# Patient Record
Sex: Female | Born: 1985 | Race: Black or African American | Hispanic: No | Marital: Married | Smoking: Never smoker
Health system: Southern US, Community
[De-identification: ages and names within clinical notes are randomized; demographics above are authoritative.]

## PROBLEM LIST (undated history)

## (undated) ENCOUNTER — Inpatient Hospital Stay (HOSPITAL_COMMUNITY): Payer: Self-pay

## (undated) DIAGNOSIS — F32A Depression, unspecified: Secondary | ICD-10-CM

## (undated) DIAGNOSIS — F209 Schizophrenia, unspecified: Secondary | ICD-10-CM

## (undated) DIAGNOSIS — D649 Anemia, unspecified: Secondary | ICD-10-CM

## (undated) DIAGNOSIS — F329 Major depressive disorder, single episode, unspecified: Secondary | ICD-10-CM

## (undated) HISTORY — PX: NO PAST SURGERIES: SHX2092

---

## 2015-06-01 NOTE — L&D Delivery Note (Signed)
Delivery Note At 8:07 AM a viable female was delivered via Vaginal, Spontaneous Delivery (Presentation: vertex; LOA).  APGAR: 9, 9; weight pending.   Placenta status: spontaneous, intact.  Cord: 3VC with the following complications: none.  Anesthesia:   Episiotomy: None Lacerations: 1st degree Suture Repair: 3.0 vicryl Est. Blood Loss (mL): 150  Mom to postpartum.  Baby to Couplet care / Skin to Skin.  STINSON, JACOB JEHIEL 04/20/2016, 8:41 AM

## 2015-08-15 ENCOUNTER — Inpatient Hospital Stay (HOSPITAL_COMMUNITY)
Admission: AD | Admit: 2015-08-15 | Discharge: 2015-08-16 | Disposition: A | Discharge status: Home / Self Care | Payer: Medicaid Other | Source: Ambulatory Visit | Attending: Obstetrics & Gynecology | Admitting: Obstetrics & Gynecology

## 2015-08-15 ENCOUNTER — Ambulatory Visit (INDEPENDENT_AMBULATORY_CARE_PROVIDER_SITE_OTHER): Payer: Self-pay | Admitting: Physician Assistant

## 2015-08-15 ENCOUNTER — Inpatient Hospital Stay (HOSPITAL_COMMUNITY): Payer: Medicaid Other

## 2015-08-15 ENCOUNTER — Encounter (HOSPITAL_COMMUNITY): Payer: Self-pay | Admitting: *Deleted

## 2015-08-15 VITALS — BP 120/80 | HR 105 | Temp 99.0°F | Resp 16 | Ht 62.0 in | Wt 122.0 lb

## 2015-08-15 DIAGNOSIS — O99341 Other mental disorders complicating pregnancy, first trimester: Secondary | ICD-10-CM | POA: Diagnosis not present

## 2015-08-15 DIAGNOSIS — R109 Unspecified abdominal pain: Secondary | ICD-10-CM | POA: Insufficient documentation

## 2015-08-15 DIAGNOSIS — F209 Schizophrenia, unspecified: Secondary | ICD-10-CM | POA: Diagnosis not present

## 2015-08-15 DIAGNOSIS — O0000 Abdominal pregnancy without intrauterine pregnancy: Secondary | ICD-10-CM

## 2015-08-15 DIAGNOSIS — O26891 Other specified pregnancy related conditions, first trimester: Secondary | ICD-10-CM | POA: Insufficient documentation

## 2015-08-15 DIAGNOSIS — F458 Other somatoform disorders: Secondary | ICD-10-CM

## 2015-08-15 DIAGNOSIS — Z3A01 Less than 8 weeks gestation of pregnancy: Secondary | ICD-10-CM | POA: Diagnosis not present

## 2015-08-15 DIAGNOSIS — O3680X0 Pregnancy with inconclusive fetal viability, not applicable or unspecified: Secondary | ICD-10-CM

## 2015-08-15 DIAGNOSIS — R319 Hematuria, unspecified: Secondary | ICD-10-CM

## 2015-08-15 DIAGNOSIS — R1084 Generalized abdominal pain: Secondary | ICD-10-CM

## 2015-08-15 DIAGNOSIS — N912 Amenorrhea, unspecified: Secondary | ICD-10-CM

## 2015-08-15 DIAGNOSIS — O26899 Other specified pregnancy related conditions, unspecified trimester: Secondary | ICD-10-CM

## 2015-08-15 HISTORY — DX: Anemia, unspecified: D64.9

## 2015-08-15 HISTORY — DX: Major depressive disorder, single episode, unspecified: F32.9

## 2015-08-15 HISTORY — DX: Depression, unspecified: F32.A

## 2015-08-15 HISTORY — DX: Schizophrenia, unspecified: F20.9

## 2015-08-15 LAB — CBC
HEMATOCRIT: 36.9 % (ref 36.0–46.0)
HEMOGLOBIN: 12.7 g/dL (ref 12.0–15.0)
MCH: 31.6 pg (ref 26.0–34.0)
MCHC: 34.4 g/dL (ref 30.0–36.0)
MCV: 91.8 fL (ref 78.0–100.0)
Platelets: 299 10*3/uL (ref 150–400)
RBC: 4.02 MIL/uL (ref 3.87–5.11)
RDW: 12.1 % (ref 11.5–15.5)
WBC: 16.2 10*3/uL — ABNORMAL HIGH (ref 4.0–10.5)

## 2015-08-15 LAB — POCT URINALYSIS DIP (MANUAL ENTRY)
BILIRUBIN UA: NEGATIVE
Blood, UA: NEGATIVE
GLUCOSE UA: NEGATIVE
Ketones, POC UA: NEGATIVE
LEUKOCYTES UA: NEGATIVE
NITRITE UA: NEGATIVE
Protein Ur, POC: NEGATIVE
Spec Grav, UA: 1.015
UROBILINOGEN UA: 0.2
pH, UA: 7.5

## 2015-08-15 LAB — POC MICROSCOPIC URINALYSIS (UMFC): MUCUS RE: ABSENT

## 2015-08-15 LAB — WET PREP, GENITAL
Clue Cells Wet Prep HPF POC: NONE SEEN
SPERM: NONE SEEN
Trich, Wet Prep: NONE SEEN
Yeast Wet Prep HPF POC: NONE SEEN

## 2015-08-15 LAB — POCT URINE PREGNANCY: PREG TEST UR: POSITIVE — AB

## 2015-08-15 LAB — HCG, QUANTITATIVE, PREGNANCY: hCG, Beta Chain, Quant, S: 1331 m[IU]/mL — ABNORMAL HIGH (ref <5)

## 2015-08-15 NOTE — Progress Notes (Signed)
Amy Mcclain  MRN: [id number] DOB: 1985-10-24  Subjective:  Pt presents to clinic for a pregnancy test - she is with her husband and friend to interpret. She has a pregnancy test that showed a line at home.  She thinks she is pregnant and wants proof.  Remaining history was obtained through use of an interpretor service provided by our office.  States 18 months of irregular menstrual periods. Diarrhea started 10 days ago with self reported fever and suprapubic abdominal pain started 5 days ago with some nausea. Denies bright red blood per rectum. Temperature not taken at home. Denies dysuria, endorses polyuria. Denies sick contacts at home. Endorses blood and pus in urine at the start of the stream.  No one is sick at home with similar symptoms. Uses no pregnancy protection. Has chronic back pain that has not changed in the last days 10 days.  Pt believes she has been pregnant for 18 months and she has been trying to deliver for the last 5 days - she feels like the baby is in water.  She only wants to go to a public hospital because they do not know what they are doing at a private hospital and if I send her there she will leave.  LMP - 2/16  [id number] - 2010 C-section (healthyl 30 year old at home), 2013 spontaneous abortion at 6 months along  Used interpretor service which had a hard time with the patient's dialect.  Husband produces papers to show she was at [facility] on 3/14 and had a diagnosis of mental illness and schizophrenia.  She is on no medications at this time.  There are no active problems to display for this patient.   No current outpatient prescriptions on file prior to visit.   No current facility-administered medications on file prior to visit.    No Known Allergies  Review of Systems  Constitutional: Positive for fever (subjective). Negative for chills.  Gastrointestinal: Positive for abdominal pain.  Genitourinary: Positive for pelvic pain. Negative for dysuria and  vaginal discharge.  Musculoskeletal: Positive for back pain (chronic).   Objective:  BP 120/80 mmHg  Pulse 105  Temp(Src) 99 F (37.2 C) (Oral)  Resp 16  Ht  (1.575 m)  Wt 122 lb (55.339 kg)  BMI 22.31 kg/m2  SpO2 98%  Physical Exam  Constitutional: She is well-developed, well-nourished, and in no distress.  HENT:  Head: Normocephalic and atraumatic.  Right Ear: External ear normal.  Eyes: Conjunctivae are normal.  Cardiovascular: Normal rate, regular rhythm and normal heart sounds.   No murmur heard. Pulmonary/Chest: Effort normal and breath sounds normal. She has no wheezes.  Abdominal: Soft. Bowel sounds are normal. There is tenderness in the right lower quadrant, suprapubic area, left upper quadrant and left lower quadrant. There is no rigidity, no rebound, no guarding, no CVA tenderness and negative Murphy's sign.  Genitourinary: Vagina normal, uterus normal, cervix normal, right adnexa normal, left adnexa normal and vulva normal. Vagina exhibits normal mucosa. No vaginal discharge found.  Closed long cervix - when pt was distracted she had no CMT - no TTP during bimanual exam -     Results for orders placed or performed in visit on 08/15/15  POCT urine pregnancy  Result Value Ref Range   Preg Test, Ur Positive (A) Negative  POCT Microscopic Urinalysis ([facility])  Result Value Ref Range   WBC,UR,HPF,POC None None WBC/hpf   RBC,UR,HPF,POC None None RBC/hpf   Bacteria None None, Too numerous to count  Mucus Absent Absent   Epithelial Cells, UR Per Microscopy None None, Too numerous to count cells/hpf  POCT urinalysis dipstick  Result Value Ref Range   Color, UA yellow yellow   Clarity, UA clear clear   Glucose, UA negative negative   Bilirubin, UA negative negative   Ketones, POC UA negative negative   Spec Grav, UA 1.015    Blood, UA negative negative   pH, UA 7.5    Protein Ur, POC negative negative   Urobilinogen, UA 0.2    Nitrite, UA Negative Negative    Leukocytes, UA Negative Negative     Assessment and Plan :  Amenorrhea - Plan: POCT urinalysis dipstick  Blood in urine - Plan: POCT urine pregnancy, POCT Microscopic Urinalysis ([facility])  Generalized abdominal pain  Abdominal pregnancy, unspecified whether intrauterine pregnancy present  Delusion of pregnancy   [facility] - I question whether 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  She likely needs a dating US and lab work to determine her current situation further.  [facility]  Benny LennertSarah Weber PA-C  [facility] 08/15/2015 1:35 PM

## 2015-08-15 NOTE — MAU Note (Signed)
I have cramping in abdomen and pain in back for 5 days. Brownish vag discharge.

## 2015-08-15 NOTE — Patient Instructions (Addendum)
Please drive to the hospital now:  [facility] [address], [place], [place] [address] [phone]       [phone] [id number]  [address] Man[address] mus[address], [place], [place] [address]      IF you received an x-ray today, you will receive an invoice from [facility]. Please contact [facility] at [phone] with questions or concerns regarding your invoice.   IF you received labwork today, you will receive an invoice from [facility]. Please contact [facility] at [phone] with questions or concerns regarding your invoice.   Our billing staff will not be able to assist you with questions regarding bills from these companies.  You will be contacted with the lab results as soon as they are available. The fastest way to get your results is to activate your My Chart account. Instructions are located on the last page of this paperwork. If you have not heard from us regarding the results in 2 weeks, please contact this office.

## 2015-08-16 ENCOUNTER — Encounter (HOSPITAL_COMMUNITY): Payer: Self-pay | Admitting: Advanced Practice Midwife

## 2015-08-16 DIAGNOSIS — O26891 Other specified pregnancy related conditions, first trimester: Secondary | ICD-10-CM | POA: Diagnosis not present

## 2015-08-16 DIAGNOSIS — F209 Schizophrenia, unspecified: Secondary | ICD-10-CM | POA: Diagnosis present

## 2015-08-16 DIAGNOSIS — O9989 Other specified diseases and conditions complicating pregnancy, childbirth and the puerperium: Secondary | ICD-10-CM

## 2015-08-16 DIAGNOSIS — R109 Unspecified abdominal pain: Secondary | ICD-10-CM

## 2015-08-16 LAB — HIV ANTIBODY (ROUTINE TESTING W REFLEX): HIV SCREEN 4TH GENERATION: NONREACTIVE

## 2015-08-16 LAB — ABO/RH: ABO/RH(D): A POS

## 2015-08-16 MED ORDER — OXYCODONE-ACETAMINOPHEN 5-325 MG PO TABS
1.0000 | ORAL_TABLET | Freq: Once | ORAL | Status: AC
  Administered 2015-08-16: 1 via ORAL
  Filled 2015-08-16: qty 1

## 2015-08-16 NOTE — MAU Provider Note (Signed)
Chief Complaint: No chief complaint on file.   First Provider Initiated Contact with Patient 08/16/15 0011     SUBJECTIVE HPI: Amy Mcclain is a 30 y.o. [id number] at 10112w2d by LMP who presents to Maternity Admissions reporting low abd pain. Had positive pregnancy test and at Urgent Care tonight. See note. Pt states she is 18 months pregnancy, is in labor and was told that her cervix was open. States that she feels fetal movement. Her husband is at the bedside and states she has psychological problems and if off of her medicines.   Location: Low abd and right kidney (points to left low abd) Quality: "labor pains" Severity: Moderate Duration: 5 days Timing: intermittent Associated signs and symptoms: Gives changing reports of no bleeding since LMP, then reports that she did have some bleeding since then. None now. States she had blood in her urine severel days ago.    Pt gives very inconsistent Hx.   [place] Arabic interpreter used.   Past Medical History  Diagnosis Date  . Anemia   . Depression    OB History  Gravida Para Term Preterm AB SAB TAB Ectopic Multiple Living  4 3 2 1  0 0  0 0 2    # Outcome Date GA Lbr Len/2nd Weight Sex Delivery Anes PTL Lv  4 Current           3 Preterm         FD  2 Term           1 Term              History reviewed. No pertinent past surgical history. Social History   Social History  . Marital Status: Married    Spouse Name: N/A  . Number of Children: N/A  . Years of Education: N/A   Occupational History  . Not on file.   Social History Main Topics  . Smoking status: Never Smoker   . Smokeless tobacco: Never Used  . Alcohol Use: No  . Drug Use: No  . Sexual Activity: Yes   Other Topics Concern  . Not on file   Social History Narrative   No current facility-administered medications on file prior to encounter.   No current outpatient prescriptions on file prior to encounter.   Not on File  I have reviewed the past Medical Hx,  Surgical Hx, Social Hx, Allergies and Medications.   Review of Systems  Unable to perform ROS: Psychiatric disorder (Limited by )  Gastrointestinal: Positive for abdominal pain.  Genitourinary: Positive for hematuria and pelvic pain. Negative for vaginal bleeding.  Musculoskeletal: Positive for back pain.    OBJECTIVE Patient Vitals for the past 24 hrs:  BP Temp Pulse Resp Height Weight  08/15/15 2009 122/74 mmHg - 119 - - -  08/15/15 2001 - 98.3 F (36.8 C) - 18 5' 1.5" (1.562 m) 122 lb 3.2 oz (55.43 kg)   Constitutional: Well-developed, well-nourished female in no acute distress.  Cardiovascular: normal rate Respiratory: normal rate and effort.  GI: Abd soft, non-tender. Pos BS x 4 MS: Extremities nontender, no edema, normal ROM Neurologic: Alert and oriented x 4.  GU: Neg CVAT.  BIMANUAL EXAM: NEFG, physiologic discharge, no blood noted, cervix closed. Uterus normal size. No adnexal tenderness or masses. No CMT.  LAB RESULTS Results for orders placed or performed during the hospital encounter of 08/15/15 (from the past 24 hour(s))  hCG, quantitative, pregnancy     Status: Abnormal   Collection Time:  08/15/15  8:57 PM  Result Value Ref Range   hCG, Beta Chain, Quant, S 1331 (H) <5 mIU/mL  CBC     Status: Abnormal   Collection Time: 08/15/15  8:57 PM  Result Value Ref Range   WBC 16.2 (H) 4.0 - 10.5 K/uL   RBC 4.02 3.87 - 5.11 MIL/uL   Hemoglobin 12.7 12.0 - 15.0 g/dL   HCT 16.1 09.6 - 04.5 %   MCV 91.8 78.0 - 100.0 fL   MCH 31.6 26.0 - 34.0 pg   MCHC 34.4 30.0 - 36.0 g/dL   RDW 40.9 81.1 - 91.4 %   Platelets 299 150 - 400 K/uL  ABO/Rh     Status: None   Collection Time: 08/15/15  8:57 PM  Result Value Ref Range   ABO/RH(D) A POS   Wet prep, genital     Status: Abnormal   Collection Time: 08/15/15 11:41 PM  Result Value Ref Range   Yeast Wet Prep HPF POC NONE SEEN NONE SEEN   Trich, Wet Prep NONE SEEN NONE SEEN   Clue Cells Wet Prep HPF POC NONE SEEN NONE SEEN    WBC, Wet Prep HPF POC MODERATE (A) NONE SEEN   Sperm NONE SEEN     IMAGING US Ob Comp Less 14 Wks  08/15/2015  CLINICAL DATA:  Abdomen pain and back pain.  Vaginal discharge. EXAM: OBSTETRIC <14 WK [place] AND TRANSVAGINAL OB US TECHNIQUE: Both transabdominal and transvaginal ultrasound examinations were performed for complete evaluation of the gestation as well as the maternal uterus, adnexal regions, and pelvic cul-de-sac. Transvaginal technique was performed to assess early pregnancy. COMPARISON:  None. FINDINGS: Intrauterine gestational sac: Tiny intrauterine sac, too small to characterize. Yolk sac:  No Embryo:  No Cardiac Activity: No Heart Rate:   bpm MSD: 2.1  mm   4 w   6  d CRL:    mm    w    d                  [place] EDC: Subchorionic hemorrhage:  None visualized. Maternal uterus/adnexae: Normal. Probable corpus luteal cyst on the right. IMPRESSION: 2 mm intrauterine sac, too small to characterize. No yolk sac, fetal pole, or cardiac activity yet visualized. Recommend follow-up quantitative B-HCG levels and follow-up US in 14 days to confirm and assess viability. This recommendation follows SRU consensus guidelines: Diagnostic Criteria for Nonviable Pregnancy Early in the First Trimester. Malva Limes Med 2013; 782:9562-13. Electronically Signed   By: Ellery Plunk M.D.   On: 08/15/2015 23:12   US Ob Transvaginal  08/15/2015  CLINICAL DATA:  Abdomen pain and back pain.  Vaginal discharge. EXAM: OBSTETRIC <14 WK [place] AND TRANSVAGINAL OB US TECHNIQUE: Both transabdominal and transvaginal ultrasound examinations were performed for complete evaluation of the gestation as well as the maternal uterus, adnexal regions, and pelvic cul-de-sac. Transvaginal technique was performed to assess early pregnancy. COMPARISON:  None. FINDINGS: Intrauterine gestational sac: Tiny intrauterine sac, too small to characterize. Yolk sac:  No Embryo:  No Cardiac Activity: No Heart Rate:   bpm MSD: 2.1  mm   4 w   6  d CRL:    mm     w    d                  [place] EDC: Subchorionic hemorrhage:  None visualized. Maternal uterus/adnexae: Normal. Probable corpus luteal cyst on the right. IMPRESSION: 2 mm intrauterine sac, too small to  characterize. No yolk sac, fetal pole, or cardiac activity yet visualized. Recommend follow-up quantitative B-HCG levels and follow-up US in 14 days to confirm and assess viability. This recommendation follows SRU consensus guidelines: Diagnostic Criteria for Nonviable Pregnancy Early in the First Trimester. Malva Limes Med 2013; 782:9562-13. Electronically Signed   By: Ellery Plunk M.D.   On: 08/15/2015 23:12    MAU COURSE CBC, Quant, ABO/Rh, ultrasound, wet prep and GC/chlamydia culture, UA  MDM - Pain and bleeding in early pregnancy with pregnancy of unknown anatomic location, but hemodynamically stable.  - UnTx'd schizophrenia. No danger to self or others.   ASSESSMENT 1. Pregnancy of unknown anatomic location   2. Abdominal pain affecting pregnancy, antepartum     PLAN Discharge home in stable condition w/ husband who is deemed reliable to bring pt back for F/U labs or emergencies. . Ectopic and SAB precautions. Has appt scheduled for [facility] on 08/18/15 for psychiatric care.  Follow-up Information    Follow up with [facility] On 08/17/2015.   Why:  For repeat blood work or sooner as if symptoms worsen   Contact information:   [address] [id number] mc [place] [address] [phone]       Medication List    TAKE these medications        folic acid 400 MCG tablet  Commonly known as:  FOLVITE  Take 400 mcg by mouth daily.       [place], CNM 08/16/2015  12:51 AM  4

## 2015-08-16 NOTE — Discharge Instructions (Signed)

## 2015-08-16 NOTE — MAU Note (Signed)
Pt feeling better up walking in room. Agreeable to get dressed and go home. Instructed Husband to bring her back on Sunday for blood work or sooner for increased pain or if she starts to have vag bleeding.

## 2015-08-16 NOTE — MAU Note (Signed)
Pt still not wanting to go home. Had phone interpurter explain again why she needed to go home and come back in 2 days for blood work to check hormone level and pregnancy. Pt still insisting that she is 18 month pregnant and does not want to go home because of the pains she is having may come back.  Wants to know why we cannot tell her why she is having pain. Tried to explain that what we see so far is ok but we need further test and studies to make sure. Pt still not wanting to go home and then started to make her self vomit.  Notified provider of pts statements.

## 2015-08-17 ENCOUNTER — Inpatient Hospital Stay (HOSPITAL_COMMUNITY)
Admission: AD | Admit: 2015-08-17 | Discharge: 2015-08-17 | Disposition: A | Discharge status: Home / Self Care | Payer: Medicaid Other | Source: Ambulatory Visit | Attending: Obstetrics and Gynecology | Admitting: Obstetrics and Gynecology

## 2015-08-17 DIAGNOSIS — O3680X0 Pregnancy with inconclusive fetal viability, not applicable or unspecified: Secondary | ICD-10-CM

## 2015-08-17 DIAGNOSIS — O99341 Other mental disorders complicating pregnancy, first trimester: Secondary | ICD-10-CM | POA: Insufficient documentation

## 2015-08-17 DIAGNOSIS — O26891 Other specified pregnancy related conditions, first trimester: Secondary | ICD-10-CM | POA: Insufficient documentation

## 2015-08-17 DIAGNOSIS — R109 Unspecified abdominal pain: Secondary | ICD-10-CM

## 2015-08-17 DIAGNOSIS — Z3A01 Less than 8 weeks gestation of pregnancy: Secondary | ICD-10-CM | POA: Diagnosis not present

## 2015-08-17 DIAGNOSIS — F329 Major depressive disorder, single episode, unspecified: Secondary | ICD-10-CM | POA: Insufficient documentation

## 2015-08-17 DIAGNOSIS — O9989 Other specified diseases and conditions complicating pregnancy, childbirth and the puerperium: Secondary | ICD-10-CM

## 2015-08-17 LAB — HCG, QUANTITATIVE, PREGNANCY: HCG, BETA CHAIN, QUANT, S: 3031 m[IU]/mL — AB (ref <5)

## 2015-08-17 NOTE — MAU Note (Signed)
Pt presents for follow up HCG

## 2015-08-17 NOTE — MAU Provider Note (Signed)
History   [id number]   No chief complaint on file.   HPI Amy [id number] Amy Mcclain is a 30 y.o. female [phone] here for follow-up BHCG.  Upon review of the records patient was first seen on 08/15/15 for lower abdominal pain.   BHCG on that day was 1331.  Ultrasound showed IUGS, no yolk sac or fetal pole.  Wet prep collected.  Results were negative.  Pt here today with no report of abdominal pain or vaginal bleeding.   All other systems negative.   Patient's last menstrual period was 07/17/2015.  OB History  Gravida Para Term Preterm AB SAB TAB Ectopic Multiple Living  4 3 2 1  0 0  0 0 2    # Outcome Date GA Lbr Len/2nd Weight Sex Delivery Anes PTL Lv  4 Current           3 Preterm         FD  2 Term           1 Term               Past Medical History  Diagnosis Date  . Anemia   . Depression   . Schizophrenia (HCC)     No family history on file.  Social History   Social History  . Marital Status: Married    Spouse Name: N/A  . Number of Children: N/A  . Years of Education: N/A   Social History Main Topics  . Smoking status: Never Smoker   . Smokeless tobacco: Never Used  . Alcohol Use: No  . Drug Use: No  . Sexual Activity: Yes   Other Topics Concern  . Not on file   Social History Narrative    Not on File  No current facility-administered medications on file prior to encounter.   Current Outpatient Prescriptions on File Prior to Encounter  Medication Sig Dispense Refill  . folic acid (FOLVITE) 400 MCG tablet Take 400 mcg by mouth daily.       Physical Exam   There were no vitals filed for this visit.  Physical Exam  Constitutional: She is oriented to person, place, and time. She appears well-developed and well-nourished. No distress.  HENT:  Head: Normocephalic.  Neck: Neck supple.  Respiratory: Effort normal and breath sounds normal.  Neurological: She is alert and oriented to person, place, and time. She has normal reflexes.  Skin: Skin is warm and dry.   Psychiatric: She has a normal mood and affect.    MAU Course  Procedures  MDM Results for orders placed or performed during the hospital encounter of 08/17/15 (from the past 24 hour(s))  hCG, quantitative, pregnancy     Status: Abnormal   Collection Time: 08/17/15  8:06 PM  Result Value Ref Range   hCG, Beta Chain, Quant, S 3031 (H) <5 mIU/mL     Assessment and Plan  29 y.o. [name] at 6056w3d wks Pregnancy Follow-up BHCG Pregnancy of Unknown Location  Plan: Discharge to home Follow-up ultrasound in 10 days Ectopic precautions given All information conveyed via pacific interpreter line  Marlis EdelsonWalidah N Karim, CNM 08/17/2015 10:00 PM

## 2015-08-18 ENCOUNTER — Encounter (HOSPITAL_COMMUNITY): Payer: Self-pay | Admitting: *Deleted

## 2015-08-18 ENCOUNTER — Emergency Department (HOSPITAL_COMMUNITY)
Admission: EM | Admit: 2015-08-18 | Discharge: 2015-08-20 | Payer: Medicaid Other | Attending: Emergency Medicine | Admitting: Emergency Medicine

## 2015-08-18 DIAGNOSIS — Z3201 Encounter for pregnancy test, result positive: Secondary | ICD-10-CM

## 2015-08-18 DIAGNOSIS — O99341 Other mental disorders complicating pregnancy, first trimester: Secondary | ICD-10-CM | POA: Insufficient documentation

## 2015-08-18 DIAGNOSIS — O9989 Other specified diseases and conditions complicating pregnancy, childbirth and the puerperium: Secondary | ICD-10-CM | POA: Diagnosis present

## 2015-08-18 DIAGNOSIS — O99011 Anemia complicating pregnancy, first trimester: Secondary | ICD-10-CM | POA: Insufficient documentation

## 2015-08-18 DIAGNOSIS — R1032 Left lower quadrant pain: Secondary | ICD-10-CM | POA: Diagnosis not present

## 2015-08-18 DIAGNOSIS — Z0289 Encounter for other administrative examinations: Secondary | ICD-10-CM | POA: Diagnosis not present

## 2015-08-18 DIAGNOSIS — Z79899 Other long term (current) drug therapy: Secondary | ICD-10-CM | POA: Insufficient documentation

## 2015-08-18 DIAGNOSIS — Z046 Encounter for general psychiatric examination, requested by authority: Secondary | ICD-10-CM

## 2015-08-18 DIAGNOSIS — F22 Delusional disorders: Secondary | ICD-10-CM | POA: Insufficient documentation

## 2015-08-18 DIAGNOSIS — R1013 Epigastric pain: Secondary | ICD-10-CM | POA: Insufficient documentation

## 2015-08-18 DIAGNOSIS — R451 Restlessness and agitation: Secondary | ICD-10-CM | POA: Diagnosis not present

## 2015-08-18 DIAGNOSIS — F333 Major depressive disorder, recurrent, severe with psychotic symptoms: Secondary | ICD-10-CM | POA: Diagnosis present

## 2015-08-18 DIAGNOSIS — Z3A01 Less than 8 weeks gestation of pregnancy: Secondary | ICD-10-CM | POA: Diagnosis not present

## 2015-08-18 LAB — CBC WITH DIFFERENTIAL/PLATELET
BASOS ABS: 0.1 10*3/uL (ref 0.0–0.1)
BASOS PCT: 0 %
EOS PCT: 1 %
Eosinophils Absolute: 0.1 10*3/uL (ref 0.0–0.7)
HCT: 36 % (ref 36.0–46.0)
Hemoglobin: 12.4 g/dL (ref 12.0–15.0)
LYMPHS PCT: 16 %
Lymphs Abs: 2.3 10*3/uL (ref 0.7–4.0)
MCH: 31.1 pg (ref 26.0–34.0)
MCHC: 34.4 g/dL (ref 30.0–36.0)
MCV: 90.2 fL (ref 78.0–100.0)
Monocytes Absolute: 0.8 10*3/uL (ref 0.1–1.0)
Monocytes Relative: 5 %
Neutro Abs: 11.6 10*3/uL — ABNORMAL HIGH (ref 1.7–7.7)
Neutrophils Relative %: 78 %
PLATELETS: 268 10*3/uL (ref 150–400)
RBC: 3.99 MIL/uL (ref 3.87–5.11)
RDW: 11.7 % (ref 11.5–15.5)
WBC: 14.9 10*3/uL — AB (ref 4.0–10.5)

## 2015-08-18 LAB — SALICYLATE LEVEL

## 2015-08-18 LAB — COMPREHENSIVE METABOLIC PANEL
ALBUMIN: 4.2 g/dL (ref 3.5–5.0)
ALT: 18 U/L (ref 14–54)
AST: 19 U/L (ref 15–41)
Alkaline Phosphatase: 48 U/L (ref 38–126)
Anion gap: 9 (ref 5–15)
BUN: 12 mg/dL (ref 6–20)
CHLORIDE: 107 mmol/L (ref 101–111)
CO2: 22 mmol/L (ref 22–32)
CREATININE: 0.54 mg/dL (ref 0.44–1.00)
Calcium: 9.1 mg/dL (ref 8.9–10.3)
GFR calc Af Amer: >60 mL/min (ref >60)
GLUCOSE: 111 mg/dL — AB (ref 65–99)
Potassium: 4.3 mmol/L (ref 3.5–5.1)
SODIUM: 138 mmol/L (ref 135–145)
Total Bilirubin: 0.4 mg/dL (ref 0.3–1.2)
Total Protein: 7.3 g/dL (ref 6.5–8.1)

## 2015-08-18 LAB — HCG, QUANTITATIVE, PREGNANCY: hCG, Beta Chain, Quant, S: 3621 m[IU]/mL — ABNORMAL HIGH (ref <5)

## 2015-08-18 LAB — GC/CHLAMYDIA PROBE AMP (~~LOC~~) NOT AT ARMC
CHLAMYDIA, DNA PROBE: NEGATIVE
NEISSERIA GONORRHEA: NEGATIVE

## 2015-08-18 LAB — ACETAMINOPHEN LEVEL: Acetaminophen (Tylenol), Serum: <10 ug/mL — ABNORMAL LOW (ref 10–30)

## 2015-08-18 LAB — ETHANOL: Alcohol, Ethyl (B): <5 mg/dL (ref <5)

## 2015-08-18 MED ORDER — HALOPERIDOL 1 MG PO TABS: 1.0000 mg | ORAL_TABLET | Freq: Every day | ORAL | Status: DC

## 2015-08-18 MED ORDER — METOCLOPRAMIDE HCL 10 MG PO TABS: 10.0000 mg | ORAL_TABLET | Freq: Three times a day (TID) | ORAL | Status: DC | PRN

## 2015-08-18 MED ORDER — ACETAMINOPHEN 325 MG PO TABS: 650.0000 mg | ORAL_TABLET | ORAL | Status: DC | PRN

## 2015-08-18 MED ORDER — TRAZODONE HCL 50 MG PO TABS: 50.0000 mg | ORAL_TABLET | Freq: Every day | ORAL | Status: DC

## 2015-08-18 MED ORDER — FLUOXETINE HCL 10 MG PO CAPS
10.0000 mg | ORAL_CAPSULE | Freq: Every day | ORAL | Status: DC
  Filled 2015-08-18: qty 1

## 2015-08-18 NOTE — ED Notes (Signed)
Ford in with pt & linked with [employer] for interpretation purposes.
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
## 2015-08-18 NOTE — ED Notes (Signed)
Per lab patient urine spilled, requires re collect.

## 2015-08-18 NOTE — ED Notes (Signed)
Pt was placed in paper scrubs, provided OR bonnet for head covering, possessions bagged & labeled by Kendal HymenBonnie.  Security called to confirm contents.

## 2015-08-18 NOTE — ED Notes (Signed)
Patient has refused medications today.  Husband visited for a short time and states she was on fluoxetine at one time and she did very well when she took it all the time, but she has not been on it for a long time.  She has stayed in her room most of the day coming out only to use the bathroom.

## 2015-08-18 NOTE — ED Notes (Signed)
Patient alert. Patient denies pain. She speaks no [place] and reads very little. Unable to communicate with staff. She is able to communicate simple things to staff. Patient made aware of my name and that I was her nurse. Q 15 minute checks in progress and maintained. Monitoring continues.
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
## 2015-08-18 NOTE — ED Notes (Signed)
Bed: [id number] Expected date:  Expected time:  Means of arrival:  Comments: Tr3 
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
## 2015-08-18 NOTE — ED Notes (Signed)
TTS completed. 

## 2015-08-18 NOTE — ED Notes (Signed)
Pt has 2 living children, ages 810 & 8.

## 2015-08-18 NOTE — BH Assessment (Signed)
[facility] Assessment Progress Note  The following facilities have been contacted to seek placement for this pt, with results as noted:  Beds available, information sent, decision pending:   [facility]   At capacity:  [facility]   Kaiser Belluomini, MA Triage Specialist [phone]     

## 2015-08-19 DIAGNOSIS — F333 Major depressive disorder, recurrent, severe with psychotic symptoms: Secondary | ICD-10-CM | POA: Diagnosis not present

## 2015-08-19 DIAGNOSIS — Z046 Encounter for general psychiatric examination, requested by authority: Secondary | ICD-10-CM | POA: Insufficient documentation

## 2015-08-19 MED ORDER — FLUOXETINE HCL 10 MG PO CAPS
10.0000 mg | ORAL_CAPSULE | Freq: Every day | ORAL | Status: DC
  Administered 2015-08-19 – 2015-08-20 (×2): 10 mg via ORAL
  Filled 2015-08-19 (×2): qty 1

## 2015-08-19 MED ORDER — HYDROXYZINE HCL 25 MG PO TABS: 25.0000 mg | ORAL_TABLET | Freq: Three times a day (TID) | ORAL | Status: DC | PRN
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
## 2015-08-19 NOTE — ED Notes (Addendum)
Pt. Alert and oriented, warm and dry, in no distress. Pt. Denies SI, HI, and AVH. Pt. Encouraged to let nursing staff know of any concerns or needs. Assessment preformed from phone interpreter.

## 2015-08-19 NOTE — ED Notes (Signed)
Pt on phone 

## 2015-08-19 NOTE — BH Assessment (Signed)
Writer [facility].  Writer spoke to the [facility] Nurse Jennifer.

## 2015-08-19 NOTE — BH Assessment (Signed)
[facility] Assessment Progress Note  The following facilities have been contacted to seek placement for this pt, with results as noted:  Beds available, information sent, decision pending:   [facility]   Declined:  [facility] (medical acuity)   At capacity:  [facility]   [place], [place] Triage Specialist [phone]

## 2015-08-19 NOTE — Progress Notes (Addendum)
CSW [facility]ed [facility].  1:12pm- CSW left message for Amy Mcclain, [facility] [phone] to see if referral for patient was received. Name and contact left for return call.  2:59pm- CSW received telephone call from Amy Mcclain, [facility] stating she received the referral faxed today on patient.   Amy Mcclain, LCSWA [phone] ED CSW 08/19/2015 11:51 AM

## 2015-08-19 NOTE — ED Notes (Signed)
Pt stays in her room and is cooperative, but has difficulty understanding English. With a translator and a doctor present, she said that she thinks that she was in labor and has been pregnant for 18 months. She denies SI/HI.

## 2015-08-20 ENCOUNTER — Inpatient Hospital Stay
Admission: RE | Admit: 2015-08-20 | Payer: No Typology Code available for payment source | Source: Intra-hospital | Admitting: Psychiatry

## 2015-08-20 LAB — RAPID URINE DRUG SCREEN, HOSP PERFORMED
Amphetamines: NOT DETECTED
Barbiturates: NOT DETECTED
Benzodiazepines: NOT DETECTED
COCAINE: NOT DETECTED
OPIATES: NOT DETECTED
TETRAHYDROCANNABINOL: NOT DETECTED

## 2015-08-20 LAB — URINALYSIS, ROUTINE W REFLEX MICROSCOPIC
GLUCOSE, UA: NEGATIVE mg/dL
Hgb urine dipstick: NEGATIVE
KETONES UR: NEGATIVE mg/dL
LEUKOCYTES UA: NEGATIVE
Nitrite: NEGATIVE
PH: 5.5 (ref 5.0–8.0)
Protein, ur: NEGATIVE mg/dL
Specific Gravity, Urine: 1.03 (ref 1.005–1.030)

## 2015-08-20 NOTE — Progress Notes (Signed)
8:7206am-  CSW spoke with Kerry DoryLaurie Gardner, [facility] [phone] regarding patient's referral status. She stated they were not expecting discharges today, however, CSW could call back at noon for an update.   Elenore PaddyLaVonia Demari Kropp, LCSWA [phone] ED CSW 08/20/2015 8:18 AM

## 2015-08-20 NOTE — Progress Notes (Signed)
12:08pm- CSW spoke with Kerry DoryLaurie Gardner, [facility] [phone] regarding patient's status. She stated patient would not be appropriate for the facility due to her psychosis.  Elenore PaddyLaVonia Vidya Bamford, LCSWA [phone] ED CSW 08/20/2015 2:33 PM

## 2015-08-20 NOTE — BH Assessment (Signed)
[facility] Assessment Progress Note  Per Thedore MinsMojeed Akintayo, MD, this pt continues to require psychiatric hospitalization.  At 12:23 Dorina HoyerJoanie from [facility] calls to report that pt has been accepted to their facility by Dr Earlene Plateravis to Rm 719.  Law enforcement is to take pt to Registration outside of the Emergency Department.  Please call report to [phone].  Dahlia ByesJosephine Onuoha, NP, concurs with this decision.  Pt's nurse, Dawnaly, has been notified.  Pt is under IVC and [facility]  Doylene Canninghomas Kdyn Vonbehren, MA Triage Specialist [phone]

## 2015-08-20 NOTE — ED Notes (Signed)
[facility].  [facility].  All belongings given to the transport team.  The interpreter machine was used to communicate with the patient and answer all questions for her.  She was also encouraged to call and notify her husband of where she was going.  She did get up and use the phone.

## 2015-08-20 NOTE — BH Assessment (Signed)
[facility].  Bed isn't available at this time. Therefore, unable to transfer at this time. [facility] TTS will call [facility] when bed is available and patient can transfer. Writer informed [facility] Staff (Toyka P.)

## 2015-08-20 NOTE — BH Assessment (Signed)
Reassessment:   Writer met with patient to complete a face to face assessment. Interpreter services utilized as this patient speaks Arabic. Patient denies SI, HI, and AVH's. Patient asking, "Why am I here and when can I go home". Patient sts, "I don't agree with having to stay here, please let me go be with my husband, and children". Writer explained to patient that the provider has continued to recommend inpatient treatment. She does not appear to be displaying any psychotic behaviors. She is not responding to internal stimuli. She is calm and cooperative.   Dr. Darleene Cleaver and Reginold Agent, NP recommend inpatient treatment. TTS to seek placement.  Patient referred to several facilities including [facility].

## 2015-09-03 ENCOUNTER — Inpatient Hospital Stay (HOSPITAL_COMMUNITY)
Admission: AD | Admit: 2015-09-03 | Discharge: 2015-09-03 | Disposition: A | Discharge status: Home / Self Care | Payer: Medicaid Other | Source: Ambulatory Visit | Attending: Obstetrics and Gynecology | Admitting: Obstetrics and Gynecology

## 2015-09-03 ENCOUNTER — Inpatient Hospital Stay (HOSPITAL_COMMUNITY): Payer: Medicaid Other

## 2015-09-03 ENCOUNTER — Encounter (HOSPITAL_COMMUNITY): Payer: Self-pay | Admitting: Advanced Practice Midwife

## 2015-09-03 DIAGNOSIS — Z3490 Encounter for supervision of normal pregnancy, unspecified, unspecified trimester: Secondary | ICD-10-CM

## 2015-09-03 DIAGNOSIS — Z3A01 Less than 8 weeks gestation of pregnancy: Secondary | ICD-10-CM | POA: Diagnosis not present

## 2015-09-03 DIAGNOSIS — R109 Unspecified abdominal pain: Secondary | ICD-10-CM | POA: Diagnosis not present

## 2015-09-03 DIAGNOSIS — R1030 Lower abdominal pain, unspecified: Secondary | ICD-10-CM

## 2015-09-03 DIAGNOSIS — O26891 Other specified pregnancy related conditions, first trimester: Secondary | ICD-10-CM | POA: Insufficient documentation

## 2015-09-03 DIAGNOSIS — O26899 Other specified pregnancy related conditions, unspecified trimester: Secondary | ICD-10-CM

## 2015-09-03 DIAGNOSIS — O9989 Other specified diseases and conditions complicating pregnancy, childbirth and the puerperium: Secondary | ICD-10-CM

## 2015-09-03 LAB — HCG, QUANTITATIVE, PREGNANCY: hCG, Beta Chain, Quant, S: 96193 m[IU]/mL — ABNORMAL HIGH (ref <5)

## 2015-09-03 NOTE — Discharge Instructions (Signed)
First Trimester of Pregnancy The first trimester of pregnancy is from week 1 until the end of week 12 (months 1 through 3). A week after a sperm fertilizes an egg, the egg will implant on the wall of the uterus. This embryo will begin to develop into a baby. Genes from you and your partner are forming the baby. The female genes determine whether the baby is a boy or a girl. At 6-8 weeks, the eyes and face are formed, and the heartbeat can be seen on ultrasound. At the end of 12 weeks, all the baby's organs are formed.  Now that you are pregnant, you will want to do everything you can to have a healthy baby. Two of the most important things are to get good prenatal care and to follow your health care provider's instructions. Prenatal care is all the medical care you receive before the baby's birth. This care will help prevent, find, and treat any problems during the pregnancy and childbirth. BODY CHANGES Your body goes through many changes during pregnancy. The changes vary from woman to woman.   You may gain or lose a couple of pounds at first.  You may feel sick to your stomach (nauseous) and throw up (vomit). If the vomiting is uncontrollable, call your health care provider.  You may tire easily.  You may develop headaches that can be relieved by medicines approved by your health care provider.  You may urinate more often. Painful urination may mean you have a bladder infection.  You may develop heartburn as a result of your pregnancy.  You may develop constipation because certain hormones are causing the muscles that push waste through your intestines to slow down.  You may develop hemorrhoids or swollen, bulging veins (varicose veins).  Your breasts may begin to grow larger and become tender. Your nipples may stick out more, and the tissue that surrounds them (areola) may become darker.  Your gums may bleed and may be sensitive to brushing and flossing.  Dark spots or blotches (chloasma,  mask of pregnancy) may develop on your face. This will likely fade after the baby is born.  Your menstrual periods will stop.  You may have a loss of appetite.  You may develop cravings for certain kinds of food.  You may have changes in your emotions from day to day, such as being excited to be pregnant or being concerned that something may go wrong with the pregnancy and baby.  You may have more vivid and strange dreams.  You may have changes in your hair. These can include thickening of your hair, rapid growth, and changes in texture. Some women also have hair loss during or after pregnancy, or hair that feels dry or thin. Your hair will most likely return to normal after your baby is born. WHAT TO EXPECT AT YOUR PRENATAL VISITS During a routine prenatal visit:  You will be weighed to make sure you and the baby are growing normally.  Your blood pressure will be taken.  Your abdomen will be measured to track your baby's growth.  The fetal heartbeat will be listened to starting around week 10 or 12 of your pregnancy.  Test results from any previous visits will be discussed. Your health care provider may ask you:  How you are feeling.  If you are feeling the baby move.  If you have had any abnormal symptoms, such as leaking fluid, bleeding, severe headaches, or abdominal cramping.  If you are using any tobacco products,   including cigarettes, chewing tobacco, and electronic cigarettes.  If you have any questions. Other tests that may be performed during your first trimester include:  Blood tests to find your blood type and to check for the presence of any previous infections. They will also be used to check for low iron levels (anemia) and Rh antibodies. Later in the pregnancy, blood tests for diabetes will be done along with other tests if problems develop.  Urine tests to check for infections, diabetes, or protein in the urine.  An ultrasound to confirm the proper growth  and development of the baby.  An amniocentesis to check for possible genetic problems.  Fetal screens for spina bifida and Down syndrome.  You may need other tests to make sure you and the baby are doing well.  HIV (human immunodeficiency virus) testing. Routine prenatal testing includes screening for HIV, unless you choose not to have this test. HOME CARE INSTRUCTIONS  Medicines  Follow your health care provider's instructions regarding medicine use. Specific medicines may be either safe or unsafe to take during pregnancy.  Take your prenatal vitamins as directed.  If you develop constipation, try taking a stool softener if your health care provider approves. Diet  Eat regular, well-balanced meals. Choose a variety of foods, such as meat or vegetable-based protein, fish, milk and low-fat dairy products, vegetables, fruits, and whole grain breads and cereals. Your health care provider will help you determine the amount of weight gain that is right for you.  Avoid raw meat and uncooked cheese. These carry germs that can cause birth defects in the baby.  Eating four or five small meals rather than three large meals a day may help relieve nausea and vomiting. If you start to feel nauseous, eating a few soda crackers can be helpful. Drinking liquids between meals instead of during meals also seems to help nausea and vomiting.  If you develop constipation, eat more high-fiber foods, such as fresh vegetables or fruit and whole grains. Drink enough fluids to keep your urine clear or pale yellow. Activity and Exercise  Exercise only as directed by your health care provider. Exercising will help you:  Control your weight.  Stay in shape.  Be prepared for labor and delivery.  Experiencing pain or cramping in the lower abdomen or low back is a good sign that you should stop exercising. Check with your health care provider before continuing normal exercises.  Try to avoid standing for long  periods of time. Move your legs often if you must stand in one place for a long time.  Avoid heavy lifting.  Wear low-heeled shoes, and practice good posture.  You may continue to have sex unless your health care provider directs you otherwise. Relief of Pain or Discomfort  Wear a good support bra for breast tenderness.   Take warm sitz baths to soothe any pain or discomfort caused by hemorrhoids. Use hemorrhoid cream if your health care provider approves.   Rest with your legs elevated if you have leg cramps or low back pain.  If you develop varicose veins in your legs, wear support hose. Elevate your feet for 15 minutes, 3-4 times a day. Limit salt in your diet. Prenatal Care  Schedule your prenatal visits by the twelfth week of pregnancy. They are usually scheduled monthly at first, then more often in the last 2 months before delivery.  Write down your questions. Take them to your prenatal visits.  Keep all your prenatal visits as directed by your   health care provider. Safety  Wear your seat belt at all times when driving.  Make a list of emergency phone numbers, including numbers for family, friends, the hospital, and police and fire departments. General Tips  Ask your health care provider for a referral to a local prenatal education class. Begin classes no later than at the beginning of month 6 of your pregnancy.  Ask for help if you have counseling or nutritional needs during pregnancy. Your health care provider can offer advice or refer you to specialists for help with various needs.  Do not use hot tubs, steam rooms, or saunas.  Do not douche or use tampons or scented sanitary pads.  Do not cross your legs for long periods of time.  Avoid cat litter boxes and soil used by cats. These carry germs that can cause birth defects in the baby and possibly loss of the fetus by miscarriage or stillbirth.  Avoid all smoking, herbs, alcohol, and medicines not prescribed by  your health care provider. Chemicals in these affect the formation and growth of the baby.  Do not use any tobacco products, including cigarettes, chewing tobacco, and electronic cigarettes. If you need help quitting, ask your health care provider. You may receive counseling support and other resources to help you quit.  Schedule a dentist appointment. At home, brush your teeth with a soft toothbrush and be gentle when you floss. SEEK MEDICAL CARE IF:   You have dizziness.  You have mild pelvic cramps, pelvic pressure, or nagging pain in the abdominal area.  You have persistent nausea, vomiting, or diarrhea.  You have a bad smelling vaginal discharge.  You have pain with urination.  You notice increased swelling in your face, hands, legs, or ankles. SEEK IMMEDIATE MEDICAL CARE IF:   You have a fever.  You are leaking fluid from your vagina.  You have spotting or bleeding from your vagina.  You have severe abdominal cramping or pain.  You have rapid weight gain or loss.  You vomit blood or material that looks like coffee grounds.  You are exposed to German measles and have never had them.  You are exposed to fifth disease or chickenpox.  You develop a severe headache.  You have shortness of breath.  You have any kind of trauma, such as from a fall or a car accident.   This information is not intended to replace advice given to you by your health care provider. Make sure you discuss any questions you have with your health care provider.   Document Released: 05/11/2001 Document Revised: 06/07/2014 Document Reviewed: 03/27/2013 Elsevier Interactive Patient Education 2016 [employer]  

## 2015-09-03 NOTE — MAU Provider Note (Signed)
History     CSN: [id number]  Arrival date and time: 09/03/15 1950   First Provider Initiated Contact with Patient 09/03/15 2014      No chief complaint on file.  HPI Comments: Amy Mcclain is a 30 y.o. [phone] at [redacted]w[redacted]d who present today for follow up lab work. She was here on 08/17/15 and was to have follow up HCG. However, she was admitted for inpatient psychiatric treatment, and did not follow up. She is here today for follow up. She denies any vaginal bleeding, but does have intermittent periumbilical pain.   Abdominal Pain This is a new problem. The current episode started 1 to 4 weeks ago. The problem occurs intermittently. The pain is located in the periumbilical region. The pain is at a severity of 5/10. The quality of the pain is cramping. The abdominal pain does not radiate. Pertinent negatives include no constipation, diarrhea, dysuria, fever, frequency, nausea or vomiting. Nothing aggravates the pain. The pain is relieved by nothing. She has tried nothing for the symptoms.    Past Medical History  Diagnosis Date  . Anemia   . Depression   . Schizophrenia (HCC)     No past surgical history on file.  No family history on file.  Social History  Substance Use Topics  . Smoking status: Never Smoker   . Smokeless tobacco: Never Used  . Alcohol Use: No    Allergies:  Allergies  Allergen Reactions  . Ceftin [Cefuroxime Axetil] Rash    bruising    Prescriptions prior to admission  Medication Sig Dispense Refill Last Dose  . folic acid (FOLVITE) 400 MCG tablet Take 400 mcg by mouth daily.   2 days ago    Review of Systems  Constitutional: Negative for fever and chills.  Gastrointestinal: Positive for abdominal pain. Negative for nausea, vomiting, diarrhea and constipation.  Genitourinary: Negative for dysuria, urgency and frequency.   Physical Exam   Blood pressure 110/57, pulse 75, temperature 98.2 F (36.8 C), temperature source Oral, resp. rate 16, height 5'  1.5" (1.562 m), weight 55.566 kg (122 lb 8 oz), last menstrual period 07/17/2015, SpO2 100 %.  Physical Exam  Nursing note and vitals reviewed. Constitutional: She is oriented to person, place, and time. She appears well-developed and well-nourished. No distress.  Cardiovascular: Normal rate.   Respiratory: Effort normal.  GI: Soft. There is no tenderness. There is no rebound.  Neurological: She is alert and oriented to person, place, and time.  Skin: Skin is warm and dry.  Psychiatric: She has a normal mood and affect.   US Ob Transvaginal  09/03/2015  CLINICAL DATA:  Confirm viability.  Follow-up from 08/15/2015. EXAM: TRANSVAGINAL OB ULTRASOUND TECHNIQUE: Transvaginal ultrasound was performed for complete evaluation of the gestation as well as the maternal uterus, adnexal regions, and pelvic cul-de-sac. COMPARISON:  08/15/2015 FINDINGS: Intrauterine gestational sac: Single Yolk sac:  Yes Embryo:  Yes Cardiac Activity: Yes Heart Rate: 138 bpm MSD:   mm    w     d CRL:   12.4  mm   7 w 3 d                  [place] EDC: 04/18/2016 Subchorionic hemorrhage:  Small Maternal uterus/adnexae: Normal.  No free pelvic fluid. IMPRESSION: Single living intrauterine gestation measuring 7 weeks 3 days by crown-rump length. Electronically Signed   By: Ellery Plunk M.D.   On: 09/03/2015 20:52   Results for orders placed or performed during the hospital  encounter of 09/03/15 (from the past 24 hour(s))  hCG, quantitative, pregnancy     Status: Abnormal   Collection Time: 09/03/15  8:19 PM  Result Value Ref Range   hCG, Beta Chain, Quant, S [id number] (H) <5 mIU/mL    MAU Course  Procedures  MDM Reviewed US with the patient, and she continues to insist that she is 18 months pregnant. Discussed US results at length with the patient, and the interpreter on the phone. Showed the patient the pictures.   Assessment and Plan   1. Pregnancy related abdominal pain of lower quadrant, antepartum   2. Intrauterine  pregnancy   3. [redacted] weeks gestation of pregnancy    DC home Comfort measures reviewed  1st Trimester precautions  RX: none  Return to MAU as needed FU with OB as planned  Follow-up Information    [facility]   Specialty:  Obstetrics and Gynecology   Contact information:   [address] [place] [id number] [phone]      Tawnya CrookHogan, Heather Donovan 09/03/2015, 8:15 PM

## 2015-09-03 NOTE — MAU Note (Signed)
Pt and her husband report that she was suppose to return 1-2 weeks ago to do more testing about the pregnancy but were unable to make that appointment. Denies pain or bleeding , states they are just here to have the testing done.

## 2015-10-28 ENCOUNTER — Ambulatory Visit (INDEPENDENT_AMBULATORY_CARE_PROVIDER_SITE_OTHER): Payer: Self-pay | Admitting: Family Medicine

## 2015-10-28 ENCOUNTER — Encounter: Payer: Self-pay | Admitting: Family Medicine

## 2015-10-28 VITALS — BP 110/67 | HR 105 | Wt 119.6 lb

## 2015-10-28 DIAGNOSIS — Z8759 Personal history of other complications of pregnancy, childbirth and the puerperium: Secondary | ICD-10-CM

## 2015-10-28 DIAGNOSIS — O09892 Supervision of other high risk pregnancies, second trimester: Secondary | ICD-10-CM

## 2015-10-28 DIAGNOSIS — O09899 Supervision of other high risk pregnancies, unspecified trimester: Secondary | ICD-10-CM | POA: Insufficient documentation

## 2015-10-28 DIAGNOSIS — O0990 Supervision of high risk pregnancy, unspecified, unspecified trimester: Secondary | ICD-10-CM | POA: Insufficient documentation

## 2015-10-28 DIAGNOSIS — O0992 Supervision of high risk pregnancy, unspecified, second trimester: Secondary | ICD-10-CM

## 2015-10-28 DIAGNOSIS — F333 Major depressive disorder, recurrent, severe with psychotic symptoms: Secondary | ICD-10-CM

## 2015-10-28 DIAGNOSIS — Z23 Encounter for immunization: Secondary | ICD-10-CM

## 2015-10-28 DIAGNOSIS — O09219 Supervision of pregnancy with history of pre-term labor, unspecified trimester: Secondary | ICD-10-CM

## 2015-10-28 DIAGNOSIS — O09212 Supervision of pregnancy with history of pre-term labor, second trimester: Secondary | ICD-10-CM

## 2015-10-28 DIAGNOSIS — O99342 Other mental disorders complicating pregnancy, second trimester: Secondary | ICD-10-CM

## 2015-10-28 LAB — POCT URINALYSIS DIP (DEVICE)
Glucose, UA: NEGATIVE mg/dL
HGB URINE DIPSTICK: NEGATIVE
NITRITE: NEGATIVE
PH: 5.5 (ref 5.0–8.0)
Protein, ur: NEGATIVE mg/dL
SPECIFIC GRAVITY, URINE: 1.02 (ref 1.005–1.030)
UROBILINOGEN UA: 1 mg/dL (ref 0.0–1.0)

## 2015-10-28 MED ORDER — PRENATAL VITAMINS 0.8 MG PO TABS: 1.0000 | ORAL_TABLET | Freq: Every day | ORAL | Status: DC

## 2015-10-28 NOTE — Progress Notes (Signed)
Subjective:  Amy Mcclain is a 30 y.o. [id number] at 256w5d being seen today for ongoing prenatal care.  She is currently monitored for the following issues for this high-risk pregnancy and has Schizophrenia (HCC); Severe recurrent major depression with psychotic features (HCC); Involuntary commitment; Supervision of high risk pregnancy, antepartum; History of preterm delivery, currently pregnant; and Prior fetal demise, currently not pregnant on her problem list.  Patient reports no complaints.  Contractions: Not present. Vag. Bleeding: None.  Movement: Present. Denies leaking of fluid.   The following portions of the patient's history were reviewed and updated as appropriate: allergies, current medications, past family history, past medical history, past social history, past surgical history and problem list. Problem list updated.  Objective:   Filed Vitals:   10/28/15 0906  BP: 110/67  Pulse: 105  Weight: 119 lb 9.6 oz (54.25 kg)    Fetal Status: Fetal Heart Rate (bpm): 157   Movement: Present     General:  Alert, oriented and cooperative. Patient is in no acute distress.  Skin: Skin is warm and dry. No rash noted.   Cardiovascular: Normal heart rate noted  Respiratory: Normal respiratory effort, no problems with respiration noted  Abdomen: Soft, gravid, appropriate for gestational age. Pain/Pressure: Present     Pelvic: Vag. Bleeding: None     Cervical exam deferred        Extremities: Normal range of motion.  Edema: None  Mental Status: Normal mood and affect. Normal behavior. Normal judgment and thought content.   Urinalysis: Urine Protein: Negative Urine Glucose: Negative  Assessment and Plan:  Pregnancy: [id number] at 416w5d  1. Supervision of high risk pregnancy, antepartum, second trimester - initial prenatal visit, reviewed history in detail.  - Added CWH box - Cytology - PAP - GC[facility]Chlamydia probe amp ()[facility] - Hemoglobinopathy evaluation - Cystic fibrosis  diagnostic study - Culture, OB Urine - [id number] PDM PROFILE - US MFM OB COMP + 14 WK; Future - Flu Vaccine QUAD 36+ mos IM; Standing - Flu Vaccine QUAD 36+ mos IM  2. Severe recurrent major depression with psychotic features (HCC) Previously on a medication for "depression" for 15 days. It might Prozac and haldol which are on medication list but patient and partner do not remember the medications. She is [facility] for depression with delusions/schizophrenia.  - q trimester EDPS and follow mood sx closely - discussed with patient that if psych MD would like to start medications there are medications that are safe in pregnancy.  - next appt 6/14 with psych MD  3. History of preterm birth, likely Cervical Insufficiency- patient and husband describe assymptomatic dilation of cervix at ~6 months with neonatal demise  - Meets criteria for 5817- P, application started today - US ordered today and will have CL  Preterm labor symptoms and general obstetric precautions including but not limited to vaginal bleeding, contractions, leaking of fluid and fetal movement were reviewed in detail with the patient. Please refer to After Visit Summary for other counseling recommendations.  Return in about 4 weeks (around 11/25/2015) for Routine prenatal care.  Future Appointments Date Time Provider Department Center  11/18/2015 10:45 AM [facility] US 2 [facility]US M[facility]-US  11/27/2015 8:05 AM Tereso NewcomerUgonna A Anyanwu, MD [facility]    Federico FlakeKimberly Niles Naida Escalante, MD

## 2015-10-28 NOTE — Patient Instructions (Addendum)
You will need to start 17-P injections   Safe Medications in Pregnancy   Acne: Benzoyl Peroxide Salicylic Acid  Backache/Headache: Tylenol: 2 regular strength every 4 hours OR              2 Extra strength every 6 hours  Colds/Coughs/Allergies: Benadryl (alcohol free) 25 mg every 6 hours as needed Breath right strips Claritin Cepacol throat lozenges Chloraseptic throat spray Cold-Eeze- up to three times per day Cough drops, alcohol free Flonase (by prescription only) Guaifenesin Mucinex Robitussin DM (plain only, alcohol free) Saline nasal spray/drops Sudafed (pseudoephedrine) & Actifed ** use only after [redacted] weeks gestation and if you do not have high blood pressure Tylenol Vicks Vaporub Zinc lozenges Zyrtec   Constipation: Colace Ducolax suppositories Fleet enema Glycerin suppositories Metamucil Milk of magnesia Miralax Senokot Smooth move tea  Diarrhea: Kaopectate Imodium A-D  *NO pepto Bismol  Hemorrhoids: Anusol Anusol HC Preparation H Tucks  Indigestion: Tums Maalox Mylanta Zantac  Pepcid  Insomnia: Benadryl (alcohol free)  every 6 hours as needed Tylenol PM Unisom, no Gelcaps  Leg Cramps: Tums MagGel  Nausea/Vomiting:  Bonine Dramamine Emetrol Ginger extract Sea bands Meclizine   Nausea medication to take during pregnancy:  Unisom (doxylamine succinate 25 mg tablets) Take one tablet daily at bedtime. If symptoms are not adequately controlled, the dose can be increased to a maximum recommended dose of two tablets daily (1/2 tablet in the morning, 1/2 tablet mid-afternoon and one at bedtime). Vitamin B6  tablets. Take one tablet twice a day (up to 200 mg per day).  Skin Rashes: Aveeno products Benadryl cream or  every 6 hours as needed Calamine Lotion 1% cortisone cream  Yeast infection: Gyne-lotrimin 7 Monistat 7   **If taking multiple medications, please check labels to avoid duplicating the same active  ingredients **take medication as directed on the label ** Do not exceed 4000 mg of tylenol in 24 hours **Do not take medications that contain aspirin or ibuprofen     First Trimester of Pregnancy The first trimester of pregnancy is from week 1 until the end of week 12 (months 1 through 3). A week after a sperm fertilizes an egg, the egg will implant on the wall of the uterus. This embryo will begin to develop into a baby. Genes from you and your partner are forming the baby. The female genes determine whether the baby is a boy or a girl. At 6-8 weeks, the eyes and face are formed, and the heartbeat can be seen on ultrasound. At the end of 12 weeks, all the baby's organs are formed.  Now that you are pregnant, you will want to do everything you can to have a healthy baby. Two of the most important things are to get good prenatal care and to follow your health care provider's instructions. Prenatal care is all the medical care you receive before the baby's birth. This care will help prevent, find, and treat any problems during the pregnancy and childbirth. BODY CHANGES Your body goes through many changes during pregnancy. The changes vary from woman to woman.   You may gain or lose a couple of pounds at first.  You may feel sick to your stomach (nauseous) and throw up (vomit). If the vomiting is uncontrollable, call your health care provider.  You may tire easily.  You may develop headaches that can be relieved by medicines approved by your health care provider.  You may urinate more often. Painful urination may mean you have a bladder  infection.  You may develop heartburn as a result of your pregnancy.  You may develop constipation because certain hormones are causing the muscles that push waste through your intestines to slow down.  You may develop hemorrhoids or swollen, bulging veins (varicose veins).  Your breasts may begin to grow larger and become tender. Your nipples may stick out  more, and the tissue that surrounds them (areola) may become darker.  Your gums may bleed and may be sensitive to brushing and flossing.  Dark spots or blotches (chloasma, mask of pregnancy) may develop on your face. This will likely fade after the baby is born.  Your menstrual periods will stop.  You may have a loss of appetite.  You may develop cravings for certain kinds of food.  You may have changes in your emotions from day to day, such as being excited to be pregnant or being concerned that something may go wrong with the pregnancy and baby.  You may have more vivid and strange dreams.  You may have changes in your hair. These can include thickening of your hair, rapid growth, and changes in texture. Some women also have hair loss during or after pregnancy, or hair that feels dry or thin. Your hair will most likely return to normal after your baby is born. WHAT TO EXPECT AT YOUR PRENATAL VISITS During a routine prenatal visit:  You will be weighed to make sure you and the baby are growing normally.  Your blood pressure will be taken.  Your abdomen will be measured to track your baby's growth.  The fetal heartbeat will be listened to starting around week 10 or 12 of your pregnancy.  Test results from any previous visits will be discussed. Your health care provider may ask you:  How you are feeling.  If you are feeling the baby move.  If you have had any abnormal symptoms, such as leaking fluid, bleeding, severe headaches, or abdominal cramping.  If you are using any tobacco products, including cigarettes, chewing tobacco, and electronic cigarettes.  If you have any questions. Other tests that may be performed during your first trimester include:  Blood tests to find your blood type and to check for the presence of any previous infections. They will also be used to check for low iron levels (anemia) and Rh antibodies. Later in the pregnancy, blood tests for diabetes will  be done along with other tests if problems develop.  Urine tests to check for infections, diabetes, or protein in the urine.  An ultrasound to confirm the proper growth and development of the baby.  An amniocentesis to check for possible genetic problems.  Fetal screens for spina bifida and Down syndrome.  You may need other tests to make sure you and the baby are doing well.  HIV (human immunodeficiency virus) testing. Routine prenatal testing includes screening for HIV, unless you choose not to have this test. HOME CARE INSTRUCTIONS  Medicines  Follow your health care provider's instructions regarding medicine use. Specific medicines may be either safe or unsafe to take during pregnancy.  Take your prenatal vitamins as directed.  If you develop constipation, try taking a stool softener if your health care provider approves. Diet  Eat regular, well-balanced meals. Choose a variety of foods, such as meat or vegetable-based protein, fish, milk and low-fat dairy products, vegetables, fruits, and whole grain breads and cereals. Your health care provider will help you determine the amount of weight gain that is right for you.  Avoid  raw meat and uncooked cheese. These carry germs that can cause birth defects in the baby.  Eating four or five small meals rather than three large meals a day may help relieve nausea and vomiting. If you start to feel nauseous, eating a few soda crackers can be helpful. Drinking liquids between meals instead of during meals also seems to help nausea and vomiting.  If you develop constipation, eat more high-fiber foods, such as fresh vegetables or fruit and whole grains. Drink enough fluids to keep your urine clear or pale yellow. Activity and Exercise  Exercise only as directed by your health care provider. Exercising will help you:  Control your weight.  Stay in shape.  Be prepared for labor and delivery.  Experiencing pain or cramping in the lower  abdomen or low back is a good sign that you should stop exercising. Check with your health care provider before continuing normal exercises.  Try to avoid standing for long periods of time. Move your legs often if you must stand in one place for a long time.  Avoid heavy lifting.  Wear low-heeled shoes, and practice good posture.  You may continue to have sex unless your health care provider directs you otherwise. Relief of Pain or Discomfort  Wear a good support bra for breast tenderness.   Take warm sitz baths to soothe any pain or discomfort caused by hemorrhoids. Use hemorrhoid cream if your health care provider approves.   Rest with your legs elevated if you have leg cramps or low back pain.  If you develop varicose veins in your legs, wear support hose. Elevate your feet for 15 minutes, 3-4 times a day. Limit salt in your diet. Prenatal Care  Schedule your prenatal visits by the twelfth week of pregnancy. They are usually scheduled monthly at first, then more often in the last 2 months before delivery.  Write down your questions. Take them to your prenatal visits.  Keep all your prenatal visits as directed by your health care provider. Safety  Wear your seat belt at all times when driving.  Make a list of emergency phone numbers, including numbers for family, friends, the hospital, and police and fire departments. General Tips  Ask your health care provider for a referral to a local prenatal education class. Begin classes no later than at the beginning of month 6 of your pregnancy.  Ask for help if you have counseling or nutritional needs during pregnancy. Your health care provider can offer advice or refer you to specialists for help with various needs.  Do not use hot tubs, steam rooms, or saunas.  Do not douche or use tampons or scented sanitary pads.  Do not cross your legs for long periods of time.  Avoid cat litter boxes and soil used by cats. These carry germs  that can cause birth defects in the baby and possibly loss of the fetus by miscarriage or stillbirth.  Avoid all smoking, herbs, alcohol, and medicines not prescribed by your health care provider. Chemicals in these affect the formation and growth of the baby.  Do not use any tobacco products, including cigarettes, chewing tobacco, and electronic cigarettes. If you need help quitting, ask your health care provider. You may receive counseling support and other resources to help you quit.  Schedule a dentist appointment. At home, brush your teeth with a soft toothbrush and be gentle when you floss. SEEK MEDICAL CARE IF:   You have dizziness.  You have mild pelvic cramps, pelvic pressure,  or nagging pain in the abdominal area.  You have persistent nausea, vomiting, or diarrhea.  You have a bad smelling vaginal discharge.  You have pain with urination.  You notice increased swelling in your face, hands, legs, or ankles. SEEK IMMEDIATE MEDICAL CARE IF:   You have a fever.  You are leaking fluid from your vagina.  You have spotting or bleeding from your vagina.  You have severe abdominal cramping or pain.  You have rapid weight gain or loss.  You vomit blood or material that looks like coffee grounds.  You are exposed to [place] measles and have never had them.  You are exposed to fifth disease or chickenpox.  You develop a severe headache.  You have shortness of breath.  You have any kind of trauma, such as from a fall or a car accident.   This information is not intended to replace advice given to you by your health care provider. Make sure you discuss any questions you have with your health care provider.   Document Released: 05/11/2001 Document Revised: 06/07/2014 Document Reviewed: 03/27/2013 Elsevier Interactive Patient Education [employer]

## 2015-10-28 NOTE — Progress Notes (Signed)
Pt has had diarrhea/ nausea and vomiting 4 days ago Initial prenatal labs  Needs Pap Flu vaccine today "Saif" [id number] video interpreter used

## 2015-10-29 LAB — PRENATAL PROFILE (SOLSTAS)
Antibody Screen: NEGATIVE
BASOS ABS: 0 {cells}/uL (ref 0–200)
Basophils Relative: 0 %
EOS ABS: 637 {cells}/uL — AB (ref 15–500)
Eosinophils Relative: 7 %
HCT: 35.3 % (ref 35.0–45.0)
HEP B S AG: NEGATIVE
HIV 1&2 Ab, 4th Generation: NONREACTIVE
Hemoglobin: 11.5 g/dL — ABNORMAL LOW (ref 11.7–15.5)
LYMPHS ABS: 2275 {cells}/uL (ref 850–3900)
Lymphocytes Relative: 25 %
MCH: 30.4 pg (ref 27.0–33.0)
MCHC: 32.6 g/dL (ref 32.0–36.0)
MCV: 93.4 fL (ref 80.0–100.0)
MPV: 10.7 fL (ref 7.5–12.5)
Monocytes Absolute: 637 cells/uL (ref 200–950)
Monocytes Relative: 7 %
NEUTROS ABS: 5551 {cells}/uL (ref 1500–7800)
NEUTROS PCT: 61 %
PLATELETS: 257 10*3/uL (ref 140–400)
RBC: 3.78 MIL/uL — ABNORMAL LOW (ref 3.80–5.10)
RDW: 12.6 % (ref 11.0–15.0)
RPR: REACTIVE — AB
RUBELLA: 9.51 {index} — AB (ref <0.90)
Rh Type: POSITIVE
WBC: 9.1 10*3/uL (ref 3.8–10.8)

## 2015-10-29 LAB — CULTURE, OB URINE

## 2015-10-29 LAB — RPR TITER: RPR Titer: 1:2 {titer}

## 2015-10-29 LAB — GC/CHLAMYDIA PROBE AMP (~~LOC~~) NOT AT ARMC
CHLAMYDIA, DNA PROBE: NEGATIVE
NEISSERIA GONORRHEA: NEGATIVE

## 2015-10-30 LAB — HEMOGLOBINOPATHY EVALUATION
HEMOGLOBIN OTHER: 0 %
HGB A2 QUANT: 2.9 % (ref 1.8–3.5)
HGB A: 96.4 % (ref >96.0)
HGB F QUANT: 0.7 % (ref <2.0)
Hgb S Quant: 0 %

## 2015-10-30 LAB — CYTOLOGY - PAP

## 2015-10-30 LAB — FLUORESCENT TREPONEMAL AB(FTA)-IGG-BLD: Fluorescent Treponemal ABS: NONREACTIVE

## 2015-11-02 LAB — CP5000051 PDM PROFILE
Amphetamines: NEGATIVE ng/mL (ref <500)
BENZODIAZEPINES: NEGATIVE ng/mL (ref <100)
BUPRENORPHINE: NEGATIVE ng/mL (ref <5)
Barbiturates: NEGATIVE ng/mL (ref <300)
Cocaine Metabolite: NEGATIVE ng/mL (ref <150)
DESMETHYLTRAMADOL: NEGATIVE ng/mL (ref <100)
Fentanyl: NEGATIVE ng/mL (ref <0.5)
MARIJUANA METABOLITE: NEGATIVE ng/mL (ref <20)
MDMA: NEGATIVE ng/mL (ref <500)
MEPERIDINE: NEGATIVE ng/mL (ref <100)
MEPROBAMATE: NEGATIVE ng/mL (ref <1000)
Methadone Metabolite: NEGATIVE ng/mL (ref <100)
NORMEPERIDINE: NEGATIVE ng/mL (ref <100)
Norfentanyl: NEGATIVE ng/mL (ref <0.5)
Nortapentadol: NEGATIVE ng/mL (ref <50)
OPIATES: NEGATIVE ng/mL (ref <100)
OXYCODONE: NEGATIVE ng/mL (ref <100)
PHENCYCLIDINE: NEGATIVE ng/mL (ref <25)
Propoxyphene: NEGATIVE ng/mL (ref <300)
TAPENTADOL: NEGATIVE ng/mL (ref <50)
TRAMADOL: NEGATIVE ng/mL (ref <100)
ZOLPIDEM METABOLITE: NEGATIVE ng/mL (ref <5)
ZOLPIDEM: NEGATIVE ng/mL (ref <5)

## 2015-11-04 LAB — CYSTIC FIBROSIS DIAGNOSTIC STUDY

## 2015-11-07 ENCOUNTER — Encounter (HOSPITAL_COMMUNITY): Payer: Self-pay | Admitting: Family Medicine

## 2015-11-10 ENCOUNTER — Encounter (HOSPITAL_COMMUNITY): Payer: Self-pay | Admitting: Family Medicine

## 2015-11-18 ENCOUNTER — Other Ambulatory Visit: Payer: Self-pay | Admitting: Family Medicine

## 2015-11-18 ENCOUNTER — Ambulatory Visit (HOSPITAL_COMMUNITY)
Admission: RE | Admit: 2015-11-18 | Discharge: 2015-11-18 | Disposition: A | Discharge status: Home / Self Care | Payer: Medicaid Other | Source: Ambulatory Visit | Attending: Family Medicine | Admitting: Family Medicine

## 2015-11-18 DIAGNOSIS — O99342 Other mental disorders complicating pregnancy, second trimester: Secondary | ICD-10-CM

## 2015-11-18 DIAGNOSIS — Z36 Encounter for antenatal screening of mother: Secondary | ICD-10-CM | POA: Insufficient documentation

## 2015-11-18 DIAGNOSIS — Z3689 Encounter for other specified antenatal screening: Secondary | ICD-10-CM

## 2015-11-18 DIAGNOSIS — O09892 Supervision of other high risk pregnancies, second trimester: Secondary | ICD-10-CM

## 2015-11-18 DIAGNOSIS — Z3A17 17 weeks gestation of pregnancy: Secondary | ICD-10-CM | POA: Diagnosis not present

## 2015-11-18 DIAGNOSIS — O09212 Supervision of pregnancy with history of pre-term labor, second trimester: Secondary | ICD-10-CM | POA: Insufficient documentation

## 2015-11-18 DIAGNOSIS — O09219 Supervision of pregnancy with history of pre-term labor, unspecified trimester: Secondary | ICD-10-CM

## 2015-11-18 DIAGNOSIS — O09899 Supervision of other high risk pregnancies, unspecified trimester: Secondary | ICD-10-CM

## 2015-11-18 DIAGNOSIS — O0992 Supervision of high risk pregnancy, unspecified, second trimester: Secondary | ICD-10-CM

## 2015-11-26 ENCOUNTER — Encounter: Payer: Self-pay | Admitting: Student

## 2015-11-27 ENCOUNTER — Ambulatory Visit (HOSPITAL_COMMUNITY): Admission: RE | Admit: 2015-11-27 | Payer: Medicaid Other | Source: Ambulatory Visit

## 2015-11-27 ENCOUNTER — Ambulatory Visit (INDEPENDENT_AMBULATORY_CARE_PROVIDER_SITE_OTHER): Payer: Medicaid Other | Admitting: Obstetrics & Gynecology

## 2015-11-27 VITALS — BP 118/64 | HR 106 | Wt 121.2 lb

## 2015-11-27 DIAGNOSIS — O09892 Supervision of other high risk pregnancies, second trimester: Secondary | ICD-10-CM

## 2015-11-27 DIAGNOSIS — F209 Schizophrenia, unspecified: Secondary | ICD-10-CM

## 2015-11-27 DIAGNOSIS — O99340 Other mental disorders complicating pregnancy, unspecified trimester: Secondary | ICD-10-CM | POA: Insufficient documentation

## 2015-11-27 DIAGNOSIS — F333 Major depressive disorder, recurrent, severe with psychotic symptoms: Secondary | ICD-10-CM

## 2015-11-27 DIAGNOSIS — O0992 Supervision of high risk pregnancy, unspecified, second trimester: Secondary | ICD-10-CM

## 2015-11-27 DIAGNOSIS — O99342 Other mental disorders complicating pregnancy, second trimester: Secondary | ICD-10-CM

## 2015-11-27 DIAGNOSIS — O09212 Supervision of pregnancy with history of pre-term labor, second trimester: Secondary | ICD-10-CM

## 2015-11-27 LAB — POCT URINALYSIS DIP (DEVICE)
GLUCOSE, UA: NEGATIVE mg/dL
Ketones, ur: NEGATIVE mg/dL
Nitrite: NEGATIVE
PROTEIN: NEGATIVE mg/dL
Specific Gravity, Urine: 1.025 (ref 1.005–1.030)
Urobilinogen, UA: 2 mg/dL — ABNORMAL HIGH (ref 0.0–1.0)
pH: 5.5 (ref 5.0–8.0)

## 2015-11-27 NOTE — Patient Instructions (Signed)
Return to clinic for any scheduled appointments or for any gynecologic concerns as needed.   

## 2015-11-27 NOTE — Progress Notes (Signed)
Subjective:  Amy Mcclain is a 30 y.o. [id number] at 5969w0d being seen today for ongoing prenatal care.  She is currently monitored for the following issues for this high-risk pregnancy and has Schizophrenia (HCC); Severe recurrent major depression with psychotic features (HCC); Involuntary commitment; Supervision of high risk pregnancy, antepartum; History of preterm delivery, currently pregnant; Prior fetal demise, currently not pregnant; and Mental disorder affecting pregnancy, antepartum on her problem list.  Patient reports no complaints.  Contractions: Not present. Vag. Bleeding: None.  Movement: Present. Denies leaking of fluid.  Accompanied by husband and children. Used video Clinical research associateArabic interpreter.  The following portions of the patient's history were reviewed and updated as appropriate: allergies, current medications, past family history, past medical history, past social history, past surgical history and problem list. Problem list updated.  Objective:   Filed Vitals:   11/27/15 0919  BP: 118/64  Pulse: 106  Weight: 121 lb 3.2 oz (54.976 kg)    Fetal Status: Fetal Heart Rate (bpm): 151 Fundal Height: 19 cm Movement: Present     General:  Alert, oriented and cooperative. Patient is in no acute distress.  Skin: Skin is warm and dry. No rash noted.   Cardiovascular: Normal heart rate noted  Respiratory: Normal respiratory effort, no problems with respiration noted  Abdomen: Soft, gravid, appropriate for gestational age. Pain/Pressure: Absent     Pelvic: Cervical exam deferred        Extremities: Normal range of motion.  Edema: None  Mental Status: Normal mood and affect. Normal behavior. Normal judgment and thought content.   Urinalysis: Urine Protein: Negative Urine Glucose: Negative  Assessment and Plan:  Pregnancy: [id number] at 7869w0d  1. Mental disorder affecting pregnancy in second trimester 2. Schizophrenia, unspecified type (HCC) 3. Severe recurrent major depression with psychotic  features [facility](HCC) Had inpatient admission at 6 weeks, was followed by Psych. Today, she reports she longer has any appointments with them as she does not "like talking to them". Will continue to observe.  4. History of preterm delivery, currently pregnant, second trimester Awaiting 17P for now  5. Supervision of high risk pregnancy, antepartum, second trimester - AFP, Quad Screen Routine obstetric precautions reviewed. Please refer to After Visit Summary for other counseling recommendations.  Return in about 4 weeks (around 12/25/2015) for OB Visit. Will need to come in earlier as soon as 17P is obtained for her.   Tereso NewcomerUgonna A Yeudiel Mateo, MD

## 2015-11-27 NOTE — Progress Notes (Addendum)
Contacted Makena representative in regards to pt's medication.  Was informed by Marylene LandAngela, pharmacy, that pt's medication will be sent out today for tomorrow delivery. Notified pt that we will call when we receive medication to scheduled first injection of 17p. Pt agreed.

## 2015-11-27 NOTE — Progress Notes (Signed)
Video Interpreter [phone]  Quad Screen today

## 2015-11-28 LAB — AFP, QUAD SCREEN
AFP: 57.2 ng/mL
CURR GEST AGE: 19 wk
Down Syndrome Scr Risk Est: 1:10300 {titer}
HCG, Total: 21.56 IU/mL
INH: 199.4 pg/mL
Interpretation-AFP: NEGATIVE
MOM FOR AFP: 1.04
MOM FOR INH: 1.03
MoM for hCG: 0.8
OPEN SPINA BIFIDA: NEGATIVE
Osb Risk: 1:11800 {titer}
Tri 18 Scr Risk Est: NEGATIVE
Trisomy 18 (Edward) Syndrome Interp.: 1:33000 {titer}
UE3 MOM: 0.99
uE3 Value: 1.6 ng/mL

## 2015-12-03 ENCOUNTER — Encounter (HOSPITAL_COMMUNITY): Payer: Self-pay

## 2015-12-04 ENCOUNTER — Ambulatory Visit (HOSPITAL_COMMUNITY)
Admission: RE | Admit: 2015-12-04 | Discharge: 2015-12-04 | Disposition: A | Discharge status: Home / Self Care | Payer: Medicaid Other | Source: Ambulatory Visit | Attending: Family Medicine | Admitting: Family Medicine

## 2015-12-04 ENCOUNTER — Ambulatory Visit: Payer: Self-pay

## 2015-12-04 ENCOUNTER — Encounter (HOSPITAL_COMMUNITY): Payer: Self-pay

## 2015-12-04 DIAGNOSIS — O99342 Other mental disorders complicating pregnancy, second trimester: Secondary | ICD-10-CM | POA: Insufficient documentation

## 2015-12-04 DIAGNOSIS — O09212 Supervision of pregnancy with history of pre-term labor, second trimester: Secondary | ICD-10-CM | POA: Insufficient documentation

## 2015-12-04 DIAGNOSIS — Z3A20 20 weeks gestation of pregnancy: Secondary | ICD-10-CM | POA: Diagnosis not present

## 2015-12-04 DIAGNOSIS — O09899 Supervision of other high risk pregnancies, unspecified trimester: Secondary | ICD-10-CM

## 2015-12-04 DIAGNOSIS — O09219 Supervision of pregnancy with history of pre-term labor, unspecified trimester: Secondary | ICD-10-CM

## 2015-12-04 NOTE — ED Notes (Signed)
Pt reports a brownish discharge since March, denies itching or odor.

## 2015-12-18 ENCOUNTER — Encounter (HOSPITAL_COMMUNITY): Payer: Self-pay

## 2015-12-18 ENCOUNTER — Ambulatory Visit (HOSPITAL_COMMUNITY)
Admission: RE | Admit: 2015-12-18 | Discharge: 2015-12-18 | Disposition: A | Discharge status: Home / Self Care | Payer: Medicaid Other | Source: Ambulatory Visit | Attending: Family Medicine | Admitting: Family Medicine

## 2015-12-18 ENCOUNTER — Ambulatory Visit (INDEPENDENT_AMBULATORY_CARE_PROVIDER_SITE_OTHER): Payer: Medicaid Other | Admitting: *Deleted

## 2015-12-18 VITALS — BP 113/65 | HR 80 | Wt 125.0 lb

## 2015-12-18 DIAGNOSIS — O09212 Supervision of pregnancy with history of pre-term labor, second trimester: Secondary | ICD-10-CM

## 2015-12-18 DIAGNOSIS — O99342 Other mental disorders complicating pregnancy, second trimester: Secondary | ICD-10-CM | POA: Insufficient documentation

## 2015-12-18 DIAGNOSIS — Z3A22 22 weeks gestation of pregnancy: Secondary | ICD-10-CM | POA: Insufficient documentation

## 2015-12-18 DIAGNOSIS — O09219 Supervision of pregnancy with history of pre-term labor, unspecified trimester: Secondary | ICD-10-CM

## 2015-12-18 DIAGNOSIS — O09892 Supervision of other high risk pregnancies, second trimester: Secondary | ICD-10-CM

## 2015-12-18 DIAGNOSIS — O09899 Supervision of other high risk pregnancies, unspecified trimester: Secondary | ICD-10-CM

## 2015-12-18 MED ORDER — HYDROXYPROGESTERONE CAPROATE 250 MG/ML IM OIL
250.0000 mg | TOPICAL_OIL | Freq: Once | INTRAMUSCULAR | Status: AC
  Administered 2015-12-18: 250 mg via INTRAMUSCULAR

## 2015-12-18 NOTE — ED Notes (Signed)
Pt questioning when she will be getting her "injection to help her baby stay in."  [facility] Makena injection availability.  Per RN, injection has arrived.  Pt to go to clinic after this appointment to receive injection.  Pt notified with interpreter.

## 2015-12-25 ENCOUNTER — Ambulatory Visit: Payer: Self-pay

## 2015-12-30 ENCOUNTER — Ambulatory Visit (INDEPENDENT_AMBULATORY_CARE_PROVIDER_SITE_OTHER): Payer: Medicaid Other | Admitting: Family

## 2015-12-30 DIAGNOSIS — O0992 Supervision of high risk pregnancy, unspecified, second trimester: Secondary | ICD-10-CM | POA: Diagnosis present

## 2015-12-30 DIAGNOSIS — O09892 Supervision of other high risk pregnancies, second trimester: Secondary | ICD-10-CM

## 2015-12-30 DIAGNOSIS — O09212 Supervision of pregnancy with history of pre-term labor, second trimester: Secondary | ICD-10-CM

## 2015-12-30 LAB — POCT URINALYSIS DIP (DEVICE)
BILIRUBIN URINE: NEGATIVE
GLUCOSE, UA: NEGATIVE mg/dL
HGB URINE DIPSTICK: NEGATIVE
Ketones, ur: NEGATIVE mg/dL
LEUKOCYTES UA: NEGATIVE
NITRITE: NEGATIVE
Protein, ur: NEGATIVE mg/dL
SPECIFIC GRAVITY, URINE: 1.015 (ref 1.005–1.030)
UROBILINOGEN UA: 0.2 mg/dL (ref 0.0–1.0)
pH: 7 (ref 5.0–8.0)

## 2015-12-30 MED ORDER — HYDROXYPROGESTERONE CAPROATE 250 MG/ML IM OIL
250.0000 mg | TOPICAL_OIL | Freq: Once | INTRAMUSCULAR | Status: AC
  Administered 2015-12-30: 250 mg via INTRAMUSCULAR

## 2015-12-30 NOTE — Progress Notes (Signed)
Patient seen by Rochele Pages

## 2015-12-30 NOTE — Progress Notes (Signed)
Subjective:  Amy Mcclain is a 30 y.o. V3K1224 at [redacted]w[redacted]d being seen today for ongoing prenatal care.  She is currently monitored for the following issues for this high-risk pregnancy and has Schizophrenia (HCC); Severe recurrent major depression with psychotic features (HCC); Involuntary commitment; Supervision of high risk pregnancy, antepartum; History of preterm delivery, currently pregnant; Prior fetal demise, currently not pregnant; and Mental disorder affecting pregnancy, antepartum on her problem list.  Patient reports no complaints.  Contractions: Not present.  .  Movement: Present. Denies leaking of fluid.   The following portions of the patient's history were reviewed and updated as appropriate: allergies, current medications, past family history, past medical history, past social history, past surgical history and problem list. Problem list updated.  Objective:   Vitals:   12/30/15 0854  BP: 94/65  Pulse: 72  Weight: 126 lb (57.2 kg)    Fetal Status: Fetal Heart Rate (bpm): 142 Fundal Height: 24 cm Movement: Present     General:  Alert, oriented and cooperative. Patient is in no acute distress.  Skin: Skin is warm and dry. No rash noted.   Cardiovascular: Normal heart rate noted  Respiratory: Normal respiratory effort, no problems with respiration noted  Abdomen: Soft, gravid, appropriate for gestational age. Pain/Pressure: Absent     Pelvic:  Cervical exam deferred        Extremities: Normal range of motion.  Edema: None  Mental Status: Normal mood and affect. Normal behavior. Normal judgment and thought content.   Urinalysis: Urine Protein: Negative Urine Glucose: Negative  Assessment and Plan:  Pregnancy: S9P5300 at [redacted]w[redacted]d  1. Supervision of high risk pregnancy, antepartum, second trimester - Reviewed labs  2. History of preterm delivery, currently pregnant, second trimester - 17p today  Preterm labor symptoms and general obstetric precautions including but not limited  to vaginal bleeding, contractions, leaking of fluid and fetal movement were reviewed in detail with the patient. Please refer to After Visit Summary for other counseling recommendations.  Return in about 3 weeks (around 01/20/2016) for appt with provider and 17p weekly.   Eino Farber Kennith Gain, CNM

## 2016-01-01 ENCOUNTER — Ambulatory Visit (HOSPITAL_COMMUNITY)
Admission: RE | Admit: 2016-01-01 | Discharge: 2016-01-01 | Disposition: A | Discharge status: Home / Self Care | Payer: Medicaid Other | Source: Ambulatory Visit | Attending: Family Medicine | Admitting: Family Medicine

## 2016-01-01 ENCOUNTER — Encounter (HOSPITAL_COMMUNITY): Payer: Self-pay

## 2016-01-01 DIAGNOSIS — Z3A34 34 weeks gestation of pregnancy: Secondary | ICD-10-CM | POA: Insufficient documentation

## 2016-01-01 DIAGNOSIS — O09219 Supervision of pregnancy with history of pre-term labor, unspecified trimester: Secondary | ICD-10-CM

## 2016-01-01 DIAGNOSIS — O99342 Other mental disorders complicating pregnancy, second trimester: Secondary | ICD-10-CM | POA: Insufficient documentation

## 2016-01-01 DIAGNOSIS — O09899 Supervision of other high risk pregnancies, unspecified trimester: Secondary | ICD-10-CM

## 2016-01-01 DIAGNOSIS — O09212 Supervision of pregnancy with history of pre-term labor, second trimester: Secondary | ICD-10-CM | POA: Diagnosis present

## 2016-01-06 ENCOUNTER — Ambulatory Visit (INDEPENDENT_AMBULATORY_CARE_PROVIDER_SITE_OTHER): Payer: Medicaid Other

## 2016-01-06 DIAGNOSIS — O09212 Supervision of pregnancy with history of pre-term labor, second trimester: Secondary | ICD-10-CM

## 2016-01-06 DIAGNOSIS — O09892 Supervision of other high risk pregnancies, second trimester: Secondary | ICD-10-CM

## 2016-01-06 MED ORDER — HYDROXYPROGESTERONE CAPROATE 250 MG/ML IM OIL
250.0000 mg | TOPICAL_OIL | Freq: Once | INTRAMUSCULAR | Status: AC
  Administered 2016-01-06: 250 mg via INTRAMUSCULAR

## 2016-01-13 ENCOUNTER — Ambulatory Visit (INDEPENDENT_AMBULATORY_CARE_PROVIDER_SITE_OTHER): Payer: Medicaid Other

## 2016-01-13 VITALS — BP 117/67 | HR 101

## 2016-01-13 DIAGNOSIS — O09212 Supervision of pregnancy with history of pre-term labor, second trimester: Secondary | ICD-10-CM | POA: Diagnosis not present

## 2016-01-13 DIAGNOSIS — O09892 Supervision of other high risk pregnancies, second trimester: Secondary | ICD-10-CM

## 2016-01-13 MED ORDER — HYDROXYPROGESTERONE CAPROATE 250 MG/ML IM OIL
250.0000 mg | TOPICAL_OIL | Freq: Once | INTRAMUSCULAR | Status: AC
  Administered 2016-01-13: 250 mg via INTRAMUSCULAR

## 2016-01-13 NOTE — Progress Notes (Signed)
Patient presented to office for her 17-p injection. Given in right ventrogluteal IM.  Patient tolerated well and will follow up next week for her next injection.

## 2016-01-14 ENCOUNTER — Encounter (HOSPITAL_COMMUNITY): Payer: Self-pay

## 2016-01-15 ENCOUNTER — Other Ambulatory Visit (HOSPITAL_COMMUNITY): Payer: Self-pay | Admitting: Maternal and Fetal Medicine

## 2016-01-15 ENCOUNTER — Ambulatory Visit (HOSPITAL_COMMUNITY)
Admission: RE | Admit: 2016-01-15 | Discharge: 2016-01-15 | Disposition: A | Discharge status: Home / Self Care | Payer: Medicaid Other | Source: Ambulatory Visit | Attending: Family Medicine | Admitting: Family Medicine

## 2016-01-15 ENCOUNTER — Encounter (HOSPITAL_COMMUNITY): Payer: Self-pay

## 2016-01-15 VITALS — BP 119/72 | HR 102 | Wt 127.0 lb

## 2016-01-15 DIAGNOSIS — O09219 Supervision of pregnancy with history of pre-term labor, unspecified trimester: Secondary | ICD-10-CM | POA: Insufficient documentation

## 2016-01-15 DIAGNOSIS — F209 Schizophrenia, unspecified: Secondary | ICD-10-CM

## 2016-01-15 DIAGNOSIS — O09899 Supervision of other high risk pregnancies, unspecified trimester: Secondary | ICD-10-CM

## 2016-01-15 DIAGNOSIS — Z3A26 26 weeks gestation of pregnancy: Secondary | ICD-10-CM

## 2016-01-15 NOTE — ED Notes (Signed)
Pt reports pelvic pressure, cramping and heaviness over the last couple of days.  Pt having a brownish discharge with small amt of blood.

## 2016-01-20 ENCOUNTER — Ambulatory Visit (INDEPENDENT_AMBULATORY_CARE_PROVIDER_SITE_OTHER): Payer: Medicaid Other | Admitting: Medical

## 2016-01-20 VITALS — BP 122/74 | HR 108 | Wt 128.8 lb

## 2016-01-20 DIAGNOSIS — Z3492 Encounter for supervision of normal pregnancy, unspecified, second trimester: Secondary | ICD-10-CM

## 2016-01-20 DIAGNOSIS — Z23 Encounter for immunization: Secondary | ICD-10-CM | POA: Diagnosis not present

## 2016-01-20 DIAGNOSIS — Z3482 Encounter for supervision of other normal pregnancy, second trimester: Secondary | ICD-10-CM

## 2016-01-20 LAB — CBC
HCT: 32.9 % — ABNORMAL LOW (ref 35.0–45.0)
Hemoglobin: 11 g/dL — ABNORMAL LOW (ref 11.7–15.5)
MCH: 31.5 pg (ref 27.0–33.0)
MCHC: 33.4 g/dL (ref 32.0–36.0)
MCV: 94.3 fL (ref 80.0–100.0)
MPV: 11.4 fL (ref 7.5–12.5)
PLATELETS: 201 10*3/uL (ref 140–400)
RBC: 3.49 MIL/uL — ABNORMAL LOW (ref 3.80–5.10)
RDW: 12.9 % (ref 11.0–15.0)
WBC: 10.7 10*3/uL (ref 3.8–10.8)

## 2016-01-20 LAB — POCT URINALYSIS DIP (DEVICE)
Glucose, UA: NEGATIVE mg/dL
HGB URINE DIPSTICK: NEGATIVE
Ketones, ur: NEGATIVE mg/dL
Leukocytes, UA: NEGATIVE
NITRITE: NEGATIVE
PH: 6 (ref 5.0–8.0)
PROTEIN: 30 mg/dL — AB
Specific Gravity, Urine: 1.025 (ref 1.005–1.030)
Urobilinogen, UA: 1 mg/dL (ref 0.0–1.0)

## 2016-01-20 LAB — HIV ANTIBODY (ROUTINE TESTING W REFLEX): HIV 1&2 Ab, 4th Generation: NONREACTIVE

## 2016-01-20 LAB — GLUCOSE TOLERANCE, 1 HOUR (50G) W/O FASTING: Glucose, 1 Hr, gestational: 80 mg/dL (ref <140)

## 2016-01-20 MED ORDER — HYDROXYPROGESTERONE CAPROATE 250 MG/ML IM OIL
250.0000 mg | TOPICAL_OIL | INTRAMUSCULAR | Status: AC
  Administered 2016-01-20 – 2016-03-10 (×7): 250 mg via INTRAMUSCULAR

## 2016-01-20 NOTE — Progress Notes (Signed)
1hr gtt today.  Pt reports cramping and some light spotting yesterday. No blood today. Last intercourse 2 days ago.

## 2016-01-20 NOTE — Patient Instructions (Signed)
Braxton Hicks Contractions Contractions of the uterus can occur throughout pregnancy. Contractions are not always a sign that you are in labor.  WHAT ARE BRAXTON HICKS CONTRACTIONS?  Contractions that occur before labor are called Braxton Hicks contractions, or false labor. Toward the end of pregnancy (32-34 weeks), these contractions can develop more often and may become more forceful. This is not true labor because these contractions do not result in opening (dilatation) and thinning of the cervix. They are sometimes difficult to tell apart from true labor because these contractions can be forceful and people have different pain tolerances. You should not feel embarrassed if you go to the hospital with false labor. Sometimes, the only way to tell if you are in true labor is for your health care provider to look for changes in the cervix. If there are no prenatal problems or other health problems associated with the pregnancy, it is completely safe to be sent home with false labor and await the onset of true labor. HOW CAN YOU TELL THE DIFFERENCE BETWEEN TRUE AND FALSE LABOR? False Labor  The contractions of false labor are usually shorter and not as hard as those of true labor.   The contractions are usually irregular.   The contractions are often felt in the front of the lower abdomen and in the groin.   The contractions may go away when you walk around or change positions while lying down.   The contractions get weaker and are shorter lasting as time goes on.   The contractions do not usually become progressively stronger, regular, and closer together as with true labor.  True Labor  Contractions in true labor last 30-70 seconds, become very regular, usually become more intense, and increase in frequency.   The contractions do not go away with walking.   The discomfort is usually felt in the top of the uterus and spreads to the lower abdomen and low back.   True labor can be  determined by your health care provider with an exam. This will show that the cervix is dilating and getting thinner.  WHAT TO REMEMBER  Keep up with your usual exercises and follow other instructions given by your health care provider.   Take medicines as directed by your health care provider.   Keep your regular prenatal appointments.   Eat and drink lightly if you think you are going into labor.   If Braxton Hicks contractions are making you uncomfortable:   Change your position from lying down or resting to walking, or from walking to resting.   Sit and rest in a tub of warm water.   Drink 2-3 glasses of water. Dehydration may cause these contractions.   Do slow and deep breathing several times an hour.  WHEN SHOULD I SEEK IMMEDIATE MEDICAL CARE? Seek immediate medical care if:  Your contractions become stronger, more regular, and closer together.   You have fluid leaking or gushing from your vagina.   You have a fever.   You pass blood-tinged mucus.   You have vaginal bleeding.   You have continuous abdominal pain.   You have low back pain that you never had before.   You feel your baby's head pushing down and causing pelvic pressure.   Your baby is not moving as much as it used to.    This information is not intended to replace advice given to you by your health care provider. Make sure you discuss any questions you have with your health care  provider.   Document Released: 05/17/2005 Document Revised: 05/22/2013 Document Reviewed: 02/26/2013 Elsevier Interactive Patient Education 2016 [employer] Preterm Labor Information Preterm labor is when labor starts before you are [redacted] weeks pregnant. The normal length of pregnancy is 39 to 41 weeks.  CAUSES  The cause of preterm labor is not often known. The most common known cause is infection. RISK FACTORS  Having a history of preterm labor.  Having your water break before it should.  Having a  placenta that covers the opening of the cervix.  Having a placenta that breaks away from the uterus.  Having a cervix that is too weak to hold the baby in the uterus.  Having too much fluid in the amniotic sac.  Taking drugs or smoking while pregnant.  Not gaining enough weight while pregnant.  Being younger than 3118 and older than 31 years old.  Having a low income.  Being African American. SYMPTOMS  Period-like cramps, belly (abdominal) pain, or back pain.  Contractions that are regular, as often as six in an hour. They may be mild or painful.  Contractions that start at the top of the belly. They then move to the lower belly and back.  Lower belly pressure that seems to get stronger.  Bleeding from the vagina.  Fluid leaking from the vagina. TREATMENT  Treatment depends on:  Your condition.  The condition of your baby.  How many weeks pregnant you are. Your doctor may have you:  Take medicine to stop contractions.  Stay in bed except to use the restroom (bed rest).  Stay in the hospital. WHAT SHOULD YOU DO IF YOU THINK YOU ARE IN PRETERM LABOR? Call your doctor right away. You need to go to the hospital right away.  HOW CAN YOU PREVENT PRETERM LABOR IN FUTURE PREGNANCIES?  Stop smoking, if you smoke.  Maintain healthy weight gain.  Do not take drugs or be around chemicals that are not needed.  Tell your doctor if you think you have an infection.  Tell your doctor if you had a preterm labor before.   This information is not intended to replace advice given to you by your health care provider. Make sure you discuss any questions you have with your health care provider.   Document Released: 08/13/2008 Document Revised: 10/01/2014 Document Reviewed: 06/19/2012 Elsevier Interactive Patient Education [employer] Elsevier Inc.

## 2016-01-21 LAB — RPR: RPR Ser Ql: REACTIVE — AB

## 2016-01-21 LAB — RPR TITER

## 2016-01-21 LAB — FLUORESCENT TREPONEMAL AB(FTA)-IGG-BLD: Fluorescent Treponemal ABS: NONREACTIVE

## 2016-01-28 ENCOUNTER — Encounter (HOSPITAL_COMMUNITY): Payer: Self-pay

## 2016-01-29 ENCOUNTER — Ambulatory Visit (HOSPITAL_COMMUNITY)
Admission: RE | Admit: 2016-01-29 | Discharge: 2016-01-29 | Disposition: A | Discharge status: Home / Self Care | Payer: Medicaid Other | Source: Ambulatory Visit | Attending: Family Medicine | Admitting: Family Medicine

## 2016-01-29 ENCOUNTER — Other Ambulatory Visit (HOSPITAL_COMMUNITY): Payer: Self-pay | Admitting: Maternal and Fetal Medicine

## 2016-01-29 ENCOUNTER — Encounter (HOSPITAL_COMMUNITY): Payer: Self-pay

## 2016-01-29 ENCOUNTER — Ambulatory Visit (INDEPENDENT_AMBULATORY_CARE_PROVIDER_SITE_OTHER): Payer: Medicaid Other | Admitting: *Deleted

## 2016-01-29 DIAGNOSIS — O09212 Supervision of pregnancy with history of pre-term labor, second trimester: Secondary | ICD-10-CM | POA: Diagnosis not present

## 2016-01-29 DIAGNOSIS — O09219 Supervision of pregnancy with history of pre-term labor, unspecified trimester: Secondary | ICD-10-CM

## 2016-01-29 DIAGNOSIS — Z3A28 28 weeks gestation of pregnancy: Secondary | ICD-10-CM | POA: Diagnosis not present

## 2016-01-29 DIAGNOSIS — O99343 Other mental disorders complicating pregnancy, third trimester: Secondary | ICD-10-CM | POA: Insufficient documentation

## 2016-01-29 DIAGNOSIS — F209 Schizophrenia, unspecified: Secondary | ICD-10-CM

## 2016-01-29 DIAGNOSIS — O09213 Supervision of pregnancy with history of pre-term labor, third trimester: Secondary | ICD-10-CM | POA: Diagnosis not present

## 2016-01-29 DIAGNOSIS — O09892 Supervision of other high risk pregnancies, second trimester: Secondary | ICD-10-CM

## 2016-01-29 DIAGNOSIS — O09899 Supervision of other high risk pregnancies, unspecified trimester: Secondary | ICD-10-CM

## 2016-01-29 NOTE — ED Notes (Signed)
Pt reports a small amount of clear, watery, wetness with a slight odor x 2 days.

## 2016-02-04 ENCOUNTER — Ambulatory Visit (INDEPENDENT_AMBULATORY_CARE_PROVIDER_SITE_OTHER): Payer: Medicaid Other | Admitting: Family

## 2016-02-04 VITALS — BP 113/73 | HR 73 | Wt 127.9 lb

## 2016-02-04 DIAGNOSIS — O0993 Supervision of high risk pregnancy, unspecified, third trimester: Secondary | ICD-10-CM

## 2016-02-04 DIAGNOSIS — O09213 Supervision of pregnancy with history of pre-term labor, third trimester: Secondary | ICD-10-CM | POA: Diagnosis present

## 2016-02-04 DIAGNOSIS — O26893 Other specified pregnancy related conditions, third trimester: Secondary | ICD-10-CM | POA: Diagnosis not present

## 2016-02-04 DIAGNOSIS — R309 Painful micturition, unspecified: Secondary | ICD-10-CM | POA: Diagnosis not present

## 2016-02-04 DIAGNOSIS — O09893 Supervision of other high risk pregnancies, third trimester: Secondary | ICD-10-CM

## 2016-02-04 DIAGNOSIS — R300 Dysuria: Principal | ICD-10-CM

## 2016-02-04 LAB — POCT URINALYSIS DIP (DEVICE)
Bilirubin Urine: NEGATIVE
GLUCOSE, UA: NEGATIVE mg/dL
Hgb urine dipstick: NEGATIVE
KETONES UR: NEGATIVE mg/dL
LEUKOCYTES UA: NEGATIVE
Nitrite: NEGATIVE
PROTEIN: NEGATIVE mg/dL
SPECIFIC GRAVITY, URINE: 1.02 (ref 1.005–1.030)
Urobilinogen, UA: 1 mg/dL (ref 0.0–1.0)
pH: 6 (ref 5.0–8.0)

## 2016-02-04 MED ORDER — NITROFURANTOIN MONOHYD MACRO 100 MG PO CAPS: 100.0000 mg | ORAL_CAPSULE | Freq: Two times a day (BID) | ORAL | 0 refills | Status: DC

## 2016-02-04 NOTE — Progress Notes (Signed)
Video Interpreter # [id number]  Pt c/o burning with urination

## 2016-02-04 NOTE — Progress Notes (Signed)
   PRENATAL VISIT NOTE  Subjective:  Amy Mcclain is a 30 y.o. G4P2102 at 635w6d being seen today for ongoing prenatal care.  She is currently monitored for the following issues for this high-risk pregnancy and has Schizophrenia (HCC); Severe recurrent major depression with psychotic features (HCC); Involuntary commitment; Supervision of high risk pregnancy, antepartum; History of preterm delivery, currently pregnant; Prior fetal demise, currently not pregnant; and Mental disorder affecting pregnancy, antepartum on her problem list.  Patient reports dysuria two days ago, none today.  Contractions: Irritability. Vag. Bleeding: None.  Movement: Present. Denies leaking of fluid.   The following portions of the patient's history were reviewed and updated as appropriate: allergies, current medications, past family history, past medical history, past social history, past surgical history and problem list. Problem list updated.  Objective:   Vitals:   02/04/16 1024  BP: 113/73  Pulse: 73  Weight: 127 lb 14.4 oz (58 kg)    Fetal Status: Fetal Heart Rate (bpm): 155 Fundal Height: 29 cm Movement: Present     General:  Alert, oriented and cooperative. Patient is in no acute distress.  Skin: Skin is warm and dry. No rash noted.   Cardiovascular: Normal heart rate noted  Respiratory: Normal respiratory effort, no problems with respiration noted  Abdomen: Soft, gravid, appropriate for gestational age. Pain/Pressure: Present     Pelvic:  Cervical exam deferred        Extremities: Normal range of motion.  Edema: Trace  Mental Status: Normal mood and affect. Normal behavior. Normal judgment and thought content.   Urinalysis: Urine Protein: Negative Urine Glucose: Negative  Assessment and Plan:  Pregnancy: [id number] at 705w6d  1. Dysuria during pregnancy, third trimester - Culture, OB Urine  2.  History of preterm delivery, currently pregnant, third trimester - 17p today  Preterm labor symptoms and  general obstetric precautions including but not limited to vaginal bleeding, contractions, leaking of fluid and fetal movement were reviewed in detail with the patient. Please refer to After Visit Summary for other counseling recommendations.  Return in about 2 weeks (around 02/18/2016) for 17p weekly.  Eino FarberWalidah Kennith GainN Karim, CNM

## 2016-02-05 LAB — CULTURE, OB URINE: ORGANISM ID, BACTERIA: NO GROWTH

## 2016-02-18 ENCOUNTER — Ambulatory Visit (INDEPENDENT_AMBULATORY_CARE_PROVIDER_SITE_OTHER): Payer: Medicaid Other | Admitting: *Deleted

## 2016-02-18 VITALS — BP 103/52 | HR 92

## 2016-02-18 DIAGNOSIS — O09893 Supervision of other high risk pregnancies, third trimester: Secondary | ICD-10-CM

## 2016-02-18 DIAGNOSIS — O09213 Supervision of pregnancy with history of pre-term labor, third trimester: Secondary | ICD-10-CM | POA: Diagnosis present

## 2016-02-18 NOTE — Progress Notes (Signed)
Here for 17 P.  Used video Stage managerinterpreter Ammar [id number].  Denies any problems . Questions answered.

## 2016-02-23 ENCOUNTER — Ambulatory Visit (INDEPENDENT_AMBULATORY_CARE_PROVIDER_SITE_OTHER): Payer: Medicaid Other | Admitting: Obstetrics and Gynecology

## 2016-02-23 VITALS — BP 112/69 | HR 103 | Wt 129.0 lb

## 2016-02-23 DIAGNOSIS — O09893 Supervision of other high risk pregnancies, third trimester: Secondary | ICD-10-CM

## 2016-02-23 DIAGNOSIS — O09213 Supervision of pregnancy with history of pre-term labor, third trimester: Secondary | ICD-10-CM

## 2016-02-23 DIAGNOSIS — Z046 Encounter for general psychiatric examination, requested by authority: Secondary | ICD-10-CM

## 2016-02-23 DIAGNOSIS — Z789 Other specified health status: Secondary | ICD-10-CM

## 2016-02-23 DIAGNOSIS — O0993 Supervision of high risk pregnancy, unspecified, third trimester: Secondary | ICD-10-CM

## 2016-02-23 DIAGNOSIS — Z603 Acculturation difficulty: Secondary | ICD-10-CM | POA: Insufficient documentation

## 2016-02-23 NOTE — Progress Notes (Signed)
Prenatal Visit Note Date: 02/23/2016 [facility]  Subjective:  Amy Mcclain is a 30 y.o. [phone] at 254w4d being seen today for ongoing prenatal care.  She is currently monitored for the following issues for this high-risk pregnancy and has Schizophrenia (HCC); Severe recurrent major depression with psychotic features (HCC); Involuntary commitment; Supervision of high risk pregnancy, antepartum; History of preterm delivery, currently pregnant; Prior fetal demise, currently not pregnant; Mental disorder affecting pregnancy, antepartum; and Language barrier affecting health care on her problem list.  Patient reports no complaints.   Contractions: Irritability.  .  Movement: Present. Denies leaking of fluid.   The following portions of the patient's history were reviewed and updated as appropriate: allergies, current medications, past family history, past medical history, past social history, past surgical history and problem list. Problem list updated.  Objective:   Vitals:   02/23/16 0920  BP: 112/69  Pulse: (!) 103  Weight: 129 lb (58.5 kg)    Fetal Status: Fetal Heart Rate (bpm): 141 Fundal Height: 31 cm Movement: Present     General:  Alert, oriented and cooperative. Patient is in no acute distress.  Skin: Skin is warm and dry. No rash noted.   Cardiovascular: Normal heart rate noted  Respiratory: Normal respiratory effort, no problems with respiration noted  Abdomen: Soft, gravid, appropriate for gestational age. Pain/Pressure: Present     Pelvic:  Cervical exam deferred        Extremities: Normal range of motion.     Mental Status: Normal mood and affect. Normal behavior. Normal judgment and thought content.   Urinalysis:      Assessment and Plan:  Pregnancy: [id number] at 6054w4d  1. Supervision of high risk pregnancy, antepartum, third trimester Routine care. FHs normal. Continue to follow closely. U/s one month ago normal CL and EFW/AC/AFI. Can d/w  her re: BTL nv.   2. Involuntary commitment Last visit at [facility] two weeks ago and pt states mood is good. D/w her to continue to see them for close follow up even though she is feeling well.   3. History of preterm delivery, currently pregnant, third trimester On 17p. Will try and put on ROB weds schedule to line up with shots  4. Language barrier affecting health care intepreter used.  Preterm labor symptoms and general obstetric precautions including but not limited to vaginal bleeding, contractions, leaking of fluid and fetal movement were reviewed in detail with the patient. Please refer to After Visit Summary for other counseling recommendations.  Return in about 2 days (around 02/25/2016).   [name], MD

## 2016-02-24 ENCOUNTER — Encounter: Payer: Self-pay | Admitting: Obstetrics and Gynecology

## 2016-02-24 DIAGNOSIS — Z9289 Personal history of other medical treatment: Secondary | ICD-10-CM | POA: Insufficient documentation

## 2016-02-25 ENCOUNTER — Ambulatory Visit (INDEPENDENT_AMBULATORY_CARE_PROVIDER_SITE_OTHER): Payer: Medicaid Other | Admitting: *Deleted

## 2016-02-25 VITALS — BP 110/66 | Wt 128.7 lb

## 2016-02-25 DIAGNOSIS — O09893 Supervision of other high risk pregnancies, third trimester: Secondary | ICD-10-CM

## 2016-02-25 DIAGNOSIS — O09213 Supervision of pregnancy with history of pre-term labor, third trimester: Secondary | ICD-10-CM | POA: Diagnosis present

## 2016-03-03 ENCOUNTER — Ambulatory Visit (INDEPENDENT_AMBULATORY_CARE_PROVIDER_SITE_OTHER): Payer: Medicaid Other | Admitting: Obstetrics and Gynecology

## 2016-03-03 VITALS — BP 112/64 | HR 102 | Wt 129.9 lb

## 2016-03-03 DIAGNOSIS — O09219 Supervision of pregnancy with history of pre-term labor, unspecified trimester: Principal | ICD-10-CM

## 2016-03-03 DIAGNOSIS — O09213 Supervision of pregnancy with history of pre-term labor, third trimester: Secondary | ICD-10-CM | POA: Diagnosis present

## 2016-03-03 DIAGNOSIS — O0990 Supervision of high risk pregnancy, unspecified, unspecified trimester: Secondary | ICD-10-CM

## 2016-03-03 DIAGNOSIS — Z23 Encounter for immunization: Secondary | ICD-10-CM | POA: Diagnosis not present

## 2016-03-03 DIAGNOSIS — O99343 Other mental disorders complicating pregnancy, third trimester: Secondary | ICD-10-CM | POA: Diagnosis not present

## 2016-03-03 DIAGNOSIS — O99340 Other mental disorders complicating pregnancy, unspecified trimester: Secondary | ICD-10-CM

## 2016-03-03 DIAGNOSIS — Z789 Other specified health status: Secondary | ICD-10-CM

## 2016-03-03 DIAGNOSIS — Z758 Other problems related to medical facilities and other health care: Secondary | ICD-10-CM

## 2016-03-03 DIAGNOSIS — O09899 Supervision of other high risk pregnancies, unspecified trimester: Secondary | ICD-10-CM

## 2016-03-03 NOTE — Addendum Note (Signed)
Addended by: Garret ReddishBARNES, Shaneika Rossa M on: 03/03/2016 09:28 AM   Modules accepted: Orders

## 2016-03-03 NOTE — Progress Notes (Signed)
Arabic video interpreter "Tahani" [id number] 17-p , flu vaccine today Patient reporting dark brown discharge

## 2016-03-03 NOTE — Progress Notes (Signed)
Prenatal Visit Note Date: 03/03/2016 [facility]  Subjective:  Amy Mcclain is a 30 y.o. [phone] at 6361w6d being seen today for ongoing prenatal care.  She is currently monitored for the following issues for this high-risk pregnancy and has Schizophrenia (HCC); Severe recurrent major depression with psychotic features (HCC); Involuntary commitment; Supervision of high risk pregnancy, antepartum; History of preterm delivery, currently pregnant; Prior fetal demise, currently not pregnant; Mental disorder affecting pregnancy, antepartum; Language barrier affecting health care; and History of RPR test on her problem list.  Patient reports occasional vag d/c. No itching or dysuria.  Contractions: Not present. Vag. Bleeding: None.  Movement: Present. Denies leaking of fluid.   The following portions of the patient's history were reviewed and updated as appropriate: allergies, current medications, past family history, past medical history, past social history, past surgical history and problem list. Problem list updated.  Objective:   Vitals:   03/03/16 0810  BP: 112/64  Pulse: (!) 102  Weight: 129 lb 14.4 oz (58.9 kg)    Fetal Status: Fetal Heart Rate (bpm): 143 Fundal Height: 33 cm Movement: Present  Presentation: Vertex  General:  Alert, oriented and cooperative. Patient is in no acute distress.  Skin: Skin is warm and dry. No rash noted.   Cardiovascular: Normal heart rate noted  Respiratory: Normal respiratory effort, no problems with respiration noted  Abdomen: Soft, gravid, appropriate for gestational age. Pain/Pressure: Present     Pelvic:  EGBUS normal. spec exam with normal minimal white d/c of pregnancy. cx visually closed. feels cl/long/high        Extremities: Normal range of motion.  Edema: None  Mental Status: Normal mood and affect. Normal behavior. Normal judgment and thought content.   Urinalysis:      Assessment and Plan:  Pregnancy: [id number] at  6561w6d  1. Supervision of high risk pregnancy, antepartum Pt doing well. Routine care.  2. History of preterm delivery, currently pregnant 17p today and continue with qwk shots until 37wks  3. Language barrier affecting health care Interpreter used  4. Mental disorder affecting pregnancy, antepartum Pt feels well and doesn't feel like she needs to go to [facility]. Her husband and I agree it's best if she goes even if she feels well. This was encouraged to her.   Preterm labor symptoms and general obstetric precautions including but not limited to vaginal bleeding, contractions, leaking of fluid and fetal movement were reviewed in detail with the patient. Please refer to After Visit Summary for other counseling recommendations 1wk 17p only.  Return in about 2 weeks (around 03/17/2016). for 17p and rob   Guys Bingharlie Shriya Aker, MD

## 2016-03-10 ENCOUNTER — Ambulatory Visit (INDEPENDENT_AMBULATORY_CARE_PROVIDER_SITE_OTHER): Payer: Medicaid Other | Admitting: *Deleted

## 2016-03-10 VITALS — BP 112/74 | HR 101 | Wt 130.7 lb

## 2016-03-10 DIAGNOSIS — O09219 Supervision of pregnancy with history of pre-term labor, unspecified trimester: Principal | ICD-10-CM

## 2016-03-10 DIAGNOSIS — O09213 Supervision of pregnancy with history of pre-term labor, third trimester: Secondary | ICD-10-CM | POA: Diagnosis present

## 2016-03-10 DIAGNOSIS — O09899 Supervision of other high risk pregnancies, unspecified trimester: Secondary | ICD-10-CM

## 2016-03-19 ENCOUNTER — Telehealth: Payer: Self-pay | Admitting: *Deleted

## 2016-03-19 ENCOUNTER — Ambulatory Visit (INDEPENDENT_AMBULATORY_CARE_PROVIDER_SITE_OTHER): Payer: Medicaid Other | Admitting: Family Medicine

## 2016-03-19 ENCOUNTER — Other Ambulatory Visit (HOSPITAL_COMMUNITY)
Admission: RE | Admit: 2016-03-19 | Discharge: 2016-03-19 | Disposition: A | Discharge status: Home / Self Care | Payer: Medicaid Other | Source: Ambulatory Visit | Attending: Family Medicine | Admitting: Family Medicine

## 2016-03-19 VITALS — BP 118/73 | HR 118 | Wt 130.3 lb

## 2016-03-19 DIAGNOSIS — O09892 Supervision of other high risk pregnancies, second trimester: Secondary | ICD-10-CM

## 2016-03-19 DIAGNOSIS — O0993 Supervision of high risk pregnancy, unspecified, third trimester: Secondary | ICD-10-CM

## 2016-03-19 DIAGNOSIS — R12 Heartburn: Secondary | ICD-10-CM

## 2016-03-19 DIAGNOSIS — O26893 Other specified pregnancy related conditions, third trimester: Secondary | ICD-10-CM

## 2016-03-19 DIAGNOSIS — O09212 Supervision of pregnancy with history of pre-term labor, second trimester: Secondary | ICD-10-CM

## 2016-03-19 DIAGNOSIS — Z113 Encounter for screening for infections with a predominantly sexual mode of transmission: Secondary | ICD-10-CM | POA: Insufficient documentation

## 2016-03-19 DIAGNOSIS — O99340 Other mental disorders complicating pregnancy, unspecified trimester: Secondary | ICD-10-CM

## 2016-03-19 NOTE — Progress Notes (Signed)
Needs 17-p C/o acid reflux Cultures today

## 2016-03-19 NOTE — Telephone Encounter (Signed)
Patient left office today without receiving her 17-p injection. Attempted to call patient using Arabic interpreter. Message left on voice mail stating that if she can return today before noon for her injection that would be best, otherwise she can come in on Monday. For questions, call the clinic.

## 2016-03-19 NOTE — Progress Notes (Signed)
   PRENATAL VISIT NOTE  Subjective:  Karel JarvisHedaya Karie Mainlandli is a 30 y.o. [id number] at 774w1d being seen today for ongoing prenatal care.  She is currently monitored for the following issues for this high-risk pregnancy and has Schizophrenia (HCC); Severe recurrent major depression with psychotic features (HCC); Involuntary commitment; Supervision of high risk pregnancy, antepartum; History of preterm delivery, currently pregnant; Prior fetal demise, currently not pregnant; Mental disorder affecting pregnancy, antepartum; Language barrier affecting health care; and History of RPR test on her problem list.  Patient reports headache. Hasn't tried anything. Contractions: Not present. Vag. Bleeding: None.  Movement: Present. Denies leaking of fluid.   History of PTL - on 17 -P. Some soreness after injections. No contractions or vaginal pressure. Has one injection left.  Psych: not seeing [facility]. Denies depression currently.  The following portions of the patient's history were reviewed and updated as appropriate: allergies, current medications, past family history, past medical history, past social history, past surgical history and problem list. Problem list updated.  Objective:   Vitals:   03/19/16 0950  BP: 118/73  Pulse: (!) 118  Weight: 130 lb 4.8 oz (59.1 kg)    Fetal Status: Fetal Heart Rate (bpm): 151   Movement: Present     General:  Alert, oriented and cooperative. Patient is in no acute distress.  Skin: Skin is warm and dry. No rash noted.   Cardiovascular: Normal heart rate noted  Respiratory: Normal respiratory effort, no problems with respiration noted  Abdomen: Soft, gravid, appropriate for gestational age. Pain/Pressure: Present     Pelvic:  Cervical exam deferred        Extremities: Normal range of motion.     Mental Status: Normal mood and affect. Normal behavior. Normal judgment and thought content.   Assessment and Plan:  Pregnancy: [id number] at 6574w1d  1. Supervision of high risk  pregnancy in third trimester - Culture, beta strep (group b only) - [facility]Chlamydia probe amp ([facility]  2. History of preterm delivery, currently pregnant, second trimester Continue 17-P. Last injection next week.  3. Mental disorder affecting pregnancy, antepartum Stable  4. Heartburn during pregnancy in third trimester Tums. If not effective, pt to call.  Preterm labor symptoms and general obstetric precautions including but not limited to vaginal bleeding, contractions, leaking of fluid and fetal movement were reviewed in detail with the patient. Please refer to After Visit Summary for other counseling recommendations.  Return in about 1 week (around 03/26/2016) for OB f/u.  Levie HeritageJacob J Stinson, DO

## 2016-03-21 LAB — CULTURE, BETA STREP (GROUP B ONLY)

## 2016-03-22 LAB — GC/CHLAMYDIA PROBE AMP (~~LOC~~) NOT AT ARMC
Chlamydia: NEGATIVE
Neisseria Gonorrhea: NEGATIVE

## 2016-03-24 ENCOUNTER — Telehealth: Payer: Self-pay | Admitting: Family Medicine

## 2016-03-24 NOTE — Telephone Encounter (Signed)
Patient has appointment scheduled for 03/31/2016

## 2016-03-24 NOTE — Telephone Encounter (Signed)
Called patient with language interpreter to give her an appointment for follow-up care.

## 2016-03-26 LAB — OB RESULTS CONSOLE GBS: STREP GROUP B AG: NEGATIVE

## 2016-03-31 ENCOUNTER — Ambulatory Visit (INDEPENDENT_AMBULATORY_CARE_PROVIDER_SITE_OTHER): Payer: Medicaid Other | Admitting: Obstetrics and Gynecology

## 2016-03-31 VITALS — BP 113/68 | HR 100 | Wt 130.1 lb

## 2016-03-31 DIAGNOSIS — O0990 Supervision of high risk pregnancy, unspecified, unspecified trimester: Secondary | ICD-10-CM

## 2016-03-31 DIAGNOSIS — O99340 Other mental disorders complicating pregnancy, unspecified trimester: Secondary | ICD-10-CM

## 2016-03-31 DIAGNOSIS — F2089 Other schizophrenia: Secondary | ICD-10-CM

## 2016-03-31 DIAGNOSIS — O09212 Supervision of pregnancy with history of pre-term labor, second trimester: Secondary | ICD-10-CM

## 2016-03-31 DIAGNOSIS — O09219 Supervision of pregnancy with history of pre-term labor, unspecified trimester: Secondary | ICD-10-CM

## 2016-03-31 DIAGNOSIS — O09899 Supervision of other high risk pregnancies, unspecified trimester: Secondary | ICD-10-CM

## 2016-03-31 DIAGNOSIS — O09892 Supervision of other high risk pregnancies, second trimester: Secondary | ICD-10-CM

## 2016-03-31 NOTE — Progress Notes (Signed)
   PRENATAL VISIT NOTE  Subjective:  Amy Mcclain is a 30 y.o. [id number] at 6341w6d being seen today for ongoing prenatal care.  She is currently monitored for the following issues for this high-risk pregnancy and has Schizophrenia (HCC); Severe recurrent major depression with psychotic features (HCC); Involuntary commitment; Supervision of high risk pregnancy, antepartum; History of preterm delivery, currently pregnant; Prior fetal demise, currently not pregnant; Mental disorder affecting pregnancy, antepartum; Language barrier affecting health care; and History of RPR test on her problem list.  Patient reports legs heavy with difficulty walking at times.  Contractions: Irregular. Vag. Bleeding: None.  Movement: Present. Denies leaking of fluid.   The following portions of the patient's history were reviewed and updated as appropriate: allergies, current medications, past family history, past medical history, past social history, past surgical history and problem list. Problem list updated.  Objective:   Vitals:   03/31/16 1304  BP: 113/68  Pulse: 100  Weight: 130 lb 1.6 oz (59 kg)    Fetal Status: Fetal Heart Rate (bpm): 138   Movement: Present     General:  Alert, oriented and cooperative. Patient is in no acute distress.  Skin: Skin is warm and dry. No rash noted.   Cardiovascular: Normal heart rate noted  Respiratory: Normal respiratory effort, no problems with respiration noted  Abdomen: Soft, gravid, appropriate for gestational age. Pain/Pressure: Present     Pelvic: 1/thick/ballotable        Extremities: Normal range of motion.  Edema: Trace  Mental Status: Normal mood and affect. Normal behavior. Normal judgment and thought content.   Assessment and Plan:  Pregnancy: [id number] at 5341w6d  1. Supervision of high risk pregnancy, antepartum Doing well  2. History of preterm delivery, currently pregnant Supervision of high risk pregnancy, antepartum - Plan: CANCELED: Culture, beta  strep (group b only), CANCELED: GC/Chlamydia Probe Amp  History of preterm delivery, currently pregnant  History of preterm delivery, currently pregnant, second trimester  Mental disorder affecting pregnancy, antepartum Stable  (Will not give 17-P today) Term labor symptoms and general obstetric precautions including but not limited to vaginal bleeding, contractions, leaking of fluid and fetal movement were reviewed in detail with the patient. Please refer to After Visit Summary for other counseling recommendations.  Return in about 1 week (around 04/07/2016).  Danae Orleanseirdre C Kalliopi Coupland, CNM

## 2016-03-31 NOTE — Progress Notes (Signed)
Used Video interpreter Zina [id number]. C/o legs feel heavy sometimes, hard to walk. C/o epigastric pain sometimes.

## 2016-03-31 NOTE — Patient Instructions (Addendum)
Third Trimester of Pregnancy The third trimester is from week 29 through week 42, months 7 through 9. The third trimester is a time when the fetus is growing rapidly. At the end of the ninth month, the fetus is about 20 inches in length and weighs 6-10 pounds.  BODY CHANGES Your body goes through many changes during pregnancy. The changes vary from woman to woman.   Your weight will continue to increase. You can expect to gain 25-35 pounds (11-16 kg) by the end of the pregnancy.  You may begin to get stretch marks on your hips, abdomen, and breasts.  You may urinate more often because the fetus is moving lower into your pelvis and pressing on your bladder.  You may develop or continue to have heartburn as a result of your pregnancy.  You may develop constipation because certain hormones are causing the muscles that push waste through your intestines to slow down.  You may develop hemorrhoids or swollen, bulging veins (varicose veins).  You may have pelvic pain because of the weight gain and pregnancy hormones relaxing your joints between the bones in your pelvis. Backaches may result from overexertion of the muscles supporting your posture.  You may have changes in your hair. These can include thickening of your hair, rapid growth, and changes in texture. Some women also have hair loss during or after pregnancy, or hair that feels dry or thin. Your hair will most likely return to normal after your baby is born.  Your breasts will continue to grow and be tender. A yellow discharge may leak from your breasts called colostrum.  Your belly button may stick out.  You may feel short of breath because of your expanding uterus.  You may notice the fetus "dropping," or moving lower in your abdomen.  You may have a bloody mucus discharge. This usually occurs a few days to a week before labor begins.  Your cervix becomes thin and soft (effaced) near your due date. WHAT TO EXPECT AT YOUR PRENATAL  EXAMS  You will have prenatal exams every 2 weeks until week 36. Then, you will have weekly prenatal exams. During a routine prenatal visit:  You will be weighed to make sure you and the fetus are growing normally.  Your blood pressure is taken.  Your abdomen will be measured to track your baby's growth.  The fetal heartbeat will be listened to.  Any test results from the previous visit will be discussed.  You may have a cervical check near your due date to see if you have effaced. At around 36 weeks, your caregiver will check your cervix. At the same time, your caregiver will also perform a test on the secretions of the vaginal tissue. This test is to determine if a type of bacteria, Group B streptococcus, is present. Your caregiver will explain this further. Your caregiver may ask you:  What your birth plan is.  How you are feeling.  If you are feeling the baby move.  If you have had any abnormal symptoms, such as leaking fluid, bleeding, severe headaches, or abdominal cramping.  If you are using any tobacco products, including cigarettes, chewing tobacco, and electronic cigarettes.  If you have any questions. Other tests or screenings that may be performed during your third trimester include:  Blood tests that check for low iron levels (anemia).  Fetal testing to check the health, activity level, and growth of the fetus. Testing is done if you have certain medical conditions or if   there are problems during the pregnancy.  HIV (human immunodeficiency virus) testing. If you are at high risk, you may be screened for HIV during your third trimester of pregnancy. FALSE LABOR You may feel small, irregular contractions that eventually go away. These are called Braxton Hicks contractions, or false labor. Contractions may last for hours, days, or even weeks before true labor sets in. If contractions come at regular intervals, intensify, or become painful, it is best to be seen by your  caregiver.  SIGNS OF LABOR   Menstrual-like cramps.  Contractions that are 5 minutes apart or less.  Contractions that start on the top of the uterus and spread down to the lower abdomen and back.  A sense of increased pelvic pressure or back pain.  A watery or bloody mucus discharge that comes from the vagina. If you have any of these signs before the 37th week of pregnancy, call your caregiver right away. You need to go to the hospital to get checked immediately. HOME CARE INSTRUCTIONS   Avoid all smoking, herbs, alcohol, and unprescribed drugs. These chemicals affect the formation and growth of the baby.  Do not use any tobacco products, including cigarettes, chewing tobacco, and electronic cigarettes. If you need help quitting, ask your health care provider. You may receive counseling support and other resources to help you quit.  Follow your caregiver's instructions regarding medicine use. There are medicines that are either safe or unsafe to take during pregnancy.  Exercise only as directed by your caregiver. Experiencing uterine cramps is a good sign to stop exercising.  Continue to eat regular, healthy meals.  Wear a good support bra for breast tenderness.  Do not use hot tubs, steam rooms, or saunas.  Wear your seat belt at all times when driving.  Avoid raw meat, uncooked cheese, cat litter boxes, and soil used by cats. These carry germs that can cause birth defects in the baby.  Take your prenatal vitamins.  Take 1500-2000 mg of calcium daily starting at the 20th week of pregnancy until you deliver your baby.  Try taking a stool softener (if your caregiver approves) if you develop constipation. Eat more high-fiber foods, such as fresh vegetables or fruit and whole grains. Drink plenty of fluids to keep your urine clear or pale yellow.  Take warm sitz baths to soothe any pain or discomfort caused by hemorrhoids. Use hemorrhoid cream if your caregiver approves.  If  you develop varicose veins, wear support hose. Elevate your feet for 15 minutes, 3-4 times a day. Limit salt in your diet.  Avoid heavy lifting, wear low heal shoes, and practice good posture.  Rest a lot with your legs elevated if you have leg cramps or low back pain.  Visit your dentist if you have not gone during your pregnancy. Use a soft toothbrush to brush your teeth and be gentle when you floss.  A sexual relationship may be continued unless your caregiver directs you otherwise.  Do not travel far distances unless it is absolutely necessary and only with the approval of your caregiver.  Take prenatal classes to understand, practice, and ask questions about the labor and delivery.  Make a trial run to the hospital.  Pack your hospital bag.  Prepare the baby's nursery.  Continue to go to all your prenatal visits as directed by your caregiver. SEEK MEDICAL CARE IF:  You are unsure if you are in labor or if your water has broken.  You have dizziness.  You have   mild pelvic cramps, pelvic pressure, or nagging pain in your abdominal area.  You have persistent nausea, vomiting, or diarrhea.  You have a bad smelling vaginal discharge.  You have pain with urination. SEEK IMMEDIATE MEDICAL CARE IF:   You have a fever.  You are leaking fluid from your vagina.  You have spotting or bleeding from your vagina.  You have severe abdominal cramping or pain.  You have rapid weight loss or gain.  You have shortness of breath with chest pain.  You notice sudden or extreme swelling of your face, hands, ankles, feet, or legs.  You have not felt your baby move in over an hour.  You have severe headaches that do not go away with medicine.  You have vision changes.   This information is not intended to replace advice given to you by your health care provider. Make sure you discuss any questions you have with your health care provider.   Document Released: 05/11/2001 Document  Revised: 06/07/2014 Document Reviewed: 07/18/2012 Elsevier Interactive Patient Education 2016 [employer] Contraception Choices Contraception (birth control) is the use of any methods or devices to prevent pregnancy. Below are some methods to help avoid pregnancy. HORMONAL METHODS   Contraceptive implant. This is a thin, plastic tube containing progesterone hormone. It does not contain estrogen hormone. Your health care provider inserts the tube in the inner part of the upper arm. The tube can remain in place for up to 3 years. After 3 years, the implant must be removed. The implant prevents the ovaries from releasing an egg (ovulation), thickens the cervical mucus to prevent sperm from entering the uterus, and thins the lining of the inside of the uterus.  Progesterone-only injections. These injections are given every 3 months by your health care provider to prevent pregnancy. This synthetic progesterone hormone stops the ovaries from releasing eggs. It also thickens cervical mucus and changes the uterine lining. This makes it harder for sperm to survive in the uterus.  Birth control pills. These pills contain estrogen and progesterone hormone. They work by preventing the ovaries from releasing eggs (ovulation). They also cause the cervical mucus to thicken, preventing the sperm from entering the uterus. Birth control pills are prescribed by a health care provider.Birth control pills can also be used to treat heavy periods.  Minipill. This type of birth control pill contains only the progesterone hormone. They are taken every day of each month and must be prescribed by your health care provider.  Birth control patch. The patch contains hormones similar to those in birth control pills. It must be changed once a week and is prescribed by a health care provider.  Vaginal ring. The ring contains hormones similar to those in birth control pills. It is left in the vagina for 3 weeks, removed for 1  week, and then a new one is put back in place. The patient must be comfortable inserting and removing the ring from the vagina.A health care provider's prescription is necessary.  Emergency contraception. Emergency contraceptives prevent pregnancy after unprotected sexual intercourse. This pill can be taken right after sex or up to 5 days after unprotected sex. It is most effective the sooner you take the pills after having sexual intercourse. Most emergency contraceptive pills are available without a prescription. Check with your pharmacist. Do not use emergency contraception as your only form of birth control. BARRIER METHODS   Female condom. This is a thin sheath (latex or rubber) that is worn over the penis during   sexual intercourse. It can be used with spermicide to increase effectiveness.  Female condom. This is a soft, loose-fitting sheath that is put into the vagina before sexual intercourse.  Diaphragm. This is a soft, latex, dome-shaped barrier that must be fitted by a health care provider. It is inserted into the vagina, along with a spermicidal jelly. It is inserted before intercourse. The diaphragm should be left in the vagina for 6 to 8 hours after intercourse.  Cervical cap. This is a round, soft, latex or plastic cup that fits over the cervix and must be fitted by a health care provider. The cap can be left in place for up to 48 hours after intercourse.  Sponge. This is a soft, circular piece of polyurethane foam. The sponge has spermicide in it. It is inserted into the vagina after wetting it and before sexual intercourse.  Spermicides. These are chemicals that kill or block sperm from entering the cervix and uterus. They come in the form of creams, jellies, suppositories, foam, or tablets. They do not require a prescription. They are inserted into the vagina with an applicator before having sexual intercourse. The process must be repeated every time you have sexual  intercourse. INTRAUTERINE CONTRACEPTION  Intrauterine device (IUD). This is a T-shaped device that is put in a woman's uterus during a menstrual period to prevent pregnancy. There are 2 types:  Copper IUD. This type of IUD is wrapped in copper wire and is placed inside the uterus. Copper makes the uterus and fallopian tubes produce a fluid that kills sperm. It can stay in place for 10 years.  Hormone IUD. This type of IUD contains the hormone progestin (synthetic progesterone). The hormone thickens the cervical mucus and prevents sperm from entering the uterus, and it also thins the uterine lining to prevent implantation of a fertilized egg. The hormone can weaken or kill the sperm that get into the uterus. It can stay in place for 3-5 years, depending on which type of IUD is used. PERMANENT METHODS OF CONTRACEPTION  Female tubal ligation. This is when the woman's fallopian tubes are surgically sealed, tied, or blocked to prevent the egg from traveling to the uterus.  Hysteroscopic sterilization. This involves placing a small coil or insert into each fallopian tube. Your doctor uses a technique called hysteroscopy to do the procedure. The device causes scar tissue to form. This results in permanent blockage of the fallopian tubes, so the sperm cannot fertilize the egg. It takes about 3 months after the procedure for the tubes to become blocked. You must use another form of birth control for these 3 months.  Female sterilization. This is when the female has the tubes that carry sperm tied off (vasectomy).This blocks sperm from entering the vagina during sexual intercourse. After the procedure, the man can still ejaculate fluid (semen). NATURAL PLANNING METHODS  Natural family planning. This is not having sexual intercourse or using a barrier method (condom, diaphragm, cervical cap) on days the woman could become pregnant.  Calendar method. This is keeping track of the length of each menstrual cycle  and identifying when you are fertile.  Ovulation method. This is avoiding sexual intercourse during ovulation.  Symptothermal method. This is avoiding sexual intercourse during ovulation, using a thermometer and ovulation symptoms.  Post-ovulation method. This is timing sexual intercourse after you have ovulated. Regardless of which type or method of contraception you choose, it is important that you use condoms to protect against the transmission of sexually transmitted infections (STIs).   Talk with your health care provider about which form of contraception is most appropriate for you.   This information is not intended to replace advice given to you by your health care provider. Make sure you discuss any questions you have with your health care provider.   Document Released: 05/17/2005 Document Revised: 05/22/2013 Document Reviewed: 11/09/2012 Elsevier Interactive Patient Education 2016 Elsevier Inc.  

## 2016-04-12 ENCOUNTER — Ambulatory Visit (INDEPENDENT_AMBULATORY_CARE_PROVIDER_SITE_OTHER): Payer: Medicaid Other | Admitting: Obstetrics and Gynecology

## 2016-04-12 VITALS — BP 117/75 | HR 110 | Wt 132.1 lb

## 2016-04-12 DIAGNOSIS — Z789 Other specified health status: Secondary | ICD-10-CM

## 2016-04-12 DIAGNOSIS — O99343 Other mental disorders complicating pregnancy, third trimester: Secondary | ICD-10-CM

## 2016-04-12 DIAGNOSIS — F333 Major depressive disorder, recurrent, severe with psychotic symptoms: Secondary | ICD-10-CM

## 2016-04-12 DIAGNOSIS — Z9289 Personal history of other medical treatment: Secondary | ICD-10-CM

## 2016-04-12 DIAGNOSIS — O0990 Supervision of high risk pregnancy, unspecified, unspecified trimester: Secondary | ICD-10-CM

## 2016-04-12 LAB — POCT URINALYSIS DIP (DEVICE)
Bilirubin Urine: NEGATIVE
Glucose, UA: NEGATIVE mg/dL
HGB URINE DIPSTICK: NEGATIVE
Ketones, ur: NEGATIVE mg/dL
Nitrite: NEGATIVE
PH: 6 (ref 5.0–8.0)
Protein, ur: NEGATIVE mg/dL
SPECIFIC GRAVITY, URINE: 1.015 (ref 1.005–1.030)
UROBILINOGEN UA: 1 mg/dL (ref 0.0–1.0)

## 2016-04-12 NOTE — Progress Notes (Signed)
Prenatal Visit Note Date: 04/12/2016 Clinic: [facility]  Subjective:  Amy Mcclain is a 30 y.o. 401 [id number] at 4536w4d being seen today for ongoing prenatal care.  She is currently monitored for the following issues for this high-risk pregnancy and has Schizophrenia (HCC); Severe recurrent major depression with psychotic features (HCC); Involuntary commitment; Supervision of high risk pregnancy, antepartum; Neonatal death, PTB @ 24wks; Mental disorder affecting pregnancy, antepartum; Language barrier affecting health care; and History of RPR test on her problem list.  Patient reports no complaints.   Contractions: Irregular. Vag. Bleeding: None.  Movement: Present. Denies leaking of fluid.   The following portions of the patient's history were reviewed and updated as appropriate: allergies, current medications, past family history, past medical history, past social history, past surgical history and problem list. Problem list updated.  Objective:   Vitals:   04/12/16 0848  BP: 117/75  Pulse: (!) 110  Weight: 132 lb 1.6 oz (59.9 kg)    Fetal Status: Fetal Heart Rate (bpm): 145 Fundal Height: 37 cm Movement: Present  Presentation: Vertex  General:  Alert, oriented and cooperative. Patient is in no acute distress.  Skin: Skin is warm and dry. No rash noted.   Cardiovascular: Normal heart rate noted  Respiratory: Normal respiratory effort, no problems with respiration noted  Abdomen: Soft, gravid, appropriate for gestational age. Pain/Pressure: Absent     Pelvic:  Cervical exam deferred        Extremities: Normal range of motion.  Edema: None  Mental Status: Normal mood and affect. Normal behavior. Normal judgment and thought content.   Urinalysis:      Assessment and Plan:  Pregnancy: [name] at 4036w4d  1. Supervision of high risk pregnancy, antepartum Routine care. GBS neg. Pt undecided on BC  2. Severe recurrent major depression with psychotic features (HCC) No  issues. Currently on no meds; [facility]  3. Neonatal death, PTB @ 24wks N/a. No current issues  4. History of RPR test 1:2 titer at 28wk labs and negative TPA. F/u at admission  5. Language barrier affecting health care Interpreter used  Term labor symptoms and general obstetric precautions including but not limited to vaginal bleeding, contractions, leaking of fluid and fetal movement were reviewed in detail with the patient. Please refer to After Visit Summary for other counseling recommendations.  Return in about 1 week (around 04/19/2016).   Oglesby Bingharlie Rodgers Likes, MD

## 2016-04-19 ENCOUNTER — Ambulatory Visit (INDEPENDENT_AMBULATORY_CARE_PROVIDER_SITE_OTHER): Payer: Medicaid Other | Admitting: Student

## 2016-04-19 VITALS — BP 123/79 | HR 102 | Wt 130.6 lb

## 2016-04-19 DIAGNOSIS — O0990 Supervision of high risk pregnancy, unspecified, unspecified trimester: Secondary | ICD-10-CM

## 2016-04-19 DIAGNOSIS — Z789 Other specified health status: Secondary | ICD-10-CM

## 2016-04-19 DIAGNOSIS — O0993 Supervision of high risk pregnancy, unspecified, third trimester: Secondary | ICD-10-CM

## 2016-04-19 NOTE — Patient Instructions (Signed)
Braxton Hicks Contractions °Contractions of the uterus can occur throughout pregnancy. Contractions are not always a sign that you are in labor.  °WHAT ARE BRAXTON HICKS CONTRACTIONS?  °Contractions that occur before labor are called Braxton Hicks contractions, or false labor. Toward the end of pregnancy (32-34 weeks), these contractions can develop more often and may become more forceful. This is not true labor because these contractions do not result in opening (dilatation) and thinning of the cervix. They are sometimes difficult to tell apart from true labor because these contractions can be forceful and people have different pain tolerances. You should not feel embarrassed if you go to the hospital with false labor. Sometimes, the only way to tell if you are in true labor is for your health care provider to look for changes in the cervix. °If there are no prenatal problems or other health problems associated with the pregnancy, it is completely safe to be sent home with false labor and await the onset of true labor. °HOW CAN YOU TELL THE DIFFERENCE BETWEEN TRUE AND FALSE LABOR? °False Labor  °· The contractions of false labor are usually shorter and not as hard as those of true labor.   °· The contractions are usually irregular.   °· The contractions are often felt in the front of the lower abdomen and in the groin.   °· The contractions may go away when you walk around or change positions while lying down.   °· The contractions get weaker and are shorter lasting as time goes on.   °· The contractions do not usually become progressively stronger, regular, and closer together as with true labor.   °True Labor  °· Contractions in true labor last 30-70 seconds, become very regular, usually become more intense, and increase in frequency.   °· The contractions do not go away with walking.   °· The discomfort is usually felt in the top of the uterus and spreads to the lower abdomen and low back.   °· True labor can be  determined by your health care provider with an exam. This will show that the cervix is dilating and getting thinner.   °WHAT TO REMEMBER °· Keep up with your usual exercises and follow other instructions given by your health care provider.   °· Take medicines as directed by your health care provider.   °· Keep your regular prenatal appointments.   °· Eat and drink lightly if you think you are going into labor.   °· If Braxton Hicks contractions are making you uncomfortable:   °¨ Change your position from lying down or resting to walking, or from walking to resting.   °¨ Sit and rest in a tub of warm water.   °¨ Drink 2-3 glasses of water. Dehydration may cause these contractions.   °¨ Do slow and deep breathing several times an hour.   °WHEN SHOULD I SEEK IMMEDIATE MEDICAL CARE? °Seek immediate medical care if: °· Your contractions become stronger, more regular, and closer together.   °· You have fluid leaking or gushing from your vagina.   °· You have a fever.   °· You pass blood-tinged mucus.   °· You have vaginal bleeding.   °· You have continuous abdominal pain.   °· You have low back pain that you never had before.   °· You feel your baby's head pushing down and causing pelvic pressure.   °· Your baby is not moving as much as it used to.   °This information is not intended to replace advice given to you by your health care provider. Make sure you discuss any questions you have with your health care   provider. °Document Released: 05/17/2005 Document Revised: 09/08/2015 Document Reviewed: 02/26/2013 °Elsevier Interactive Patient Education © 2017 Elsevier Inc. ° °

## 2016-04-19 NOTE — Progress Notes (Signed)
   PRENATAL VISIT NOTE  Subjective:  Amy Mcclain is a 30 y.o. [id number] at 5350w4d being seen today for ongoing prenatal care.  She is currently monitored for the following issues for this high-risk pregnancy and has Schizophrenia (HCC); Severe recurrent major depression with psychotic features (HCC); Involuntary commitment; Supervision of high risk pregnancy, antepartum; Neonatal death, PTB @ 24wks; Mental disorder affecting pregnancy, antepartum; Language barrier affecting health care; and History of RPR test on her problem list.  Patient reports occasional contractions.  Contractions: Irregular.  .  Movement: Present. Denies leaking of fluid.   The following portions of the patient's history were reviewed and updated as appropriate: allergies, current medications, past family history, past medical history, past social history, past surgical history and problem list. Problem list updated.  Objective:   Vitals:   04/19/16 1306  BP: 123/79  Pulse: (!) 102  Weight: 130 lb 9.6 oz (59.2 kg)    Fetal Status: Fetal Heart Rate (bpm): 145   Movement: Present      Fundal height 39 cm  General:  Alert, oriented and cooperative. Patient is in no acute distress.  Skin: Skin is warm and dry. No rash noted.   Cardiovascular: Normal heart rate noted  Respiratory: Normal respiratory effort, no problems with respiration noted  Abdomen: Soft, gravid, appropriate for gestational age. Pain/Pressure: Present     Pelvic:  Cervical exam deferred      Pt declined  Extremities: Normal range of motion.     Mental Status: Normal mood and affect. Normal behavior. Normal judgment and thought content.   Assessment and Plan:  Pregnancy: [id number] at 6650w4d  1. Supervision of high risk pregnancy, antepartum -return for NST in 3 days  2. Language barrier affecting health care   Term labor symptoms and general obstetric precautions including but not limited to vaginal bleeding, contractions, leaking of fluid and fetal  movement were reviewed in detail with the patient. Please refer to After Visit Summary for other counseling recommendations.  Return in about 3 days (around 04/22/2016) for NST only & Ob/nst in 1 week.   Judeth HornErin Fancy Dunkley, NP

## 2016-04-20 ENCOUNTER — Encounter (HOSPITAL_COMMUNITY): Payer: Self-pay | Admitting: *Deleted

## 2016-04-20 ENCOUNTER — Inpatient Hospital Stay (HOSPITAL_COMMUNITY)
Admission: AD | Admit: 2016-04-20 | Discharge: 2016-04-22 | DRG: 775 | Disposition: A | Discharge status: Home / Self Care | Payer: Medicaid Other | Source: Ambulatory Visit | Attending: Family Medicine | Admitting: Family Medicine

## 2016-04-20 DIAGNOSIS — O0990 Supervision of high risk pregnancy, unspecified, unspecified trimester: Secondary | ICD-10-CM

## 2016-04-20 DIAGNOSIS — O99340 Other mental disorders complicating pregnancy, unspecified trimester: Secondary | ICD-10-CM

## 2016-04-20 DIAGNOSIS — Z3493 Encounter for supervision of normal pregnancy, unspecified, third trimester: Secondary | ICD-10-CM | POA: Diagnosis present

## 2016-04-20 DIAGNOSIS — O99344 Other mental disorders complicating childbirth: Secondary | ICD-10-CM | POA: Diagnosis present

## 2016-04-20 DIAGNOSIS — Z3A39 39 weeks gestation of pregnancy: Secondary | ICD-10-CM

## 2016-04-20 DIAGNOSIS — F329 Major depressive disorder, single episode, unspecified: Secondary | ICD-10-CM | POA: Diagnosis present

## 2016-04-20 LAB — CBC
HCT: 36.7 % (ref 36.0–46.0)
HEMOGLOBIN: 12.2 g/dL (ref 12.0–15.0)
MCH: 30.3 pg (ref 26.0–34.0)
MCHC: 33.2 g/dL (ref 30.0–36.0)
MCV: 91.3 fL (ref 78.0–100.0)
PLATELETS: 239 10*3/uL (ref 150–400)
RBC: 4.02 MIL/uL (ref 3.87–5.11)
RDW: 14.1 % (ref 11.5–15.5)
WBC: 13.9 10*3/uL — AB (ref 4.0–10.5)

## 2016-04-20 LAB — TYPE AND SCREEN
ABO/RH(D): A POS
Antibody Screen: NEGATIVE

## 2016-04-20 MED ORDER — ONDANSETRON HCL 4 MG PO TABS
4.0000 mg | ORAL_TABLET | ORAL | Status: DC | PRN
Start: 2016-04-20 — End: 2016-04-22

## 2016-04-20 MED ORDER — OXYCODONE-ACETAMINOPHEN 5-325 MG PO TABS: 1.0000 | ORAL_TABLET | ORAL | Status: DC | PRN

## 2016-04-20 MED ORDER — WITCH HAZEL-GLYCERIN EX PADS: 1.0000 "application " | MEDICATED_PAD | CUTANEOUS | Status: DC | PRN

## 2016-04-20 MED ORDER — SOD CITRATE-CITRIC ACID 500-334 MG/5ML PO SOLN: 30.0000 mL | ORAL | Status: DC | PRN

## 2016-04-20 MED ORDER — OXYCODONE-ACETAMINOPHEN 5-325 MG PO TABS: 2.0000 | ORAL_TABLET | ORAL | Status: DC | PRN

## 2016-04-20 MED ORDER — DIBUCAINE 1 % RE OINT: 1.0000 "application " | TOPICAL_OINTMENT | RECTAL | Status: DC | PRN

## 2016-04-20 MED ORDER — LIDOCAINE HCL (PF) 1 % IJ SOLN
30.0000 mL | INTRAMUSCULAR | Status: AC | PRN
  Administered 2016-04-20: 30 mL via SUBCUTANEOUS
  Filled 2016-04-20: qty 30

## 2016-04-20 MED ORDER — ACETAMINOPHEN 325 MG PO TABS: 650.0000 mg | ORAL_TABLET | ORAL | Status: DC | PRN

## 2016-04-20 MED ORDER — LACTATED RINGERS IV SOLN: 500.0000 mL | INTRAVENOUS | Status: DC | PRN

## 2016-04-20 MED ORDER — IBUPROFEN 600 MG PO TABS
600.0000 mg | ORAL_TABLET | Freq: Four times a day (QID) | ORAL | Status: DC
  Administered 2016-04-20 – 2016-04-22 (×9): 600 mg via ORAL
  Filled 2016-04-20 (×9): qty 1

## 2016-04-20 MED ORDER — SIMETHICONE 80 MG PO CHEW: 80.0000 mg | CHEWABLE_TABLET | ORAL | Status: DC | PRN

## 2016-04-20 MED ORDER — PRENATAL MULTIVITAMIN CH
1.0000 | ORAL_TABLET | Freq: Every day | ORAL | Status: DC
  Administered 2016-04-20 – 2016-04-22 (×3): 1 via ORAL
  Filled 2016-04-20 (×4): qty 1

## 2016-04-20 MED ORDER — FLEET ENEMA 7-19 GM/118ML RE ENEM: 1.0000 | ENEMA | RECTAL | Status: DC | PRN

## 2016-04-20 MED ORDER — DIPHENHYDRAMINE HCL 25 MG PO CAPS: 25.0000 mg | ORAL_CAPSULE | Freq: Four times a day (QID) | ORAL | Status: DC | PRN

## 2016-04-20 MED ORDER — OXYTOCIN BOLUS FROM INFUSION
500.0000 mL | Freq: Once | INTRAVENOUS | Status: AC
  Administered 2016-04-20: 500 mL via INTRAVENOUS

## 2016-04-20 MED ORDER — SENNOSIDES-DOCUSATE SODIUM 8.6-50 MG PO TABS
2.0000 | ORAL_TABLET | ORAL | Status: DC
  Administered 2016-04-20 – 2016-04-21 (×2): 2 via ORAL
  Filled 2016-04-20 (×2): qty 2

## 2016-04-20 MED ORDER — ONDANSETRON HCL 4 MG/2ML IJ SOLN: 4.0000 mg | INTRAMUSCULAR | Status: DC | PRN

## 2016-04-20 MED ORDER — MEASLES, MUMPS & RUBELLA VAC ~~LOC~~ INJ: 0.5000 mL | INJECTION | Freq: Once | SUBCUTANEOUS | Status: DC

## 2016-04-20 MED ORDER — TETANUS-DIPHTH-ACELL PERTUSSIS 5-2.5-18.5 LF-MCG/0.5 IM SUSP: 0.5000 mL | Freq: Once | INTRAMUSCULAR | Status: DC

## 2016-04-20 MED ORDER — ZOLPIDEM TARTRATE 5 MG PO TABS: 5.0000 mg | ORAL_TABLET | Freq: Every evening | ORAL | Status: DC | PRN

## 2016-04-20 MED ORDER — BENZOCAINE-MENTHOL 20-0.5 % EX AERO
1.0000 "application " | INHALATION_SPRAY | CUTANEOUS | Status: DC | PRN
  Administered 2016-04-20: 1 via TOPICAL
  Filled 2016-04-20: qty 56

## 2016-04-20 MED ORDER — ONDANSETRON HCL 4 MG/2ML IJ SOLN: 4.0000 mg | Freq: Four times a day (QID) | INTRAMUSCULAR | Status: DC | PRN

## 2016-04-20 MED ORDER — BENZOCAINE-MENTHOL 20-0.5 % EX AERO: 1.0000 "application " | INHALATION_SPRAY | CUTANEOUS | Status: DC | PRN

## 2016-04-20 MED ORDER — SENNOSIDES-DOCUSATE SODIUM 8.6-50 MG PO TABS: 2.0000 | ORAL_TABLET | ORAL | Status: DC

## 2016-04-20 MED ORDER — OXYTOCIN 40 UNITS IN LACTATED RINGERS INFUSION - SIMPLE MED
2.5000 [IU]/h | INTRAVENOUS | Status: DC
Start: 2016-04-20 — End: 2016-04-20
  Filled 2016-04-20: qty 1000

## 2016-04-20 MED ORDER — IBUPROFEN 600 MG PO TABS
600.0000 mg | ORAL_TABLET | Freq: Four times a day (QID) | ORAL | Status: DC
  Administered 2016-04-20: 600 mg via ORAL
  Filled 2016-04-20: qty 1

## 2016-04-20 MED ORDER — PRENATAL MULTIVITAMIN CH: 1.0000 | ORAL_TABLET | Freq: Every day | ORAL | Status: DC

## 2016-04-20 MED ORDER — ACETAMINOPHEN 325 MG PO TABS
650.0000 mg | ORAL_TABLET | ORAL | Status: DC | PRN
  Administered 2016-04-20 (×2): 650 mg via ORAL
  Filled 2016-04-20 (×2): qty 2

## 2016-04-20 MED ORDER — ONDANSETRON HCL 4 MG PO TABS: 4.0000 mg | ORAL_TABLET | ORAL | Status: DC | PRN

## 2016-04-20 MED ORDER — COCONUT OIL OIL: 1.0000 "application " | TOPICAL_OIL | Status: DC | PRN

## 2016-04-20 MED ORDER — LACTATED RINGERS IV SOLN: INTRAVENOUS | Status: DC

## 2016-04-20 NOTE — MAU Note (Signed)
C/o ucs since midnight last night; 

## 2016-04-20 NOTE — Lactation Note (Signed)
This note was copied from a baby's chart. Lactation Consultation Note  Patient Name: Amy Mcclain XBJYN'WToday's Date: 04/20/2016 Reason for consult: Initial assessment IPad interpreter used. Baby at 8 hr of life. Upon entry baby in supine position on pillows. Mom leaning over baby squeezing the tip of her nipple in the baby's mouth. Baby will eagerly latch. Mom reported that baby was "sucking hard". Showed mom multiple times how to pull baby close and compress the breast. Mom continues to allow a shallow latch. Discussed baby behavior, feeding frequency, baby belly size, voids, wt loss, breast changes, and nipple care. Demonstrated manual expression, colostrum noted bilaterally, spoon in room. Given lactation handouts in English and referred her to the LLL.org because of the multiple languages available on that web site. Aware of OP services and support group.     Maternal Data Has patient been taught Hand Expression?: Yes Does the patient have breastfeeding experience prior to this delivery?: Yes  Feeding Feeding Type: Breast Fed  LATCH Score/Interventions Latch: Grasps breast easily, tongue down, lips flanged, rhythmical sucking. Intervention(s): Adjust position;Assist with latch  Audible Swallowing: A few with stimulation Intervention(s): Skin to skin  Type of Nipple: Everted at rest and after stimulation  Comfort (Breast/Nipple): Soft / non-tender     Hold (Positioning): Assistance needed to correctly position infant at breast and maintain latch. Intervention(s): Position options  LATCH Score: 8  Lactation Tools Discussed/Used     Consult Status Consult Status: Follow-up Date: 04/21/16 Follow-up type: In-patient    Amy Mcclain 04/20/2016, 4:31 PM

## 2016-04-20 NOTE — H&P (Signed)
Amy JarvisHedaya Karie Mcclain is a 30 y.o. female presenting for SOL. Admitted to L&D with advanced cervical dilation. Contractions started at midnight. OB History    Gravida Para Term Preterm AB Living   4 3 2 1  0 2   SAB TAB Ectopic Multiple Live Births   0 0 0 0 3     Past Medical History:  Diagnosis Date  . Anemia   . Depression   . Schizophrenia [facility](HCC)    Past Surgical History:  Procedure Laterality Date  . NO PAST SURGERIES     Family History: family history is not on file. Social History:  reports that she has never smoked. She has never used smokeless tobacco. She reports that she does not drink alcohol or use drugs.     Maternal Diabetes: No Genetic Screening: Normal Maternal Ultrasounds/Referrals: Normal Fetal Ultrasounds or other Referrals:  None Maternal Substance Abuse:  No Significant Maternal Medications:  None Significant Maternal Lab Results:  None Other Comments:  None  ROS Maternal Medical History:  Reason for admission: Contractions.   Fetal activity: Perceived fetal activity is normal.      Dilation: Lip/rim Effacement (%): 100 Station: +1 Exam by:: Dr. Adrian BlackwaterStinson Last menstrual period 07/17/2015. Maternal Exam:  Uterine Assessment: Contraction strength is moderate.  Contraction duration is 1 minute. Contraction frequency is regular.   Abdomen: Patient reports no abdominal tenderness. Surgical scars: low transverse.   Fundal height is full term.   Fetal presentation: vertex  Introitus: Amniotic fluid character: clear.     Physical Exam  Constitutional: She appears well-developed and well-nourished.  Cardiovascular: Normal rate.   Respiratory: Effort normal.  GI: Soft. She exhibits no distension.  Skin: Skin is warm and dry.  Psychiatric: She has a normal mood and affect. Her behavior is normal. Judgment and thought content normal.    Prenatal labs: ABO, Rh: A/POS/-- (05/30 0001) Antibody: NEG (05/30 0001) Rubella: 9.51 (05/30 0001) RPR: REACTIVE  (08/22 0932)  HBsAg: NEGATIVE (05/30 0001)  HIV: NONREACTIVE (08/22 0932)  GBS: Negative (10/27 0000)   Assessment/Plan: Admit Expectant management.   STINSON, JACOB JEHIEL 04/20/2016, 7:43 AM

## 2016-04-21 ENCOUNTER — Other Ambulatory Visit: Payer: Self-pay

## 2016-04-21 LAB — CBC
HEMATOCRIT: 32.5 % — AB (ref 36.0–46.0)
HEMOGLOBIN: 11 g/dL — AB (ref 12.0–15.0)
MCH: 30.6 pg (ref 26.0–34.0)
MCHC: 33.8 g/dL (ref 30.0–36.0)
MCV: 90.3 fL (ref 78.0–100.0)
Platelets: 208 10*3/uL (ref 150–400)
RBC: 3.6 MIL/uL — AB (ref 3.87–5.11)
RDW: 14 % (ref 11.5–15.5)
WBC: 14.6 10*3/uL — ABNORMAL HIGH (ref 4.0–10.5)

## 2016-04-21 LAB — RPR: RPR: REACTIVE — AB

## 2016-04-21 LAB — RPR, QUANT+TP ABS (REFLEX)
Rapid Plasma Reagin, Quant: 1:2 {titer} — ABNORMAL HIGH
TREPONEMA PALLIDUM AB: NEGATIVE

## 2016-04-21 NOTE — Clinical Social Work Maternal (Signed)
  CLINICAL SOCIAL WORK MATERNAL/CHILD NOTE  Patient Details  Name: Amy Mcclain MRN: [id number] Date of Birth: 11-13-1985  Date:  04/21/2016  Clinical Social Worker Initiating Note:  Laurey Arrow Date/ Time Initiated:  04/21/16/0948     Child's Name:  Arlington Calix   Legal Guardian:  Mother   Need for Interpreter:  Arabic   Date of Referral:  04/21/16     Reason for Referral:  Behavioral Health Issues, including SI    Referral Source:  Central Nursery   Address:  [address] [place] [address]  Phone number:  [phone]   Household Members:  Spouse, Minor Children   Natural Supports (not living in the home):  Spouse/significant other, Neighbors, Friends   Chiropodist: None (Referral made to [employer])   Employment: Unemployed   Type of Work:     Education:  Database administrator Resources:  [employer]   Other Resources:  Physicist, medical , [place] Considerations Which May Impact Care:  None Reported  Strengths:  Ability to meet basic needs , Engineer, materials , Home prepared for child , Understanding of illness   Risk Factors/Current Problems:  Mental Health Concerns    Cognitive State:  Alert , Able to Concentrate , Linear Thinking , Insightful    Mood/Affect:  Bright , Happy , Interested , Comfortable    CSW Assessment: CSW met with MOB utilizing skype interpreting services to assist with language barrier. MOB was polite, inviting, and interested in meeting with CSW. MOB gave CSW permission to meet with MOB while FOB/Husband [facility] [place]) was present. During the assessment, MOB was appropriate with infant and responded appropriately to infant's cues.  CSW inquired about MOB's MH hx and MOB and FOB were forthcoming.  MOB communicated that MOB is not schizophrenic, but does have a dx of depression.  FOB reported MOB was prescribed medication after hospitalization in March 2017, but discontinued all  medications after pregnancy confirmation.  MOB communicated that MOB has felt "normal" and has had no symptoms or signs of depression; FOB agreed with MOB.  MOB denied have any additional MH follow-up appointments.  CSW offered MOB resources for outpatient counseling; MOB declined. FOB agreed that at this time MOB is not in need of counseling.  CSW educated MOB and FOB about PPD. CSW informed MOB of possible supports and interventions to decrease PPD.  CSW also encouraged MOB to seek medical attention if needed for increased signs and symptoms for PPD. MOB and FOB asked appropriate questions and MOB denied having any PPD signs and symptoms with MOB's older children (ages 39 and 61). CSW reviewed safe sleep and SIDS. MOB asked appropriate questions and appeared to be knowledgeable.  MOB communicated that she has a crib for the baby, and feels prepared for the infant. FOB had questions about bathing and caring for the baby.  CSW offered MOB in-home parenting resources [facility]) and both parents appeared eager to participate.  CSW completed referral for [facility], and [facility] will meet with parents after d/c.  MOB did not have any further questions, concerns, or needs at this time. CSW Plan/Description:  Information/Referral to [employer] , [employer] , No Further Intervention Required/No Barriers to Discharge   Laurey Arrow, MSW, LCSW Clinical Social Work [phone]    Dimple Nanas, LCSW 04/21/2016, 9:50 AM

## 2016-04-21 NOTE — Progress Notes (Signed)
UR chart review completed.  

## 2016-04-21 NOTE — Progress Notes (Addendum)
Post Partum Day 1 Subjective: no complaints, up ad lib, voiding and tolerating PO.    Arabic interpreter used on language line, some difficulty with understanding interpreter but pt confirmed she understood most of communication in Arabic and was given opportunity to ask questions.  Objective: Blood pressure 121/82, pulse 76, temperature 98.3 F (36.8 C), temperature source Oral, resp. rate 18, height 5\' 1"  (1.549 m), weight 130 lb (59 kg), last menstrual period 07/17/2015, SpO2 100 %, unknown if currently breastfeeding.  Physical Exam:  General: alert, cooperative and no distress Lochia: appropriate Uterine Fundus: firm Incision: n/a DVT Evaluation: No evidence of DVT seen on physical exam. Negative Homan's sign. No cords or calf tenderness. No significant calf/ankle edema.   Recent Labs  04/20/16 0737 04/21/16 0516  HGB 12.2 11.0*  HCT 36.7 32.5*    Assessment/Plan: Plan for discharge tomorrow, Breastfeeding, Social Work consult and Contraception undecided, discussed LARCs as most effective   LOS: 1 day   LEFTWICH-KIRBY, Kacy Hegna 04/21/2016, 10:22 PM

## 2016-04-22 MED ORDER — IBUPROFEN 600 MG PO TABS: 600.0000 mg | ORAL_TABLET | Freq: Four times a day (QID) | ORAL | 0 refills | Status: DC

## 2016-04-22 MED ORDER — DIPHENHYDRAMINE HCL 25 MG PO CAPS: 25.0000 mg | ORAL_CAPSULE | Freq: Four times a day (QID) | ORAL | 0 refills | Status: DC | PRN

## 2016-04-22 MED ORDER — ACETAMINOPHEN 325 MG PO TABS: 650.0000 mg | ORAL_TABLET | ORAL | 1 refills | Status: DC | PRN

## 2016-04-22 MED ORDER — SENNOSIDES-DOCUSATE SODIUM 8.6-50 MG PO TABS: 2.0000 | ORAL_TABLET | ORAL | 1 refills | Status: DC

## 2016-04-22 NOTE — Progress Notes (Signed)
Discharge instructions reviewed with mother using interpreter [phone]. Number to birth registrar given to family, spelling on birth certificate not correct. Instructed family to call in am.

## 2016-04-22 NOTE — Discharge Summary (Signed)
OB Discharge Summary     Patient Name: Amy Mcclain DOB: [birth year] MRN: [id number]  Date of admission: 04/20/2016 Delivering MD: Levie HeritageSTINSON,  Grosser J   Date of discharge: 04/22/2016  Admitting diagnosis: 40 wks labor Intrauterine pregnancy: 6857w5d     Secondary diagnosis:  Active Problems:   Normal labor   Vaginal delivery  Additional problems: MDD currently not on medication, Questionable Schizophrenia hx     Discharge diagnosis: Term Pregnancy Delivered                                                                                                Post partum procedures:none  Augmentation: AROM  Complications: None  Hospital course:  Onset of Labor With Vaginal Delivery     30 y.o. yo [phone] at 2057w5d was admitted in Active Labor on 04/20/2016. Patient had an uncomplicated labor course as follows:  Membrane Rupture Time/Date: 7:40 AM ,04/20/2016   Intrapartum Procedures: Episiotomy: None [1]                                         Lacerations:  1st degree [2]  Patient had a delivery of a Viable infant. 04/20/2016  Information for the patient's newborn:  Particia Jasperli, Girl Leen [[id number]][[id number]]  Delivery Method: Vaginal, Spontaneous Delivery (Filed from Delivery Summary)    Pateint had an uncomplicated postpartum course.  She is ambulating, tolerating a regular diet, passing flatus, and urinating well. Patient is discharged home in stable condition on 04/22/16.    Physical exam Vitals:   04/20/16 1903 04/21/16 0553 04/21/16 1820 04/22/16 0526  BP: 102/65 101/65 121/82 119/76  Pulse: 73 78 76 70  Resp: 18 18 18 18   Temp:  97.9 F (36.6 C) 98.3 F (36.8 C) 98.2 F (36.8 C)  TempSrc:  Oral Oral Oral  SpO2:      Weight:      Height:       General: alert, cooperative and no distress Lochia: appropriate Uterine Fundus: firm Incision: N/A DVT Evaluation: No evidence of DVT seen on physical exam. Negative Homan's sign. No cords or calf tenderness. Labs: Lab Results  Component  Value Date   WBC 14.6 (H) 04/21/2016   HGB 11.0 (L) 04/21/2016   HCT 32.5 (L) 04/21/2016   MCV 90.3 04/21/2016   PLT 208 04/21/2016   CMP Latest Ref Rng & Units 08/18/2015  Glucose 65 - 99 mg/dL 696(E111(H)  BUN 6 - 20 mg/dL 12  Creatinine 9.520.44 - 8.411.00 mg/dL 3.240.54  Sodium 401135 - 027145 mmol/L 138  Potassium 3.5 - 5.1 mmol/L 4.3  Chloride 101 - 111 mmol/L 107  CO2 22 - 32 mmol/L 22  Calcium 8.9 - 10.3 mg/dL 9.1  Total Protein 6.5 - 8.1 g/dL 7.3  Total Bilirubin 0.3 - 1.2 mg/dL 0.4  Alkaline Phos 38 - 126 U/L 48  AST 15 - 41 U/L 19  ALT 14 - 54 U/L 18    Discharge instruction: per After Visit Summary and "Baby and Me Booklet".  After  visit meds:   Diet: routine diet  Activity: Advance as tolerated. Pelvic rest for 6 weeks.   Outpatient follow up:1 week Follow up Appt:Future Appointments Date Time Provider Department Center  05/26/2016 10:00 AM Dorathy KinsmanVirginia Smith, CNM [facility]   Follow up Visit:No Follow-up on file.  Postpartum contraception: None  Newborn Data: Live born female  Birth Weight: 7 lb 11.5 oz (3500 g) APGAR: 9, 9  Baby Feeding: Breast Disposition:home with mother   04/22/2016 Deforest HoylesWALLACE, NOAH I, DO  Attestation of Attending Supervision of Resident: Evaluation and management procedures were performed by the Resident under my supervision and collaboration.  I have seen and examined the patient.  I agree with the Resident's note and Assessment and Plan.  Candelaria CelesteJacob Susano Cleckler, DO Attending Physician Faculty Practice, [facility]

## 2016-04-22 NOTE — Lactation Note (Signed)
This note was copied from a baby's chart. Lactation Consultation Note Mom BF when LC entered rm. In cross cradle position. Poor body alignment. Baby wasn't facing mom, body twisted. Swaddled in blanket. Assisted in repositioning baby towards mom STS. Called international interpreter w/[employer]. 1st interpreter [phone], done a lot of teaching got disconnected. 2nd interpreter [phone] didn't understand pt. Dialect. 3rd interpreter [phone] communicated well w/pt.  Mom's breast are filling, knots noted. Encouraged and demonstrated breast massage. Discussed body alignment, STS, hearing swallows. Baby gulping at breast. Mom told interpreter she didn't think the baby was getting satisfied at the breast and may need formula. Reviewed breast filling, supplementing, getting engorged.  Mom wanted to know how long should she feed the baby. Explained different amounts, let the baby tell her, but at least 15-20 min. Discussed feeding cues, cluster feeding, satisfaction, supply and demand. Gave mom breast pump to relived breast d/t filling to prevent engorgement. Mom didn't want to use breast pump at this time. At bedside if needed. How to use pump and clean pump explained w/interpreter.  This is mom's 3rd baby and feels that she is doing good.  Mom has round breast w/everted nipples. Encouraged cheeks to breast. Mom frequently pulls breast back to prevent baby from smuthering. Discussed safe positions, mom cont. To pull back breast, discussed unlatching or causing not to have a deep latch which causes pain. Mom still going to do it her way d/t that's what she has done w/her other 2 children.  Encouraged to call for assistance or questions. Patient Name: Amy Mcclain Reason for consult: Follow-up assessment   Maternal Data    Feeding Feeding Type: Breast Fed Length of feed: 20 min  LATCH Score/Interventions Latch: Grasps breast easily, tongue down, lips flanged, rhythmical  sucking. Intervention(s): Adjust position;Assist with latch;Breast massage;Breast compression  Audible Swallowing: Spontaneous and intermittent Intervention(s): Skin to skin;Hand expression  Type of Nipple: Everted at rest and after stimulation  Comfort (Breast/Nipple): Soft / non-tender     Hold (Positioning): Assistance needed to correctly position infant at breast and maintain latch. Intervention(s): Breastfeeding basics reviewed;Support Pillows;Position options;Skin to skin  LATCH Score: 9  Lactation Tools Discussed/Used Pump Review: Setup, frequency, and cleaning;Milk Storage Initiated by:: Peri JeffersonL. Nilo Fallin RN IBCLC Date initiated:: 04/22/16   Consult Status Consult Status: Follow-up Date: 04/22/16 (in pm) Follow-up type: In-patient    Amy Mcclain, Amy Mcclain Mcclain, 2:19 AM

## 2016-04-22 NOTE — Discharge Instructions (Signed)

## 2016-04-28 ENCOUNTER — Telehealth: Payer: Self-pay | Admitting: Clinical

## 2016-04-28 NOTE — Telephone Encounter (Signed)
Via [place] Arabic interpreter, pt will come in to appointment to talk to [facility] on Monday, December 4 at 1pm.

## 2016-05-03 ENCOUNTER — Ambulatory Visit (INDEPENDENT_AMBULATORY_CARE_PROVIDER_SITE_OTHER): Payer: Medicaid Other | Admitting: Clinical

## 2016-05-03 DIAGNOSIS — Z630 Problems in relationship with spouse or partner: Secondary | ICD-10-CM | POA: Diagnosis present

## 2016-05-03 NOTE — BH Specialist Note (Addendum)
Session Start time: 1:15   End Time: 2:10 Total Time:  55 minutes Type of Service: Behavioral Health - Individual/Family Interpreter: Yes.     Interpreter Name & Language: [place] Arabic, in-person # [facility] July 2017-June 2018: 1st   SUBJECTIVE: Amy JarvisHedaya Karie Mainlandli is a 30 y.o. female  Pt. was referred by Dr Amy BlackwaterStinson for:  Mood check. Pt. reports the following symptoms/concerns: Pt states that she is not having any mood issues, that she has felt "normal" since giving birth, "no baby blues", no excessive worry, no disturbing thoughts, no concern postpartum.  Pt says she has many friends and family who have been supportive and help her "except for my husband, he does'nt help".Pt's husband says that she was [place] antidepressants "[place]", and wants to know if wife can supplement with a bottle. Pt says she plans to breastfeed exclusively for six months, and will not take any BH meds home because her husband may "force me to take them, and I won't". [facility] other [facility]. Severity: non-existent Previous treatment[place]   INTERVENTIONS: Strength-based, Supportive and Family Systems   ASSESSMENT:  Pt currently experiencing Marital conflict. Pt may benefit from brief therapeutic intervention regarding coping with symptoms of marital conflict.   PLAN: 1. F/U with behavioral health clinician: As needed 2. Behavioral Health meds: none 3. Behavioral recommendations:  4. Referral: Brief Counseling/Psychotherapy  Amy LipsJamie C Mcclain LCSWA Behavioral Health Clinician  OBJECTIVE: Mood: Appropriate & Affect: Appropriate Risk of harm to self or others: Low risk of harm to self or others (previous SI in 07-2015), no SI today or in recent past since 07-2015(almost 9 months previous) Assessments administered: PHQ9: 1  LIFE CONTEXT:  Family & Social: Lives with husband and two children(7yo son, 10yo daughter); other family and friends  local, talks to distant family weekly School/ Work: Husband supports financially  Self-Care: Sleeping when baby sleeps; sleeping and eating well, no substances Life changes: Recent childbirth (2 weeks prior) What is important to pt/family (values): Children, family   GOALS ADDRESSED:  -Maintain reduction in mood symptoms  INTERVENTIONS: Strength-based, Supportive and Family Systems   Depression screen [facility] 2/9 05/03/2016 04/12/2016 03/31/2016 02/04/2016 12/18/2015  Decreased Interest 0 0 0 2 0  Down, Depressed, Hopeless 0 0 - 1 0  PHQ - 2 Score 0 0 0 3 0  Altered sleeping 0 0 - 1 0  Tired, decreased energy 1 0 1 2 1   Change in appetite 0 0 0 0 0  Feeling bad or failure about yourself  0 0 0 0 0  Trouble concentrating 0 0 1 0 0  Moving slowly or fidgety/restless 0 0 1 0 0  Suicidal thoughts 0 0 0 0 0  PHQ-9 Score 1 0 - 6 1   GAD 7 : Generalized Anxiety Score 02/04/2016  Nervous, Anxious, on Edge 0  Control/stop worrying 0  Worry too much - different things 0  Trouble relaxing 0  Restless 0  Easily annoyed or irritable 0  Afraid - awful might happen 0  Total GAD 7 Score 0

## 2016-05-26 ENCOUNTER — Ambulatory Visit: Payer: Self-pay | Admitting: Advanced Practice Midwife

## 2016-05-28 ENCOUNTER — Ambulatory Visit: Payer: Self-pay | Admitting: Advanced Practice Midwife

## 2016-06-07 ENCOUNTER — Encounter: Payer: Self-pay | Admitting: Advanced Practice Midwife

## 2016-06-07 ENCOUNTER — Ambulatory Visit (INDEPENDENT_AMBULATORY_CARE_PROVIDER_SITE_OTHER): Payer: Medicaid Other | Admitting: Advanced Practice Midwife

## 2016-06-07 DIAGNOSIS — K5900 Constipation, unspecified: Secondary | ICD-10-CM

## 2016-06-07 DIAGNOSIS — Z8659 Personal history of other mental and behavioral disorders: Secondary | ICD-10-CM

## 2016-06-07 MED ORDER — POLYETHYLENE GLYCOL 3350 17 GM/SCOOP PO POWD: 17.0000 g | Freq: Two times a day (BID) | ORAL | 1 refills | Status: DC | PRN

## 2016-06-07 NOTE — Progress Notes (Signed)
Language Resources Amy Mcclain

## 2016-06-07 NOTE — Progress Notes (Signed)
Subjective:     Amy JarvisHedaya Karie Mcclain is a 31 y.o. female who presents for a postpartum visit. She is 7 weeks postpartum following a spontaneous vaginal delivery. I have fully reviewed the prenatal and intrapartum course. The delivery was at 39.5 gestational weeks. Outcome: spontaneous vaginal delivery. Anesthesia: none. Postpartum course has been complicated by constipation. Baby's course has been uncomplicated. Baby is feeding by breast. Bleeding: Lochia ceased a few weeks ago. But thinks she got her period 4 days ago. Had moderate cramping which resolved. Small bleeding. Bowel function: constipation. Hasn't improved with home remedies, but reports a diet primarily rice and meat with few fruits and vegetables . Bladder function is normal. Patient is not sexually active. Contraception method is none. Postpartum depression screening: negative.   Patient has history of schizophrenia versus depression. 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 Had previously been on psychiatric medications [place] Stopped at beginning of pregnancy. Did not follow-up with mental health care provider after psychiatric hospitalization. Was seen by social worker in the hospital after delivery due to psychiatric history. Reportedly had normal mood. Was given resources for mental health care providers, the patient and her husband did not feel that she needed counseling. Not discharged home on any psychiatric medication per patient preference.  Was seen for scheduled follow-up mood check 2 weeks after delivery. Patient denied issues with depression, but did report pressure from her husband to take psychiatric medications which the patient did not feel she needed.  The following portions of the patient's history were reviewed and updated as appropriate: allergies, current medications, past family history, past medical history, past social history, past  surgical history and problem list.  Review of Systems Pertinent items are noted in HPI.   Objective:    BP 120/77   Pulse 97   Wt 114 lb 14.4 oz (52.1 kg)   BMI 21.71 kg/m   General:  alert, cooperative, appears stated age, no distress and Normal affect. Making eye contact. Answers questions appropriately via interpreter.   Breasts:  Declined  Lungs: clear to auscultation bilaterally  Heart:  regular rate and rhythm, S1, S2 normal, no murmur, click, rub or gallop  Abdomen: soft, non-tender; bowel sounds normal; no masses,  no organomegaly   Vulva:  Declined  Rectal Exam: Not performed.        Assessment:    normal postpartum exam. Pap smear not done at today's visit.  History of schizophrenia versus depression. Stable mood. Constipation Plan:    1. Contraception: abstinence for now. Lengthy conversation about contraceptive choices. Patient and husband state they're finished having children. Discussed sterilization and LARC. Patient has pregnancy Medicaid and it is too late tubal ligation due to waiting period for consent. Explained to patient that she may be fertile now since she appears to have resumed menstrual periods. Strongly recommended starting contraception before resuming intercourse to prevent unwanted pregnancy. 2. Patient reports that she has resources for mental health care providers as needed, but does not feel that she needs medication or counseling at this time. Postpartum depression precautions reviewed. 3. Follow up in: 1 Year for annual GYN exam or sooner or as needed for contraception.  4. Recommend the patient increase fluids and fiber to treat constipation. Rx MiraLAX when necessary if dietary changes are not enough.

## 2016-06-07 NOTE — Patient Instructions (Signed)
Contraception Choices Contraception (birth control) is the use of any methods or devices to prevent pregnancy. Below are some methods to help avoid pregnancy. Hormonal methods  Contraceptive implant. This is a thin, plastic tube containing progesterone hormone. It does not contain estrogen hormone. Your health care provider inserts the tube in the inner part of the upper arm. The tube can remain in place for up to 3 years. After 3 years, the implant must be removed. The implant prevents the ovaries from releasing an egg (ovulation), thickens the cervical mucus to prevent sperm from entering the uterus, and thins the lining of the inside of the uterus.  Progesterone-only injections. These injections are given every 3 months by your health care provider to prevent pregnancy. This synthetic progesterone hormone stops the ovaries from releasing eggs. It also thickens cervical mucus and changes the uterine lining. This makes it harder for sperm to survive in the uterus.  Birth control pills. These pills contain estrogen and progesterone hormone. They work by preventing the ovaries from releasing eggs (ovulation). They also cause the cervical mucus to thicken, preventing the sperm from entering the uterus. Birth control pills are prescribed by a health care provider.Birth control pills can also be used to treat heavy periods.  Minipill. This type of birth control pill contains only the progesterone hormone. They are taken every day of each month and must be prescribed by your health care provider.  Birth control patch. The patch contains hormones similar to those in birth control pills. It must be changed once a week and is prescribed by a health care provider.  Vaginal ring. The ring contains hormones similar to those in birth control pills. It is left in the vagina for 3 weeks, removed for 1 week, and then a new one is put back in place. The patient must be comfortable inserting and removing the ring from  the vagina.A health care provider's prescription is necessary.  Emergency contraception. Emergency contraceptives prevent pregnancy after unprotected sexual intercourse. This pill can be taken right after sex or up to 5 days after unprotected sex. It is most effective the sooner you take the pills after having sexual intercourse. Most emergency contraceptive pills are available without a prescription. Check with your pharmacist. Do not use emergency contraception as your only form of birth control. Barrier methods  Female condom. This is a thin sheath (latex or rubber) that is worn over the penis during sexual intercourse. It can be used with spermicide to increase effectiveness.  Female condom. This is a soft, loose-fitting sheath that is put into the vagina before sexual intercourse.  Diaphragm. This is a soft, latex, dome-shaped barrier that must be fitted by a health care provider. It is inserted into the vagina, along with a spermicidal jelly. It is inserted before intercourse. The diaphragm should be left in the vagina for 6 to 8 hours after intercourse.  Cervical cap. This is a round, soft, latex or plastic cup that fits over the cervix and must be fitted by a health care provider. The cap can be left in place for up to 48 hours after intercourse.  Sponge. This is a soft, circular piece of polyurethane foam. The sponge has spermicide in it. It is inserted into the vagina after wetting it and before sexual intercourse.  Spermicides. These are chemicals that kill or block sperm from entering the cervix and uterus. They come in the form of creams, jellies, suppositories, foam, or tablets. They do not require a prescription. They   are inserted into the vagina with an applicator before having sexual intercourse. The process must be repeated every time you have sexual intercourse. Intrauterine contraception  Intrauterine device (IUD). This is a T-shaped device that is put in a woman's uterus during  a menstrual period to prevent pregnancy. There are 2 types: ? Copper IUD. This type of IUD is wrapped in copper wire and is placed inside the uterus. Copper makes the uterus and fallopian tubes produce a fluid that kills sperm. It can stay in place for 10 years. ? Hormone IUD. This type of IUD contains the hormone progestin (synthetic progesterone). The hormone thickens the cervical mucus and prevents sperm from entering the uterus, and it also thins the uterine lining to prevent implantation of a fertilized egg. The hormone can weaken or kill the sperm that get into the uterus. It can stay in place for 3-5 years, depending on which type of IUD is used. Permanent methods of contraception  Female tubal ligation. This is when the woman's fallopian tubes are surgically sealed, tied, or blocked to prevent the egg from traveling to the uterus.  Hysteroscopic sterilization. This involves placing a small coil or insert into each fallopian tube. Your doctor uses a technique called hysteroscopy to do the procedure. The device causes scar tissue to form. This results in permanent blockage of the fallopian tubes, so the sperm cannot fertilize the egg. It takes about 3 months after the procedure for the tubes to become blocked. You must use another form of birth control for these 3 months.  Female sterilization. This is when the female has the tubes that carry sperm tied off (vasectomy).This blocks sperm from entering the vagina during sexual intercourse. After the procedure, the man can still ejaculate fluid (semen). Natural planning methods  Natural family planning. This is not having sexual intercourse or using a barrier method (condom, diaphragm, cervical cap) on days the woman could become pregnant.  Calendar method. This is keeping track of the length of each menstrual cycle and identifying when you are fertile.  Ovulation method. This is avoiding sexual intercourse during ovulation.  Symptothermal method.  This is avoiding sexual intercourse during ovulation, using a thermometer and ovulation symptoms.  Post-ovulation method. This is timing sexual intercourse after you have ovulated. Regardless of which type or method of contraception you choose, it is important that you use condoms to protect against the transmission of sexually transmitted infections (STIs). Talk with your health care provider about which form of contraception is most appropriate for you. This information is not intended to replace advice given to you by your health care provider. Make sure you discuss any questions you have with your health care provider. Document Released: 05/17/2005 Document Revised: 10/23/2015 Document Reviewed: 11/09/2012 Elsevier Interactive Patient Education  2017 [employer]  

## 2017-10-11 ENCOUNTER — Emergency Department (HOSPITAL_COMMUNITY)
Admission: EM | Admit: 2017-10-11 | Discharge: 2017-10-14 | Disposition: A | Discharge status: Other Acute Inpatient Hospital | Payer: Medicaid Other | Attending: Emergency Medicine | Admitting: Emergency Medicine

## 2017-10-11 ENCOUNTER — Encounter (HOSPITAL_COMMUNITY): Payer: Self-pay | Admitting: Student

## 2017-10-11 ENCOUNTER — Other Ambulatory Visit: Payer: Self-pay

## 2017-10-11 DIAGNOSIS — F329 Major depressive disorder, single episode, unspecified: Secondary | ICD-10-CM | POA: Insufficient documentation

## 2017-10-11 DIAGNOSIS — Z046 Encounter for general psychiatric examination, requested by authority: Secondary | ICD-10-CM | POA: Diagnosis present

## 2017-10-11 DIAGNOSIS — F209 Schizophrenia, unspecified: Secondary | ICD-10-CM | POA: Diagnosis present

## 2017-10-11 DIAGNOSIS — Z79899 Other long term (current) drug therapy: Secondary | ICD-10-CM | POA: Insufficient documentation

## 2017-10-11 DIAGNOSIS — F203 Undifferentiated schizophrenia: Secondary | ICD-10-CM | POA: Insufficient documentation

## 2017-10-11 DIAGNOSIS — R4689 Other symptoms and signs involving appearance and behavior: Secondary | ICD-10-CM

## 2017-10-11 LAB — CBC
HEMATOCRIT: 33.7 % — AB (ref 36.0–46.0)
HEMOGLOBIN: 10.8 g/dL — AB (ref 12.0–15.0)
MCH: 28 pg (ref 26.0–34.0)
MCHC: 32 g/dL (ref 30.0–36.0)
MCV: 87.3 fL (ref 78.0–100.0)
Platelets: 403 10*3/uL — ABNORMAL HIGH (ref 150–400)
RBC: 3.86 MIL/uL — AB (ref 3.87–5.11)
RDW: 14.2 % (ref 11.5–15.5)
WBC: 19 10*3/uL — AB (ref 4.0–10.5)

## 2017-10-11 LAB — COMPREHENSIVE METABOLIC PANEL
ALBUMIN: 4.5 g/dL (ref 3.5–5.0)
ALK PHOS: 61 U/L (ref 38–126)
ALT: 12 U/L — ABNORMAL LOW (ref 14–54)
AST: 23 U/L (ref 15–41)
Anion gap: 12 (ref 5–15)
BUN: 17 mg/dL (ref 6–20)
CALCIUM: 9.2 mg/dL (ref 8.9–10.3)
CHLORIDE: 109 mmol/L (ref 101–111)
CO2: 22 mmol/L (ref 22–32)
Creatinine, Ser: 0.6 mg/dL (ref 0.44–1.00)
GFR calc Af Amer: >60 mL/min (ref >60)
GFR calc non Af Amer: >60 mL/min (ref >60)
GLUCOSE: 131 mg/dL — AB (ref 65–99)
POTASSIUM: 3.2 mmol/L — AB (ref 3.5–5.1)
SODIUM: 143 mmol/L (ref 135–145)
Total Bilirubin: 0.6 mg/dL (ref 0.3–1.2)
Total Protein: 7.8 g/dL (ref 6.5–8.1)

## 2017-10-11 LAB — I-STAT BETA HCG BLOOD, ED (MC, WL, AP ONLY)

## 2017-10-11 LAB — SALICYLATE LEVEL: Salicylate Lvl: <7 mg/dL (ref 2.8–30.0)

## 2017-10-11 LAB — ETHANOL: Alcohol, Ethyl (B): <10 mg/dL (ref <10)

## 2017-10-11 LAB — ACETAMINOPHEN LEVEL

## 2017-10-11 MED ORDER — LORAZEPAM 2 MG/ML IJ SOLN
1.0000 mg | Freq: Once | INTRAMUSCULAR | Status: AC
  Administered 2017-10-11: 1 mg via INTRAMUSCULAR
  Filled 2017-10-11: qty 1

## 2017-10-11 MED ORDER — HALOPERIDOL LACTATE 5 MG/ML IJ SOLN
2.5000 mg | Freq: Once | INTRAMUSCULAR | Status: AC
  Administered 2017-10-11: 2.5 mg via INTRAMUSCULAR
  Filled 2017-10-11: qty 1

## 2017-10-11 MED ORDER — ZOLPIDEM TARTRATE 5 MG PO TABS: 5.0000 mg | ORAL_TABLET | Freq: Every evening | ORAL | Status: DC | PRN

## 2017-10-11 MED ORDER — ALUM & MAG HYDROXIDE-SIMETH 200-200-20 MG/5ML PO SUSP: 30.0000 mL | Freq: Four times a day (QID) | ORAL | Status: DC | PRN

## 2017-10-11 MED ORDER — ONDANSETRON HCL 4 MG PO TABS: 4.0000 mg | ORAL_TABLET | Freq: Three times a day (TID) | ORAL | Status: DC | PRN

## 2017-10-11 MED ORDER — ACETAMINOPHEN 325 MG PO TABS: 650.0000 mg | ORAL_TABLET | ORAL | Status: DC | PRN

## 2017-10-11 MED ORDER — POTASSIUM CHLORIDE CRYS ER 20 MEQ PO TBCR: 40.0000 meq | EXTENDED_RELEASE_TABLET | Freq: Once | ORAL | Status: DC

## 2017-10-11 NOTE — Progress Notes (Signed)
Attempted to contact pt's husband but he did not answer the phone and his voicemail was full, making it impossible to leave a HIPAA-compliant message.
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
## 2017-10-11 NOTE — ED Notes (Signed)
Bed: [facility] Expected date:  Expected time:  Means of arrival:  Comments: Hold for hall 29

## 2017-10-11 NOTE — ED Notes (Signed)
Reported that patient eats at 8:30 pm and 4:00 am.

## 2017-10-11 NOTE — ED Notes (Signed)
Patient agitated at this time. Patient pulled BP cuff arm while BP taking. Will attempt later.
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
## 2017-10-11 NOTE — ED Notes (Signed)
Report given to RN
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
## 2017-10-12 MED ORDER — OLANZAPINE 10 MG IM SOLR: 5.0000 mg | Freq: Two times a day (BID) | INTRAMUSCULAR | Status: DC | PRN

## 2017-10-12 MED ORDER — OLANZAPINE 5 MG PO TBDP
2.5000 mg | ORAL_TABLET | Freq: Every day | ORAL | Status: DC
Start: 2017-10-12 — End: 2017-10-13
  Filled 2017-10-12: qty 0.5

## 2017-10-12 NOTE — BH Assessment (Signed)
[facility] Assessment Progress Note  Per Juanetta Beets, DO, this pt requires psychiatric hospitalization at this time.  Pt presents under IVC initiated by pt's spouse and upheld by Dr Sharma Covert.  The following facilities have been contacted to seek placement for this pt, with results as noted:  Beds available, information sent, decision pending:  [facility]    Doylene Canning, [place] [employer] Health Coordinator [phone]
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
## 2017-10-12 NOTE — ED Notes (Signed)
Patient given two 8 ounces of bottled water and encouraged to drink.

## 2017-10-12 NOTE — Progress Notes (Signed)
Pt A & O X2 (self and place). Preoccupied about going home to husband and babies "you give me Iphone, I call husband and babies, then go home". Unable to complete full assessment this shift; interpreters on wally don't speak pt's form of Arabic. Fluids encouraged and offered, tolerated well. Pt drank 7 80z bottles of water this shift. Vitals done, WNL. Still continues to refuse medications when offered "no, me no medicines, tired, not good". Pt ate approximately 30 percent of each meals when offered (fruits, salad). Pt pending placement. Continued support and encouragement offered. Safety checks maintained without outburst or self harm gestures thus far.

## 2017-10-13 DIAGNOSIS — R45851 Suicidal ideations: Secondary | ICD-10-CM

## 2017-10-13 DIAGNOSIS — F22 Delusional disorders: Secondary | ICD-10-CM

## 2017-10-13 DIAGNOSIS — F419 Anxiety disorder, unspecified: Secondary | ICD-10-CM

## 2017-10-13 DIAGNOSIS — R45850 Homicidal ideations: Secondary | ICD-10-CM

## 2017-10-13 DIAGNOSIS — Z5320 Procedure and treatment not carried out because of patient's decision for unspecified reasons: Secondary | ICD-10-CM

## 2017-10-13 DIAGNOSIS — R450 Nervousness: Secondary | ICD-10-CM

## 2017-10-13 DIAGNOSIS — F203 Undifferentiated schizophrenia: Secondary | ICD-10-CM

## 2017-10-13 DIAGNOSIS — F29 Unspecified psychosis not due to a substance or known physiological condition: Secondary | ICD-10-CM

## 2017-10-13 LAB — URINALYSIS, ROUTINE W REFLEX MICROSCOPIC
BILIRUBIN URINE: NEGATIVE
BILIRUBIN URINE: NEGATIVE
GLUCOSE, UA: NEGATIVE mg/dL
Glucose, UA: NEGATIVE mg/dL
Hgb urine dipstick: NEGATIVE
KETONES UR: NEGATIVE mg/dL
Ketones, ur: NEGATIVE mg/dL
LEUKOCYTES UA: NEGATIVE
NITRITE: NEGATIVE
Nitrite: NEGATIVE
PH: 7 (ref 5.0–8.0)
PH: 6 (ref 5.0–8.0)
PROTEIN: NEGATIVE mg/dL
Protein, ur: NEGATIVE mg/dL
SPECIFIC GRAVITY, URINE: 1.006 (ref 1.005–1.030)
Specific Gravity, Urine: 1.012 (ref 1.005–1.030)

## 2017-10-13 LAB — RAPID URINE DRUG SCREEN, HOSP PERFORMED
Amphetamines: NOT DETECTED
BARBITURATES: NOT DETECTED
Benzodiazepines: NOT DETECTED
COCAINE: NOT DETECTED
OPIATES: NOT DETECTED
TETRAHYDROCANNABINOL: NOT DETECTED

## 2017-10-13 LAB — URINALYSIS, COMPLETE (UACMP) WITH MICROSCOPIC
BACTERIA UA: NONE SEEN
Bilirubin Urine: NEGATIVE
Glucose, UA: NEGATIVE mg/dL
Hgb urine dipstick: NEGATIVE
Ketones, ur: NEGATIVE mg/dL
LEUKOCYTES UA: NEGATIVE
Nitrite: NEGATIVE
PH: 6 (ref 5.0–8.0)
Protein, ur: NEGATIVE mg/dL
SPECIFIC GRAVITY, URINE: 1.013 (ref 1.005–1.030)

## 2017-10-13 MED ORDER — OLANZAPINE 5 MG PO TABS
5.0000 mg | ORAL_TABLET | Freq: Two times a day (BID) | ORAL | Status: DC
  Administered 2017-10-14: 5 mg via ORAL
  Filled 2017-10-13 (×2): qty 1

## 2017-10-13 MED ORDER — SULFAMETHOXAZOLE-TRIMETHOPRIM 800-160 MG PO TABS
1.0000 | ORAL_TABLET | Freq: Two times a day (BID) | ORAL | Status: DC
  Filled 2017-10-13: qty 1

## 2017-10-13 MED ORDER — STERILE WATER FOR INJECTION IJ SOLN
INTRAMUSCULAR | Status: AC
  Administered 2017-10-13: 1.2 mL
  Filled 2017-10-13: qty 10

## 2017-10-13 MED ORDER — OLANZAPINE 10 MG IM SOLR
5.0000 mg | Freq: Two times a day (BID) | INTRAMUSCULAR | Status: DC
  Administered 2017-10-13 (×2): 5 mg via INTRAMUSCULAR
  Filled 2017-10-13 (×2): qty 10
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
## 2017-10-13 NOTE — ED Notes (Signed)
Patient restless and pacing in her room at this time. Pt with no physical distress noted. Patient smiling on approach, but appears anxious. Pt declines any medication at this time.
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
## 2017-10-13 NOTE — ED Notes (Signed)
Urine sample obtained and sent to lab.

## 2017-10-13 NOTE — ED Notes (Signed)
Patient calm and cooperative.  Attempts to give staff a urine sample, but continues to put toilet water into the cup.

## 2017-10-13 NOTE — ED Notes (Signed)
Patient refused po medication. Security called to unit to Doctor, hospital with IM administration. Patient cooperative during injection and with no signs of distress noted at this time.

## 2017-10-13 NOTE — BH Assessment (Signed)
[facility] Assessment Progress Note   Per Juanetta Beets, DO, this pt continues to require psychiatric hospitalization at this time.  Pt presents under IVC initiated by pt's spouse and upheld by Dr Sharma Covert.  The following facilities have been contacted to seek placement for this pt, with results as noted:   Beds available, information sent, decision pending:  [facility] (referred on 10/12/2017; decision pending) [facility]   Declined:  [facility] (due to unit acuity)   At capacity:  [facility], [place] [facility] Coordinator [phone]

## 2017-10-13 NOTE — ED Notes (Signed)
Patient has refused medications but has been calm. Patient has not shown any signs of aggression or agitation during the night shift.  Encouragement and support provided and safety maintain. Q 15 min safety checks remain in place and video monitoring.

## 2017-10-13 NOTE — BHH Counselor (Signed)
Call from Turner, RN at [facility], reporting both [facility] & [facility] will not accept pt for inpt admission due to being at capacity.

## 2017-10-13 NOTE — ED Notes (Signed)
Patient refusing PO medication.  MD notified.

## 2017-10-13 NOTE — ED Notes (Signed)
Patient cathed and urine sent to lab.

## 2017-10-14 ENCOUNTER — Encounter (HOSPITAL_COMMUNITY): Payer: Self-pay

## 2017-10-14 ENCOUNTER — Inpatient Hospital Stay (HOSPITAL_COMMUNITY)
Admission: AD | Admit: 2017-10-14 | Discharge: 2017-10-21 | DRG: 885 | Disposition: A | Discharge status: Home / Self Care | Payer: No Typology Code available for payment source | Source: Intra-hospital | Attending: Psychiatry | Admitting: Psychiatry

## 2017-10-14 ENCOUNTER — Other Ambulatory Visit: Payer: Self-pay

## 2017-10-14 DIAGNOSIS — F333 Major depressive disorder, recurrent, severe with psychotic symptoms: Secondary | ICD-10-CM | POA: Diagnosis present

## 2017-10-14 DIAGNOSIS — Z9114 Patient's other noncompliance with medication regimen: Secondary | ICD-10-CM

## 2017-10-14 DIAGNOSIS — G4700 Insomnia, unspecified: Secondary | ICD-10-CM | POA: Diagnosis present

## 2017-10-14 DIAGNOSIS — R442 Other hallucinations: Secondary | ICD-10-CM | POA: Diagnosis present

## 2017-10-14 DIAGNOSIS — Z79899 Other long term (current) drug therapy: Secondary | ICD-10-CM | POA: Diagnosis not present

## 2017-10-14 DIAGNOSIS — F203 Undifferentiated schizophrenia: Secondary | ICD-10-CM | POA: Diagnosis not present

## 2017-10-14 DIAGNOSIS — R4587 Impulsiveness: Secondary | ICD-10-CM

## 2017-10-14 DIAGNOSIS — N390 Urinary tract infection, site not specified: Secondary | ICD-10-CM | POA: Diagnosis not present

## 2017-10-14 DIAGNOSIS — F209 Schizophrenia, unspecified: Secondary | ICD-10-CM | POA: Diagnosis not present

## 2017-10-14 DIAGNOSIS — R451 Restlessness and agitation: Secondary | ICD-10-CM | POA: Diagnosis not present

## 2017-10-14 DIAGNOSIS — F419 Anxiety disorder, unspecified: Secondary | ICD-10-CM | POA: Diagnosis present

## 2017-10-14 DIAGNOSIS — F329 Major depressive disorder, single episode, unspecified: Secondary | ICD-10-CM | POA: Diagnosis not present

## 2017-10-14 DIAGNOSIS — R450 Nervousness: Secondary | ICD-10-CM | POA: Diagnosis not present

## 2017-10-14 MED ORDER — ONDANSETRON HCL 4 MG PO TABS
4.0000 mg | ORAL_TABLET | Freq: Three times a day (TID) | ORAL | Status: DC | PRN
  Administered 2017-10-14: 4 mg via ORAL
  Filled 2017-10-14: qty 1

## 2017-10-14 MED ORDER — ACETAMINOPHEN 325 MG PO TABS: 650.0000 mg | ORAL_TABLET | Freq: Four times a day (QID) | ORAL | Status: DC | PRN

## 2017-10-14 MED ORDER — MAGNESIUM HYDROXIDE 400 MG/5ML PO SUSP: 30.0000 mL | Freq: Every day | ORAL | Status: DC | PRN

## 2017-10-14 MED ORDER — OLANZAPINE 5 MG PO TABS
5.0000 mg | ORAL_TABLET | Freq: Two times a day (BID) | ORAL | Status: DC
  Administered 2017-10-14 – 2017-10-18 (×8): 5 mg via ORAL
  Filled 2017-10-14 (×3): qty 1
  Filled 2017-10-14: qty 2
  Filled 2017-10-14 (×9): qty 1

## 2017-10-14 MED ORDER — OLANZAPINE 10 MG IM SOLR
5.0000 mg | Freq: Two times a day (BID) | INTRAMUSCULAR | Status: DC
  Filled 2017-10-14 (×12): qty 10

## 2017-10-14 MED ORDER — ALUM & MAG HYDROXIDE-SIMETH 200-200-20 MG/5ML PO SUSP: 30.0000 mL | Freq: Four times a day (QID) | ORAL | Status: DC | PRN

## 2017-10-14 MED ORDER — ACETAMINOPHEN 325 MG PO TABS: 650.0000 mg | ORAL_TABLET | ORAL | Status: DC | PRN

## 2017-10-14 MED ORDER — ALUM & MAG HYDROXIDE-SIMETH 200-200-20 MG/5ML PO SUSP: 30.0000 mL | ORAL | Status: DC | PRN

## 2017-10-14 NOTE — Progress Notes (Signed)
Patient ID: Amy Mcclain, female   DOB: 1985/08/23, 32 y.o.   MRN: [id number] Patient was very cautious during the admission interview.  Patient refused to sign any paperwork without her husband present.  Patient currently denies SI, HI and AVH and reports that she and her husband had a disagreement and had made up.  Patient continually asked to be discharged to home because she was afraid that her children were not being properly cared for because she was not there.   Skin assess ment complete and patient found to be free of all injury and contraband. Patient admitted to the unit without incident.
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
## 2017-10-14 NOTE — [facility] Group Notes (Signed)
[facility] LCSW Group Therapy  10/14/2017  1:15 PM  Type of Therapy:  Group therapy  Participation Level:  Did not attend  Participation Quality:    Affect:    Cognitive:    Insight:    Engagement in Therapy:   Modes of Intervention:  Discussion, Socialization  Summary of Progress/Problems:  Chaplain was here to lead a group on themes of hope and courage.  Greg Janan Bogie, LCSW  

## 2017-10-14 NOTE — Tx Team (Signed)
Initial Treatment Plan 10/14/2017 6:24 PM [name] AOZ:[id number]    PATIENT STRESSORS: Marital or family conflict   PATIENT STRENGTHS: Religious Affiliation   PATIENT IDENTIFIED PROBLEMS: "Patient stated I want to go home"  "Nothing is wrong with me"                   DISCHARGE CRITERIA:  Reduction of life-threatening or endangering symptoms to within safe limits  PRELIMINARY DISCHARGE PLAN: Return to previous living arrangement  PATIENT/FAMILY INVOLVEMENT: This treatment plan has been presented to and reviewed with the patient, Amy Mcclain, and/or family member,The patient and family have been given the opportunity to ask questions and make suggestions.  Jerrye Bushy, RN 10/14/2017, 6:24 PM
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
## 2017-10-14 NOTE — Progress Notes (Signed)
Did not attended group 

## 2017-10-14 NOTE — ED Notes (Signed)
Ronnie RN at [employer] called to get report on patient.

## 2017-10-14 NOTE — Plan of Care (Signed)
D: Pt  stated she did not understand english when  engaged with Clinical research associate, but she did express needing something for "nausea" .  A: Pt was offered support and encouragement. Pt was given scheduled medications. Pt was encourage to attend groups. Q 15 minute checks were done for safety.  R: safety maintained on unit.   Problem: Safety: Goal: Periods of time without injury will increase Outcome: Progressing

## 2017-10-14 NOTE — ED Notes (Signed)
Patient alert cooperative.  Took PO medication without difficulty.

## 2017-10-14 NOTE — BH Assessment (Signed)
[facility] Assessment Progress Note  Per Juanetta Beets, DO, this pt requires psychiatric hospitalization.  Malva Limes, RN, [facility] has assigned pt to [facility] Rm [id number].  Pt presents under IVC initiated by pt's husband, and upheld by Dr Sharma Covert, and IVC documents have been faxed to [facility].  Pt's nurse, Aram Beecham, has been notified, and agrees to call report to [phone].  Pt is to be transported via Patent [employer].   Doylene Canning, [place] Behavioral Health Coordinator [phone]

## 2017-10-14 NOTE — BH Assessment (Signed)
Patient refused to answer CSSRS questions with interpreter present.

## 2017-10-15 DIAGNOSIS — F203 Undifferentiated schizophrenia: Principal | ICD-10-CM

## 2017-10-15 LAB — URINE CULTURE: Culture: NO GROWTH

## 2017-10-15 MED ORDER — SULFAMETHOXAZOLE-TRIMETHOPRIM 800-160 MG PO TABS
1.0000 | ORAL_TABLET | Freq: Two times a day (BID) | ORAL | Status: AC
  Administered 2017-10-15 – 2017-10-19 (×10): 1 via ORAL
  Filled 2017-10-15 (×11): qty 1

## 2017-10-15 MED ORDER — POTASSIUM CHLORIDE CRYS ER 20 MEQ PO TBCR
20.0000 meq | EXTENDED_RELEASE_TABLET | Freq: Two times a day (BID) | ORAL | Status: AC
  Administered 2017-10-15 – 2017-10-16 (×4): 20 meq via ORAL
  Filled 2017-10-15 (×5): qty 1

## 2017-10-15 NOTE — [facility] Group Notes (Signed)
[facility] Group Notes:  (Nursing/MHT/Case Management/Adjunct)  Date:  10/15/2017  Time:  1:06 PM  Type of Therapy:  Psychoeducational Skills  Participation Level:  Did Not Attend  Participation Quality:  Did not attend  Affect:  Did not attend  Cognitive:  Did not attend  Insight:  None  Engagement in Group:  Did not attend  Modes of Intervention:  Did not attend  Summary of Progress/Problems: Pt did not attend Psychoeducational group with topic anger management.  Jacquelyne Balint Shanta 10/15/2017, 1:06 PM

## 2017-10-15 NOTE — Progress Notes (Signed)
Did not attend group 

## 2017-10-15 NOTE — [facility] Group Notes (Signed)
  [facility] LCSW Group Therapy Note  Date/Time:  10/15/2017 11:30AM-12:00PM  Type of Therapy and Topic:  Group Therapy:  Feelings About Hospitalization  Participation Level:  None   Description of Group This process group involved patients discussing their feelings related to being hospitalized, as well as the benefits they see to being in the hospital.  These feelings and benefits were itemized.  The group then brainstormed specific ways in which they could seek those same benefits when they discharge and return home.  Therapeutic Goals 1. Patient will identify and describe positive and negative feelings related to hospitalization 2. Patient will verbalize benefits of hospitalization to themselves personally 3. Patients will brainstorm together ways they can obtain similar benefits in the outpatient setting, identify barriers to wellness and possible solutions  Summary of Patient Progress:  The patient said she did not speak [place] and only shared her name.  Therapeutic Modalities Cognitive Behavioral Therapy Motivational Interviewing    Ambrose Mantle, LCSW 10/15/2017, 12:17 PM

## 2017-10-15 NOTE — H&P (Addendum)
Psychiatric Admission Assessment Adult  Patient Identification: Amy Mcclain MRN:  [id number] Date of Evaluation:  10/15/2017 Chief Complaint:  SCHIZOPHRENIA Principal Diagnosis: <principal problem not specified> Diagnosis:   Patient Active Problem List   Diagnosis Date Noted  . Schizophrenia, undifferentiated (HCC) [F20.3] 10/14/2017  . Normal labor [O80, Z37.9] 04/20/2016  . Vaginal delivery [O80] 04/20/2016  . History of RPR test [Z92.89] 02/24/2016  . Language barrier affecting health care [Z78.9] 02/23/2016  . Mental disorder affecting pregnancy, antepartum [O99.340] 11/27/2015  . Supervision of high risk pregnancy, antepartum [O09.90] 11/11/15  . Neonatal death, PTB @ 24wks [P96.9] 11-11-2015  . Involuntary commitment [Z04.6]   . Severe recurrent major depression with psychotic features (HCC) [F33.3] 08/18/2015  . Schizophrenia, unspecified (HCC) [F20.9]    History of Present Illness: Per SRA assessment note:Patient is a 32 year old Amy Mcclain female.  Translation was done by television service.  Patient has a reported history of schizophrenia.  She presented to the [facility] emergency department yesterday apparently with psychosis and threatening to throw her baby out of window.  The patient denies this.  She denied any auditory or visual hallucinations.  She did have some paranoia.  She stated that she might be being watched, and she felt as though that occasionally there was a laser being pointed out her.  She stated that she would smell things that were burning, but there was no evidence of any fires of burning around her.  She also stated she felt as though she would be "heated".  She stated that she had never had these symptoms in the [place].  Reportedly she had been on medications in the [place] for the last 7 years for schizophrenia.  She stated the only medication she was taking were "vitamins".  She was admitted to the hospital for evaluation and stabilization.  Evaluation:  patient was evaluated by MD and NP via stausue interpretation Aerobic. ( translator ID number [id number])  Patient continues to request to be discharged.  Reports some paranoia ideations. States she feels someone is watching her cook food and has lasers pointed at her. Reports her bed feels heated at  times" .  Reports 3 children at home.  Denies taking medications daily or previous inpatient admissions. .  Reports her doctor asked her to stop taking medication for 2 years which was vitamin  D.  NP to initiate Cipro for possible UTI and  potassium Klor-Con 4 doses 20 mEq for low potassium level 3.2. Repeat CMP on 10/18/2017. Support encouragement and reassurance was provided   Associated Signs/Symptoms: Depression Symptoms:  depressed mood, feelings of worthlessness/guilt, difficulty concentrating, (Hypo) Manic Symptoms:  Distractibility, Irritable Mood, Labiality of Mood, Anxiety Symptoms:  Excessive Worry, Psychotic Symptoms:  Paranoia, PTSD Symptoms: NA Total Time spent with patient: 30 minutes  Past Psychiatric History:   Is the patient at risk to self? Yes.    Has the patient been a risk to self in the past 6 months? Yes.    Has the patient been a risk to self within the distant past? Yes.    Is the patient a risk to others? No.  Has the patient been a risk to others in the past 6 months? No.  Has the patient been a risk to others within the distant past? No.   Prior Inpatient Therapy:   Prior Outpatient Therapy:    Alcohol Screening: 1. How often do you have a drink containing alcohol?: Never 2. How many drinks containing alcohol do you have on a typical  day when you are drinking?: 1 or 2 3. How often do you have six or more drinks on one occasion?: Never AUDIT-C Score: 0 4. How often during the last year have you found that you were not able to stop drinking once you had started?: Never 5. How often during the last year have you failed to do what was normally expected from you  becasue of drinking?: Never 6. How often during the last year have you needed a first drink in the morning to get yourself going after a heavy drinking session?: Never 7. How often during the last year have you had a feeling of guilt of remorse after drinking?: Never 8. How often during the last year have you been unable to remember what happened the night before because you had been drinking?: Never 9. Have you or someone else been injured as a result of your drinking?: No 10. Has a relative or friend or a doctor or another health worker been concerned about your drinking or suggested you cut down?: No Alcohol Use Disorder Identification Test Final Score (AUDIT): 0 Substance Abuse History in the last 12 months:  No. Consequences of Substance Abuse: NA Previous Psychotropic Medications: No  Psychological Evaluations: No  Past Medical History:  Past Medical History:  Diagnosis Date  . Anemia   . Depression   . Schizophrenia [facility])     Past Surgical History:  Procedure Laterality Date  . NO PAST SURGERIES     Family History: History reviewed. No pertinent family history. Family Psychiatric  History:  Tobacco Screening:   Social History:  Social History   Substance and Sexual Activity  Alcohol Use No  . Alcohol/week: 0.0 oz     Social History   Substance and Sexual Activity  Drug Use No    Additional Social History:                           Allergies:   Allergies  Allergen Reactions  . Ceftin [Cefuroxime Axetil] Rash    bruising   Lab Results:  Results for orders placed or performed during the hospital encounter of 10/11/17 (from the past 48 hour(s))  Urinalysis, Routine w reflex microscopic     Status: None   Collection Time: 10/13/17  1:15 PM  Result Value Ref Range   Color, Urine YELLOW YELLOW   APPearance CLEAR CLEAR   Specific Gravity, Urine 1.012 1.005 - 1.030   pH 6.0 5.0 - 8.0   Glucose, UA NEGATIVE NEGATIVE mg/dL   Hgb urine dipstick NEGATIVE  NEGATIVE   Bilirubin Urine NEGATIVE NEGATIVE   Ketones, ur NEGATIVE NEGATIVE mg/dL   Protein, ur NEGATIVE NEGATIVE mg/dL   Nitrite NEGATIVE NEGATIVE   Leukocytes, UA NEGATIVE NEGATIVE    Comment: Performed at [facility], [address] [place], [place] [address]  Urinalysis, Complete w Microscopic     Status: None   Collection Time: 10/13/17  1:15 PM  Result Value Ref Range   Color, Urine YELLOW YELLOW   APPearance CLEAR CLEAR   Specific Gravity, Urine 1.013 1.005 - 1.030   pH 6.0 5.0 - 8.0   Glucose, UA NEGATIVE NEGATIVE mg/dL   Hgb urine dipstick NEGATIVE NEGATIVE   Bilirubin Urine NEGATIVE NEGATIVE   Ketones, ur NEGATIVE NEGATIVE mg/dL   Protein, ur NEGATIVE NEGATIVE mg/dL   Nitrite NEGATIVE NEGATIVE   Leukocytes, UA NEGATIVE NEGATIVE   RBC / HPF 0-5 0 - 5 RBC/hpf  WBC, UA 0-5 0 - 5 WBC/hpf   Bacteria, UA NONE SEEN NONE SEEN   Squamous Epithelial / LPF 0-5 0 - 5   Mucus PRESENT     Comment: Performed at [facility], [address] [place], [place] [address]  Urine culture     Status: None   Collection Time: 10/13/17  1:15 PM  Result Value Ref Range   Specimen Description      URINE, RANDOM Performed at [facility], [address], [place], [place] [address]    Special Requests      NONE Performed at [facility], [address], [place], [place] [address]    Culture      NO GROWTH Performed at [facility], [address], [place], [place] [address]    Report Status 10/15/2017 FINAL     Blood Alcohol level:  Lab Results  Component Value Date   ETH <10 10/11/2017   ETH <5 08/18/2015    Metabolic Disorder Labs:  No results found for: HGBA1C, MPG No results found for: PROLACTIN No results found for: CHOL, TRIG, HDL, CHOLHDL, VLDL, LDLCALC  Current Medications: Current Facility-Administered Medications  Medication Dose Route Frequency Provider Last Rate Last Dose  .  acetaminophen (TYLENOL) tablet 650 mg  650 mg Oral Q6H PRN Charm Rings, NP      . alum & mag hydroxide-simeth (MAALOX/MYLANTA) 200-200-20 MG/5ML suspension 30 mL  30 mL Oral Q6H PRN Charm Rings, NP      . magnesium hydroxide (MILK OF MAGNESIA) suspension 30 mL  30 mL Oral Daily PRN Charm Rings, NP      . OLANZapine (ZYPREXA) tablet 5 mg  5 mg Oral BID Charm Rings, NP   5 mg at 10/15/17 0818   Or  . OLANZapine (ZYPREXA) injection 5 mg  5 mg Intramuscular BID Charm Rings, NP      . ondansetron [facility]) tablet 4 mg  4 mg Oral Q8H PRN Charm Rings, NP   4 mg at 10/14/17 2044  . potassium chloride SA (K-DUR,KLOR-CON) CR tablet 20 mEq  20 mEq Oral BID Oneta Rack, NP   20 mEq at 10/15/17 1027  . sulfamethoxazole-trimethoprim (BACTRIM DS,SEPTRA DS) 800-160 MG per tablet 1 tablet  1 tablet Oral Q12H Oneta Rack, NP   1 tablet at 10/15/17 1027   PTA Medications: No medications prior to admission.    Musculoskeletal: Strength & Muscle Tone: within normal limits Gait & Station: normal Patient leans: N/A  Psychiatric Specialty Exam: Physical Exam  Constitutional: She appears well-developed.  Neurological: She is alert.  Psychiatric: She has a normal mood and affect. Her behavior is normal.    Review of Systems  Psychiatric/Behavioral: Positive for hallucinations. The patient is nervous/anxious.   All other systems reviewed and are negative.   Blood pressure 101/65, pulse (!) 140, temperature 98.5 F (36.9 C), temperature source Oral, resp. rate 20, SpO2 99 %, unknown if currently breastfeeding.There is no height or weight on file to calculate BMI.  General Appearance: Casual  Eye Contact:  Fair  Speech:  Clear and Coherent  Volume:  Normal  Mood:  Anxious, Dysphoric and Irritable  Affect:  Flat  Thought Process:  Linear  Orientation:  Full (Time, Place, and Person)  Thought Content:  Paranoid Ideation  Suicidal Thoughts:  No  Homicidal Thoughts:  No   Memory:  Immediate;   Fair Recent;   Fair  Judgement:  Fair  Insight:  Lacking  Psychomotor Activity:  Normal  Concentration:  Concentration: Fair  Recall:  [employer] of Knowledge:  Fair  Language:  Fair  Akathisia:  No  Handed:  Right  AIMS (if indicated):     Assets:  Communication Skills Desire for Improvement Financial Resources/Insurance Social Support Talents/Skills  ADL's:  Intact  Cognition:  WNL  Sleep:  Number of Hours: 6.75    Treatment Plan Summary: Daily contact with patient to assess and evaluate symptoms and progress in treatment and Medication management  Observation Level/Precautions:  15 minute checks  Laboratory:  CBC Chemistry Profile UDS UA  Psychotherapy: indivial and group session   Medications:  See SRA by Md   Consultations:  CSW and Psychiatry   Discharge Concerns:  Safety, stabilization, and risk of access to medication and medication stabilization   Estimated LOS: 5-7days   Other:     Physician Treatment Plan for Primary Diagnosis: <principal problem not specified> Long Term Goal(s): Improvement in symptoms so as ready for discharge  Short Term Goals: Ability to identify changes in lifestyle to reduce recurrence of condition will improve, Ability to demonstrate self-control will improve and Ability to maintain clinical measurements within normal limits will improve  Physician Treatment Plan for Secondary Diagnosis: Active Problems:   Schizophrenia, undifferentiated (HCC)  Long Term Goal(s): Improvement in symptoms so as ready for discharge  Short Term Goals: Ability to verbalize feelings will improve and Compliance with prescribed medications will improve  I certify that inpatient services furnished can reasonably be expected to improve the patient's condition.    Oneta Rack, NP 5/18/20191:13 PM

## 2017-10-15 NOTE — BHH Group Notes (Signed)
BHH Group Notes:  (Nursing/MHT/Case Management/Adjunct)  Date:  10/15/2017  Time:  1:06 PM  Type of Therapy:  Orientation/Goals group  Participation Level:  Did Not Attend  Participation Quality:  Did Not Attend  Affect:  Did Not Attend  Cognitive:  Did Not Attend  Insight:  None  Engagement in Group:  Did Not Attend  Modes of Intervention:  Did Not Attend  Summary of Progress/Problems: Pt did not attend patient self inventory group/orientation group.  Jacquelyne Balint Shanta 10/15/2017, 1:06 PM

## 2017-10-15 NOTE — Progress Notes (Signed)
D. Pt presents with an anxious affect and mood, speaks very little english. Pt calm and cooperative during interview (using [employer] arabic interpreter) Pt reports having slept well last night- is unable to explain the reason for her admission and is inquiring about her discharge. Pt currently denies pain, SI/HI and AV hallucinations- does not appear to be responding to internal stimuli. A. Labs and vitals monitored. Pt compliant with medications. Pt supported emotionally and encouraged to express concerns and ask questions.   R. Pt remains safe with 15 minute checks. Will continue POC.

## 2017-10-15 NOTE — BHH Suicide Risk Assessment (Signed)
[facility] Admission Suicide Risk Assessment   Nursing information obtained from:  Patient Demographic factors:  Unemployed Current Mental Status:  Plan to harm others Loss Factors:  NA Historical Factors:  NA Risk Reduction Factors:  Responsible for children under 33 years of age, Living with another person, especially a relative, Positive social support, Sense of responsibility to family  Total Time spent with patient: 45 minutes Principal Problem: <principal problem not specified> Diagnosis:   Patient Active Problem List   Diagnosis Date Noted  . Schizophrenia, undifferentiated (HCC) [F20.3] 10/14/2017  . Normal labor [O80, Z37.9] 04/20/2016  . Vaginal delivery [O80] 04/20/2016  . History of RPR test [Z92.89] 02/24/2016  . Language barrier affecting health care [Z78.9] 02/23/2016  . Mental disorder affecting pregnancy, antepartum [O99.340] 11/27/2015  . Supervision of high risk pregnancy, antepartum [O09.90] 08-Nov-2015  . Neonatal death, PTB @ 24wks [P96.9] 11-08-15  . Involuntary commitment [Z04.6]   . Severe recurrent major depression with psychotic features (HCC) [F33.3] 08/18/2015  . Schizophrenia, unspecified (HCC) [F20.9]    Subjective Data: Patient is seen and examined.  Patient is [name].  Translation was done by television service.  Patient has a reported history of schizophrenia.  She presented to the [facility] emergency department yesterday apparently with psychosis and threatening to throw her baby out of window.  The patient denies this.  She denied any auditory or visual hallucinations.  She did have some paranoia.  She stated that she might be being watched, and she felt as though that occasionally there was a laser being pointed out her.  She stated that she would smell things that were burning, but there was no evidence of any fires of burning around her.  She also stated she felt as though she would be "heated".  She stated that she had never had  these symptoms in the [place].  Reportedly she had been on medications in the [place] for the last 7 years for schizophrenia.  She stated the only medication she was taking were "vitamins".  She was admitted to the hospital for evaluation and stabilization.  Continued Clinical Symptoms:  Alcohol Use Disorder Identification Test Final Score (AUDIT): 0 The "Alcohol Use Disorders Identification Test", Guidelines for Use in Primary Care, Second Edition.  World Science writer [facility]). Score between 0-7:  no or low risk or alcohol related problems. Score between 8-15:  moderate risk of alcohol related problems. Score between 16-19:  high risk of alcohol related problems. Score 20 or above:  warrants further diagnostic evaluation for alcohol dependence and treatment.   CLINICAL FACTORS:   Schizophrenia:   Paranoid or undifferentiated type   Musculoskeletal: Strength & Muscle Tone: within normal limits Gait & Station: normal Patient leans: N/A  Psychiatric Specialty Exam: Physical Exam  Nursing note and vitals reviewed. Constitutional: She appears well-developed and well-nourished.  HENT:  Head: Normocephalic and atraumatic.  Respiratory: Effort normal.  Musculoskeletal: Normal range of motion.  Neurological: She is alert.    ROS  Blood pressure 101/65, pulse (!) 140, temperature 98.5 F (36.9 C), temperature source Oral, resp. rate 20, SpO2 99 %, unknown if currently breastfeeding.There is no height or weight on file to calculate BMI.  General Appearance: Fairly Groomed  Eye Contact:  Poor  Speech:  Slow  Volume:  Decreased  Mood:  Dysphoric  Affect:  Blunt  Thought Process:  Coherent  Orientation:  Full (Time, Place, and Person)  Thought Content:  Paranoid Ideation  Suicidal Thoughts:  No  Homicidal  Thoughts:  No  Memory:  Immediate;   Poor  Judgement:  Impaired  Insight:  Lacking  Psychomotor Activity:  Restlessness  Concentration:  Concentration: Fair  Recall:  [employer]  of Knowledge:  Fair  Language:  Fair  Akathisia:  Negative  Handed:  Right  AIMS (if indicated):     Assets:  Housing Physical Health Resilience Social Support  ADL's:  Intact  Cognition:  WNL  Sleep:  Number of Hours: 6.75      COGNITIVE FEATURES THAT CONTRIBUTE TO RISK:  None    SUICIDE RISK:   Mild:  Suicidal ideation of limited frequency, intensity, duration, and specificity.  There are no identifiable plans, no associated intent, mild dysphoria and related symptoms, good self-control (both objective and subjective assessment), few other risk factors, and identifiable protective factors, including available and accessible social support.  PLAN OF CARE: Patient is seen and examined.  Patient is [name] admitted to the emergency department because of psychosis and paranoia.  She will be admitted to the 500 hall.  She will be integrated into the milieu.  She will be started on olanzapine.  She will be encouraged to attend groups.  She also has a urinary tract infection, and be treated with Bactrim.  She also has a low potassium level, and this will be supplemented.  We will get collateral information from her family.  Hopefully we can track down what kind of medication she was taking. I certify that inpatient services furnished can reasonably be expected to improve the patient's condition.   Antonieta Pert, MD 10/15/2017, 10:50 AM

## 2017-10-16 DIAGNOSIS — R450 Nervousness: Secondary | ICD-10-CM

## 2017-10-16 DIAGNOSIS — F329 Major depressive disorder, single episode, unspecified: Secondary | ICD-10-CM

## 2017-10-16 DIAGNOSIS — F419 Anxiety disorder, unspecified: Secondary | ICD-10-CM

## 2017-10-16 DIAGNOSIS — G4700 Insomnia, unspecified: Secondary | ICD-10-CM

## 2017-10-16 DIAGNOSIS — F209 Schizophrenia, unspecified: Secondary | ICD-10-CM

## 2017-10-16 MED ORDER — TRAZODONE HCL 50 MG PO TABS
50.0000 mg | ORAL_TABLET | Freq: Every evening | ORAL | Status: DC | PRN
  Administered 2017-10-16 – 2017-10-18 (×3): 50 mg via ORAL
  Filled 2017-10-16 (×11): qty 1

## 2017-10-16 NOTE — BHH Counselor (Signed)
Adult Comprehensive Assessment  Patient ID: Amy Mcclain, female   DOB: [birth year], 32 y.o.   MRN: [id number]  Information Source: Information source: Interpreter, Patient(Interpreter [phone])  Current Stressors:  Educational / Learning stressors: Denies stressors Employment / Job issues: Denies stressors Family Relationships: Denies Chief Technology Officer / Lack of resources (include bankruptcy): Denies stressors Housing / Lack of housing: There is a bad smell in the house, sometimes like burning, sometimes like blood, sometimes like poop.  The water becomes very dirty.  The house itself is fine. Physical health (include injuries & life threatening diseases): Denies stressors Social relationships: Denies stressors, "100% good." Substance abuse: Denies stressors Bereavement / Loss: Denies stressors  Living/Environment/Situation:  Living Arrangements: Spouse/significant other, Children(husband, 3 children) Living conditions (as described by patient or guardian): There is a bad smell in the house, sometimes like burning, sometimes like blood, sometimes like poop.  The water becomes very dirty.  The house itself is fine. How long has patient lived in current situation?: Came here from [place] 3 years ago, has been in the current house for 1 year and 4 months. What is atmosphere in current home: Supportive  Family History:  Marital status: Married Number of Years Married: 14 What types of issues is patient dealing with in the relationship?: None Are you sexually active?: Yes What is your sexual orientation?: Heterosexual  Has your sexual activity been affected by drugs, alcohol, medication, or emotional stress?: Not asked Does patient have children?: Yes How many children?: 4 How is patient's relationship with their children?: 12yo, 8yo, 1-1/2yo, 3yo (does not live with her) - in the interview, the interpreter cannot understand where this child is, as she states at one point he lives elsewhere  and at one point said he passed away  Childhood History:  By whom was/is the patient raised?: Both parents Description of patient's relationship with caregiver when they were a child: Very normal relationship with mother and father Patient's description of current relationship with people who raised him/her: Still a normal relationship with parents, who still live in [place] How were you disciplined when you got in trouble as a child/adolescent?: "I never got into trouble." Does patient have siblings?: Yes Number of Siblings: 3 Description of patient's current relationship with siblings: 3 brothers - "100% percent good relationship" Did patient suffer any verbal/emotional/physical/sexual abuse as a child?: No Did patient suffer from severe childhood neglect?: No Has patient ever been sexually abused/assaulted/raped as an adolescent or adult?: No Was the patient ever a victim of a crime or a disaster?: No Witnessed domestic violence?: No Has patient been effected by domestic violence as an adult?: No  Education:  Highest grade of school patient has completed: High school - did not graduate Currently a Consulting civil engineer?: No Learning disability?: No  Employment/Work Situation:   Employment situation: Unemployed What is the longest time patient has a held a job?: Has never worked outside the home Has patient ever been in the [employer]?: No Are There Guns or Other Weapons in Your Home?: No  Financial Resources:   Financial resources: Income from spouse, Medicaid Does patient have a representative payee or guardian?: No  Alcohol/Substance Abuse:   What has been your use of drugs/alcohol within the last 12 months?: Denies all use Alcohol/Substance Abuse Treatment Hx: Denies past history Has alcohol/substance abuse ever caused legal problems?: No  Social Support System:   Patient's Community Support System: Good Describe Community Support System: Family Type of faith/religion: Muslim How does  patient's faith help to cope  with current illness?: Practices daily, gives her patience  Leisure/Recreation:   Leisure and Hobbies: "Anything that is good"  Strengths/Needs:   What things does the patient do well?: Not asked, developing a headache and wants to terminate interview In what areas does patient struggle / problems for patient: "Nothing beside the disease - I don't know what it is called."  Discharge Plan:   Does patient have access to transportation?: Yes Will patient be returning to same living situation after discharge?: Yes Currently receiving community mental health services: No If no, would patient like referral for services when discharged?: Yes (What county?)([place], [place]) Does patient have financial barriers related to discharge medications?: No  Summary/Recommendations:   Summary and Recommendations (to be completed by the evaluator): Patient is a 32yo female who requires an Arabic interpreter, admitted under IVC with aggression, delusions, and auditory/visual hallucinations pursuant to medication non-compliance for Schizophrenia and Depression.  She stopped her medications during pregnancy and while breastfeeding baby born 11/201.   Primary stressors reported include bad smells and water in her house, and she denies other stressors.  She does not want to sign any paperwork, including permission to contact husband, without husband present.  Patient will benefit from crisis stabilization, medication evaluation, group therapy and psychoeducation, in addition to case management for discharge planning. At discharge it is recommended that Patient adhere to the established discharge plan and continue in treatment.  Lynnell Chad. 10/16/2017

## 2017-10-16 NOTE — Progress Notes (Signed)
[facility] MD Progress Note  10/16/2017 2:18 PM Amy Mcclain  MRN:  [id number] Subjective:    Amy Mcclain seen resting in bed. Presents flat and guarded during this assessment.  Continues to deny suicidal or homicidal ideations, denies paranoia or psychosis.  Patient is requesting to be discharged home.  Reports she is taking medications as prescribed by mouth, states she does not want anymore injections.  Of note patient has not received any medication injections since her behavioral health admission.  Patient has been compliant with medications. Denies side effects i.e. Nausea,vomiting or diarrhea.  Reports she is sleeping okay. Patient continues to minimize her symptoms. Support and encouragement and reassurance was provided.  Arabic translator [phone]  Collateral information: Rhia provided verbal permission to speak to her husband via translation.  Husband confirm information provided on admission.  Reports patient has been taking medications prior to her last pregnancy for paranoia and depression.  Reports she is unable to recall her diagnosis nor the medication that she was prescribed.  Has been is requesting to keep Niharika inpatient because she is stabilized on medications.  Reports he is fearful that she will hurt the children and herself.  History: Patient is [name]. Translation was done by television service. Patient has a reported history of schizophrenia. She presented to the [facility] emergency department yesterday apparently with psychosis and threatening to throw her baby out of window. The patient denies this. She denied any auditory or visual hallucinations. She did have some paranoia. She stated that she might be being watched, and she felt as though that occasionally there was a laser being pointed out her. She stated that she would smell things that were burning, but there was no evidence of any fires of burning around her. She also stated she felt as though she would  be "heated". She stated that she had never had these symptoms in the [place]. Reportedly she had been on medications in the [place] for the last 7 years for schizophrenia. She stated the only medication she was taking were "vitamins". She was admitted to the hospital for evaluation and stabilization.      Principal Problem: <principal problem not specified> Diagnosis:   Patient Active Problem List   Diagnosis Date Noted  . Schizophrenia, undifferentiated (HCC) [F20.3] 10/14/2017  . Normal labor [O80, Z37.9] 04/20/2016  . Vaginal delivery [O80] 04/20/2016  . History of RPR test [Z92.89] 02/24/2016  . Language barrier affecting health care [Z78.9] 02/23/2016  . Mental disorder affecting pregnancy, antepartum [O99.340] 11/27/2015  . Supervision of high risk pregnancy, antepartum [O09.90] 10/29/2015  . Neonatal death, PTB @ 24wks [P96.9] 2015-10-29  . Involuntary commitment [Z04.6]   . Severe recurrent major depression with psychotic features (HCC) [F33.3] 08/18/2015  . Schizophrenia, unspecified (HCC) [F20.9]    Total Time spent with patient: 30 minutes  Past Psychiatric History:    Past Medical History:  Past Medical History:  Diagnosis Date  . Anemia   . Depression   . Schizophrenia [facility])     Past Surgical History:  Procedure Laterality Date  . NO PAST SURGERIES     Family History: History reviewed. No pertinent family history. Family Psychiatric  History:  Social History:  Social History   Substance and Sexual Activity  Alcohol Use No  . Alcohol/week: 0.0 oz     Social History   Substance and Sexual Activity  Drug Use No    Social History   Socioeconomic History  . Marital status: Married  Spouse name: Not on file  . Number of children: Not on file  . Years of education: Not on file  . Highest education level: Not on file  Occupational History  . Not on file  Social Needs  . Financial resource strain: Not on file  . Food insecurity:    Worry: Not on  file    Inability: Not on file  . Transportation needs:    Medical: Not on file    Non-medical: Not on file  Tobacco Use  . Smoking status: Never Smoker  . Smokeless tobacco: Never Used  Substance and Sexual Activity  . Alcohol use: No    Alcohol/week: 0.0 oz  . Drug use: No  . Sexual activity: Yes  Lifestyle  . Physical activity:    Days per week: Not on file    Minutes per session: Not on file  . Stress: Not on file  Relationships  . Social connections:    Talks on phone: Not on file    Gets together: Not on file    Attends religious service: Not on file    Active member of club or organization: Not on file    Attends meetings of clubs or organizations: Not on file    Relationship status: Not on file  Other Topics Concern  . Not on file  Social History Narrative  . Not on file   Additional Social History:                         Sleep: Fair  Appetite:  Fair  Current Medications: Current Facility-Administered Medications  Medication Dose Route Frequency Provider Last Rate Last Dose  . acetaminophen (TYLENOL) tablet 650 mg  650 mg Oral Q6H PRN Charm Rings, NP      . alum & mag hydroxide-simeth (MAALOX/MYLANTA) 200-200-20 MG/5ML suspension 30 mL  30 mL Oral Q6H PRN Charm Rings, NP      . magnesium hydroxide (MILK OF MAGNESIA) suspension 30 mL  30 mL Oral Daily PRN Charm Rings, NP      . OLANZapine (ZYPREXA) tablet 5 mg  5 mg Oral BID Charm Rings, NP   5 mg at 10/16/17 0737   Or  . OLANZapine (ZYPREXA) injection 5 mg  5 mg Intramuscular BID Charm Rings, NP      . ondansetron [facility]) tablet 4 mg  4 mg Oral Q8H PRN Charm Rings, NP   4 mg at 10/14/17 2044  . potassium chloride SA (K-DUR,KLOR-CON) CR tablet 20 mEq  20 mEq Oral BID Oneta Rack, NP   20 mEq at 10/16/17 0737  . sulfamethoxazole-trimethoprim (BACTRIM DS,SEPTRA DS) 800-160 MG per tablet 1 tablet  1 tablet Oral Q12H Oneta Rack, NP   1 tablet at 10/16/17 1610    Lab  Results: No results found for this or any previous visit (from the past 48 hour(s)).  Blood Alcohol level:  Lab Results  Component Value Date   ETH <10 10/11/2017   ETH <5 08/18/2015    Metabolic Disorder Labs: No results found for: HGBA1C, MPG No results found for: PROLACTIN No results found for: CHOL, TRIG, HDL, CHOLHDL, VLDL, LDLCALC  Physical Findings: AIMS:  , ,  ,  ,    CIWA:    COWS:     Musculoskeletal: Strength & Muscle Tone: within normal limits Gait & Station: normal Patient leans: N/A  Psychiatric Specialty Exam: Physical Exam  Nursing note and  vitals reviewed. Constitutional: She is oriented to person, place, and time. She appears well-developed.  Neurological: She is alert and oriented to person, place, and time.  Psychiatric: She has a normal mood and affect. Her behavior is normal.    Review of Systems  Psychiatric/Behavioral: Positive for depression. The patient is nervous/anxious.   All other systems reviewed and are negative.   Blood pressure 104/70, pulse (!) 108, temperature 98.6 F (37 C), temperature source Oral, resp. rate 18, SpO2 99 %, unknown if currently breastfeeding.There is no height or weight on file to calculate BMI.  General Appearance: Casual  Eye Contact:  Fair  Speech:  Clear and Coherent  Volume:  Normal  Mood:  Depressed  Affect:  Congruent  Thought Process:  Coherent  Orientation:  Full (Time, Place, and Person)  Thought Content:  Hallucinations: was denied   Suicidal Thoughts:  No  Homicidal Thoughts:  No  Memory:  Immediate;   Fair Recent;   Fair Remote;   Fair  Judgement:  Fair  Insight:  Fair  Psychomotor Activity:  Normal  Concentration:  Concentration: Fair  Recall:  [employer] of Knowledge:  Fair  Language:  Fair  Akathisia:  No  Handed:  Right  AIMS (if indicated):     Assets:  Communication Skills Desire for Improvement Resilience Social Support  ADL's:  Intact  Cognition:  WNL  Sleep:  Number of  Hours: 6.75     Treatment Plan Summary: Daily contact with patient to assess and evaluate symptoms and progress in treatment and Medication management   Continue with current treatment plan listed below on 10/16/2017 except were noted  Continue with  Zyprexa  PO BID mood stabilization. Continue with Trazodone  50 mg for insomnia  Will continue to monitor vitals ,medication compliance and treatment side effects while patient is here.   Reviewed labs: Potassium 3.2 on admission repeat CMP on 5/20 CSW will continue working on disposition.  Patient to participate in therapeutic milieu  Oneta Rack, NP 10/16/2017, 2:18 PM

## 2017-10-16 NOTE — [facility] Group Notes (Signed)
[facility] Group Notes:  (Nursing)  Date:  10/16/2017  Time:  1:30 PM Type of Therapy:  Nurse Education  Participation Level:  Did Not Attend  Participation Quality:  did not attend  Affect:  did not attend  Cognitive:  did not attend  Insight:  None  Engagement in Group:  None  Modes of Intervention:  did not attend  Summary of Progress/Problems:  Amy Mcclain 10/16/2017, 3:07 PM

## 2017-10-16 NOTE — Plan of Care (Signed)
D: Pt keeps to herself most of the evening. Pt concerned about leaving.  A: Pt was offered support and encouragement. Pt was given scheduled medications. Pt was encourage to attend groups. Q 15 minute checks were done for safety.  R: safety maintained on unit.   Problem: Activity: Goal: Sleeping patterns will improve Outcome: Progressing   Problem: Safety: Goal: Periods of time without injury will increase Outcome: Progressing

## 2017-10-16 NOTE — Plan of Care (Signed)
D:Patient voiced concerns with this Clinical research associate that she desires to go home. Patient states she wants to see her child. Patient assessment performed with assistance of interpreter. Patient sad because she can not leave. Patient was in her room most of the shift.  A:Patient provided support and encouragement. Patient did not understand why she can not leave tomorrow. Patient with Q 15 minute checks in progress. R:Patient remains safe on unit. Patient voiced she did not come to group because she did not understand. Patient is taking medication as ordered. Patient educated on medication. Patient verbalize understanding and voiced no other concerns.   Problem: Activity: Goal: Sleeping patterns will improve Outcome: Progressing Note:  Patient did sleep uninterrupted tonight.   Problem: Safety: Goal: Periods of time without injury will increase Outcome: Progressing Note:  Patient denies SI/HI and remains safe on unit.

## 2017-10-16 NOTE — Progress Notes (Signed)
D. Pt presents with a sad affect and depressed behavior. Pt assessment completed with assistance of [employer] interpretor- Pt currently denies pain, SI/HI and AV hallucinations. Pt voices no concerns other than wanting to be discharged- go home to family.  A. Labs and vitals monitored. Pt compliant with medications. Pt supported emotionally and encouraged to express concerns and ask questions.   R. Pt remains safe with 15 minute checks. Will continue POC.

## 2017-10-16 NOTE — [facility] Group Notes (Signed)
[facility] LCSW Group Therapy Note  Date/Time:  10/16/2017  11:00AM-12:00PM  Type of Therapy and Topic:  Group Therapy:  Music and Mood  Participation Level:  Minimal   Description of Group: In this process group, members listened to a variety of genres of music and identified that different types of music evoke different responses.  Patients were encouraged to identify music that was soothing for them and music that was energizing for them.  Patients discussed how this knowledge can help with wellness and recovery in various ways including managing depression and anxiety as well as encouraging healthy sleep habits.    Therapeutic Goals: 1. Patients will explore the impact of different varieties of music on mood 2. Patients will verbalize the thoughts they have when listening to different types of music 3. Patients will identify music that is soothing to them as well as music that is energizing to them 4. Patients will discuss how to use this knowledge to assist in maintaining wellness and recovery 5. Patients will explore the use of music as a coping skill  Summary of Patient Progress:  An interpreter was not present, so patient could not respond about how she felt at the beginning of group.  She left after 4 songs and did not return.  Therapeutic Modalities: Solution Focused Brief Therapy Activity   Ambrose Mantle, LCSW

## 2017-10-17 DIAGNOSIS — N390 Urinary tract infection, site not specified: Secondary | ICD-10-CM

## 2017-10-17 DIAGNOSIS — Z79899 Other long term (current) drug therapy: Secondary | ICD-10-CM

## 2017-10-17 LAB — COMPREHENSIVE METABOLIC PANEL
ALBUMIN: 3.9 g/dL (ref 3.5–5.0)
ALK PHOS: 61 U/L (ref 38–126)
ALT: 17 U/L (ref 14–54)
AST: 17 U/L (ref 15–41)
Anion gap: 10 (ref 5–15)
BUN: 9 mg/dL (ref 6–20)
CALCIUM: 8.8 mg/dL — AB (ref 8.9–10.3)
CO2: 21 mmol/L — AB (ref 22–32)
CREATININE: 0.62 mg/dL (ref 0.44–1.00)
Chloride: 105 mmol/L (ref 101–111)
GFR calc Af Amer: >60 mL/min (ref >60)
GFR calc non Af Amer: >60 mL/min (ref >60)
GLUCOSE: 98 mg/dL (ref 65–99)
Potassium: 4.1 mmol/L (ref 3.5–5.1)
SODIUM: 136 mmol/L (ref 135–145)
Total Bilirubin: 0.2 mg/dL — ABNORMAL LOW (ref 0.3–1.2)
Total Protein: 7.2 g/dL (ref 6.5–8.1)

## 2017-10-17 MED ORDER — HYDROXYZINE HCL 50 MG PO TABS
50.0000 mg | ORAL_TABLET | Freq: Four times a day (QID) | ORAL | Status: DC | PRN
  Administered 2017-10-18 – 2017-10-20 (×3): 50 mg via ORAL
  Filled 2017-10-17 (×2): qty 1
  Filled 2017-10-17: qty 10
  Filled 2017-10-17: qty 1

## 2017-10-17 NOTE — [facility] Group Notes (Signed)
LCSW Group Therapy Note   10/17/2017 1:15pm   Type of Therapy and Topic:  Group Therapy:  Overcoming Obstacles   Participation Level:  Did Not Attend   Description of Group:    In this group patients will be encouraged to explore what they see as obstacles to their own wellness and recovery. They will be guided to discuss their thoughts, feelings, and behaviors related to these obstacles. The group will process together ways to cope with barriers, with attention given to specific choices patients can make. Each patient will be challenged to identify changes they are motivated to make in order to overcome their obstacles. This group will be process-oriented, with patients participating in exploration of their own experiences as well as giving and receiving support and challenge from other group members.   Therapeutic Goals: 1. Patient will identify personal and current obstacles as they relate to admission. 2. Patient will identify barriers that currently interfere with their wellness or overcoming obstacles.  3. Patient will identify feelings, thought process and behaviors related to these barriers. 4. Patient will identify two changes they are willing to make to overcome these obstacles:      Summary of Patient Progress      Therapeutic Modalities:   Cognitive Behavioral Therapy Solution Focused Therapy Motivational Interviewing Relapse Prevention Therapy  [name], LCSW 10/17/2017 3:26 PM

## 2017-10-17 NOTE — Plan of Care (Signed)
  Problem: Activity: Goal: Interest or engagement in activities will improve Outcome: Not Progressing   Problem: Health Behavior/Discharge Planning: Goal: Compliance with treatment plan for underlying cause of condition will improve Outcome: Progressing   Problem: Safety: Goal: Periods of time without injury will increase Outcome: Progressing  DAR NOTE: Patient presents with anxious affect and  mood.  Denies suicidal thoughts and auditory hallucination but report seeing lasers while in the dayroom.  Described energy level as normal and concentration as good.  Rates depression at 3, hopelessness at 0, and anxiety at 0.  Maintained on routine safety checks.  Medications given as prescribed.  Support and encouragement offered as needed.  States goal for today is "get out of here."  Patient remained in her room most of this shift.  Offered no complaint. Patient is safe on the unit.

## 2017-10-17 NOTE — Plan of Care (Signed)
Assessment done with assistance of interpretor  D: Pt denies SI/HI/AVH. Pt is pleasant and cooperative. Pt presents with sad blunted affect . Pt stated she wanted to go home to see her children.   A: Pt was offered support and encouragement. Pt was given scheduled medications. Pt was encourage to attend groups. Q 15 minute checks were done for safety.   R:Pt attends groups and interacts well with peers and staff. Pt is taking medication. Pt receptive to treatment and safety maintained on unit.   Problem: Activity: Goal: Sleeping patterns will improve Outcome: Progressing   Problem: Safety: Goal: Periods of time without injury will increase Outcome: Progressing
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
## 2017-10-18 LAB — HEMOGLOBIN A1C
Hgb A1c MFr Bld: 4.8 % (ref 4.8–5.6)
Mean Plasma Glucose: 91.06 mg/dL

## 2017-10-18 LAB — LIPID PANEL
CHOL/HDL RATIO: 3.1 ratio
CHOLESTEROL: 168 mg/dL (ref 0–200)
HDL: 54 mg/dL (ref >40)
LDL Cholesterol: 102 mg/dL — ABNORMAL HIGH (ref 0–99)
TRIGLYCERIDES: 60 mg/dL (ref <150)
VLDL: 12 mg/dL (ref 0–40)

## 2017-10-18 LAB — TSH: TSH: 1.713 u[IU]/mL (ref 0.350–4.500)

## 2017-10-18 MED ORDER — PALIPERIDONE ER 6 MG PO TB24
6.0000 mg | ORAL_TABLET | Freq: Every day | ORAL | Status: DC
  Administered 2017-10-18 – 2017-10-21 (×4): 6 mg via ORAL
  Filled 2017-10-18: qty 7
  Filled 2017-10-18 (×6): qty 1

## 2017-10-18 MED ORDER — DIPHENHYDRAMINE HCL 50 MG/ML IJ SOLN
50.0000 mg | Freq: Four times a day (QID) | INTRAMUSCULAR | Status: DC | PRN
Start: 2017-10-18 — End: 2017-10-21

## 2017-10-18 MED ORDER — DIPHENHYDRAMINE HCL 25 MG PO CAPS: 50.0000 mg | ORAL_CAPSULE | Freq: Four times a day (QID) | ORAL | Status: DC | PRN

## 2017-10-18 MED ORDER — PALIPERIDONE PALMITATE ER 156 MG/ML IM SUSY
156.0000 mg | PREFILLED_SYRINGE | Freq: Once | INTRAMUSCULAR | Status: AC
  Administered 2017-10-21: 156 mg via INTRAMUSCULAR
  Filled 2017-10-18: qty 1

## 2017-10-18 MED ORDER — PALIPERIDONE PALMITATE ER 234 MG/1.5ML IM SUSY
234.0000 mg | PREFILLED_SYRINGE | Freq: Once | INTRAMUSCULAR | Status: AC
  Administered 2017-10-18: 234 mg via INTRAMUSCULAR
  Filled 2017-10-18: qty 1.5

## 2017-10-18 NOTE — Progress Notes (Signed)
Did not attend group 

## 2017-10-18 NOTE — Plan of Care (Signed)
D: Pt keeps to herself, pt stated she was ready to leave  A: Pt was offered support and encouragement. Pt was given scheduled medications. Pt was encourage to attend groups. Q 15 minute checks were done for safety.   R:Pt attends groups and interacts well with peers and staff. Pt is taking medication. Pt receptive to treatment and safety maintained on unit.   Problem: Education: Goal: Emotional status will improve Outcome: Progressing   Problem: Activity: Goal: Sleeping patterns will improve Outcome: Progressing   Problem: Coping: Goal: Ability to demonstrate self-control will improve Outcome: Progressing   Problem: Safety: Goal: Periods of time without injury will increase Outcome: Progressing

## 2017-10-18 NOTE — Tx Team (Signed)
Interdisciplinary Treatment and Diagnostic Plan Update  10/18/2017 Time of Session: 2:28 PM  [name] MRN: [id number]  Principal Diagnosis: Schizophrenia, undifferentiated ([place])  Secondary Diagnoses: Principal Problem:   Schizophrenia, undifferentiated ([place])   Current Medications:  Current Facility-Administered Medications  Medication Dose Route Frequency Provider Last Rate Last Dose  . acetaminophen (TYLENOL) tablet 650 mg  650 mg Oral Q6H PRN Patrecia Pour, NP      . alum & mag hydroxide-simeth (MAALOX/MYLANTA) 200-200-20 MG/5ML suspension 30 mL  30 mL Oral Q6H PRN Patrecia Pour, NP      . hydrOXYzine (ATARAX/VISTARIL) tablet 50 mg  50 mg Oral Q6H PRN Pennelope Bracken, MD      . magnesium hydroxide (MILK OF MAGNESIA) suspension 30 mL  30 mL Oral Daily PRN Patrecia Pour, NP      . OLANZapine (ZYPREXA) tablet 5 mg  5 mg Oral BID Patrecia Pour, NP   5 mg at 10/18/17 3785   Or  . OLANZapine (ZYPREXA) injection 5 mg  5 mg Intramuscular BID Patrecia Pour, NP      . ondansetron [facility]) tablet 4 mg  4 mg Oral Q8H PRN Patrecia Pour, NP   4 mg at 10/14/17 2044  . sulfamethoxazole-trimethoprim (BACTRIM DS,SEPTRA DS) 800-160 MG per tablet 1 tablet  1 tablet Oral Q12H Derrill Center, NP   1 tablet at 10/18/17 0752  . traZODone (DESYREL) tablet 50 mg  50 mg Oral QHS,MR X 1 Derrill Center, NP   50 mg at 10/17/17 2118    PTA Medications: No medications prior to admission.    Patient Stressors: Marital or family conflict  Patient Strengths: Religious Affiliation  Treatment Modalities: Medication Management, Group therapy, Case management,  1 to 1 session with clinician, Psychoeducation, Recreational therapy.   Physician Treatment Plan for Primary Diagnosis: Schizophrenia, undifferentiated ([place]) Long Term Goal(s): Improvement in symptoms so as ready for discharge  Short Term Goals: Ability to identify changes in lifestyle to reduce recurrence of condition will  improve Ability to demonstrate self-control will improve Ability to maintain clinical measurements within normal limits will improve Ability to verbalize feelings will improve Compliance with prescribed medications will improve  Medication Management: Evaluate patient's response, side effects, and tolerance of medication regimen.  Therapeutic Interventions: 1 to 1 sessions, Unit Group sessions and Medication administration.  Evaluation of Outcomes: Not Met  Physician Treatment Plan for Secondary Diagnosis: Principal Problem:   Schizophrenia, undifferentiated ([place])   Long Term Goal(s): Improvement in symptoms so as ready for discharge  Short Term Goals: Ability to identify changes in lifestyle to reduce recurrence of condition will improve Ability to demonstrate self-control will improve Ability to maintain clinical measurements within normal limits will improve Ability to verbalize feelings will improve Compliance with prescribed medications will improve  Medication Management: Evaluate patient's response, side effects, and tolerance of medication regimen.  Therapeutic Interventions: 1 to 1 sessions, Unit Group sessions and Medication administration.  Evaluation of Outcomes: Not Met   RN Treatment Plan for Primary Diagnosis: Schizophrenia, undifferentiated ([place]) Long Term Goal(s): Knowledge of disease and therapeutic regimen to maintain health will improve  Short Term Goals: Ability to identify and develop effective coping behaviors will improve and Compliance with prescribed medications will improve  Medication Management: RN will administer medications as ordered by provider, will assess and evaluate patient's response and provide education to patient for prescribed medication. RN will report any adverse and/or side effects to prescribing provider.  Therapeutic Interventions: 1  on 1 counseling sessions, Psychoeducation, Medication administration, Evaluate responses to  treatment, Monitor vital signs and CBGs as ordered, Perform/monitor CIWA, COWS, AIMS and Fall Risk screenings as ordered, Perform wound care treatments as ordered.  Evaluation of Outcomes: Progressing   LCSW Treatment Plan for Primary Diagnosis: Schizophrenia, undifferentiated ([place]) Long Term Goal(s): Safe transition to appropriate next level of care at discharge, Engage patient in therapeutic group addressing interpersonal concerns.  Short Term Goals: Engage patient in aftercare planning with referrals and resources  Therapeutic Interventions: Assess for all discharge needs, 1 to 1 time with Social worker, Explore available resources and support systems, Assess for adequacy in community support network, Educate family and significant other(s) on suicide prevention, Complete Psychosocial Assessment, Interpersonal group therapy.  Evaluation of Outcomes: Met  Return home, follow up outpt   Progress in Treatment: Attending groups: No Participating in groups: No Taking medication as prescribed: Yes Toleration medication: Yes, no side effects reported at this time Family/Significant other contact made: Yes Patient understands diagnosis:No  Limited insght Discussing patient identified problems/goals with staff: Yes Medical problems stabilized or resolved: Yes Denies suicidal/homicidal ideation: Yes Issues/concerns per patient self-inventory: None Other: N/A  New problem(s) identified: None identified at this time.   New Short Term/Long Term Goal(s): "I'm ready to go home"  Discharge Plan or Barriers:   Reason for Continuation of Hospitalization:  Delusions  Depression Hallucinations Homicidal ideation  Medication stabilization   Estimated Length of Stay: 5/24  Attendees: Patient: Amy Mcclain-declined to sign 10/18/2017  2:28 PM  Physician: Maris Berger, MD 10/18/2017  2:28 PM  Nursing: Darrol Angel, RN 10/18/2017  2:28 PM  RN Care Manager: Lars Pinks, RN  10/18/2017  2:28 PM  Social Worker: Ripley Fraise 10/18/2017  2:28 PM  Recreational Therapist: Winfield Cunas 10/18/2017  2:28 PM  Other: Norberto Sorenson 10/18/2017  2:28 PM  Other:  10/18/2017  2:28 PM    Scribe for Treatment Team:  Roque Lias LCSW 10/18/2017 2:28 PM

## 2017-10-18 NOTE — Progress Notes (Signed)
[facility] MD Progress Note  10/18/2017 5:50 PM Amy Mcclain  MRN:  [id number] Subjective:    Amy Mcclain is a 32 y/o [place] F with history of  Schizophrenia who was admitted from [place] on IVC initiated by her husband with concern of worsening depression, agitation, paranoia, aggressive behavior towards her husband and children, making threat to throw her child out the window, VH of seeing lasers, olfactory hallucinations, and medication non-adherence for the past 6 months. Pt was medically cleared and then transferred to [facility] for additional treatment and stabilization. She was started on regimen of zyprexa 36m po BID. Pt has been reporting incremental improvement of her presenting symptoms.  Upon evaluation today, interview was completed with assistance of Arabic interpreter (SolicitorElenogomi). Pt shares, "I'm good." She denies specific concerns. She is sleeping well. Her appetite is good. She reports VH of "lasers" which she notes are not overly bothersome. She also reports olfactory hallucination of smelling "burning flesh." She denies SI/HI/AH. She is tolerating her medications without difficulty or side effects. Collateral information was obtained from pt's husband via telephone, and he expresses concern that pt is still having symptoms of psychosis and likely will not take her medications after discharge. He would not like her to return to home without a long-acting injectable medication. Discussed with patient about option of long-acting injectable medication, and she is in agreement. We will administer a single dose of Invega to check for allergy, and then start SQatartoday with plan to administer booster by the end of the week. Pt was in agreement with this plan, and she had no further questions, comments, or concerns.   Principal Problem: Schizophrenia, undifferentiated ([place] Diagnosis:   Patient Active Problem List   Diagnosis Date Noted  . Schizophrenia, undifferentiated ([place] [F20.3] 10/14/2017   . Normal labor [O80, Z37.9] 04/20/2016  . Vaginal delivery [O80] 04/20/2016  . History of RPR test [Z92.89] 02/24/2016  . Language barrier affecting health care [Z78.9] 02/23/2016  . Mental disorder affecting pregnancy, antepartum [O99.340] 11/27/2015  . Supervision of high risk pregnancy, antepartum [O09.90] 005-31-2017 . Neonatal death, PTB @ 24wks [P96.9] 0May 31, 2017 . Involuntary commitment [Z04.6]   . Severe recurrent major depression with psychotic features ([place] [F33.3] 08/18/2015  . Schizophrenia, unspecified ([place] [F20.9]    Total Time spent with patient: 30 minutes  Past Psychiatric History: see H&P  Past Medical History:  Past Medical History:  Diagnosis Date  . Anemia   . Depression   . Schizophrenia ([facility]     Past Surgical History:  Procedure Laterality Date  . NO PAST SURGERIES     Family History: History reviewed. No pertinent family history. Family Psychiatric  History: see H&P Social History:  Social History   Substance and Sexual Activity  Alcohol Use No  . Alcohol/week: 0.0 oz     Social History   Substance and Sexual Activity  Drug Use No    Social History   Socioeconomic History  . Marital status: Married    Spouse name: Not on file  . Number of children: Not on file  . Years of education: Not on file  . Highest education level: Not on file  Occupational History  . Not on file  Social Needs  . Financial resource strain: Not on file  . Food insecurity:    Worry: Not on file    Inability: Not on file  . Transportation needs:    Medical: Not on file    Non-medical: Not on file  Tobacco  Use  . Smoking status: Never Smoker  . Smokeless tobacco: Never Used  Substance and Sexual Activity  . Alcohol use: No    Alcohol/week: 0.0 oz  . Drug use: No  . Sexual activity: Yes  Lifestyle  . Physical activity:    Days per week: Not on file    Minutes per session: Not on file  . Stress: Not on file  Relationships  . Social connections:     Talks on phone: Not on file    Gets together: Not on file    Attends religious service: Not on file    Active member of club or organization: Not on file    Attends meetings of clubs or organizations: Not on file    Relationship status: Not on file  Other Topics Concern  . Not on file  Social History Narrative  . Not on file   Additional Social History:                         Sleep: Good  Appetite:  Good  Current Medications: Current Facility-Administered Medications  Medication Dose Route Frequency Provider Last Rate Last Dose  . acetaminophen (TYLENOL) tablet 650 mg  650 mg Oral Q6H PRN Patrecia Pour, NP      . alum & mag hydroxide-simeth (MAALOX/MYLANTA) 200-200-20 MG/5ML suspension 30 mL  30 mL Oral Q6H PRN Patrecia Pour, NP      . diphenhydrAMINE (BENADRYL) capsule 50 mg  50 mg Oral Q6H PRN Pennelope Bracken, MD       Or  . diphenhydrAMINE (BENADRYL) injection 50 mg  50 mg Intramuscular Q6H PRN Pennelope Bracken, MD      . hydrOXYzine (ATARAX/VISTARIL) tablet 50 mg  50 mg Oral Q6H PRN Pennelope Bracken, MD      . magnesium hydroxide (MILK OF MAGNESIA) suspension 30 mL  30 mL Oral Daily PRN Patrecia Pour, NP      . ondansetron [facility]) tablet 4 mg  4 mg Oral Q8H PRN Patrecia Pour, NP   4 mg at 10/14/17 2044  . [START ON 10/21/2017] paliperidone (INVEGA SUSTENNA) injection 156 mg  156 mg Intramuscular Once Maris Berger T, MD      . paliperidone (INVEGA) 24 hr tablet 6 mg  6 mg Oral Daily Pennelope Bracken, MD   6 mg at 10/18/17 1516  . sulfamethoxazole-trimethoprim (BACTRIM DS,SEPTRA DS) 800-160 MG per tablet 1 tablet  1 tablet Oral Q12H Derrill Center, NP   1 tablet at 10/18/17 0752  . traZODone (DESYREL) tablet 50 mg  50 mg Oral QHS,MR X 1 Derrill Center, NP   50 mg at 10/17/17 2118    Lab Results:  Results for orders placed or performed during the hospital encounter of 10/14/17 (from the past 48 hour(s))   Comprehensive metabolic panel     Status: Abnormal   Collection Time: 10/17/17  6:26 AM  Result Value Ref Range   Sodium 136 135 - 145 mmol/L   Potassium 4.1 3.5 - 5.1 mmol/L   Chloride 105 101 - 111 mmol/L   CO2 21 (L) 22 - 32 mmol/L   Glucose, Bld 98 65 - 99 mg/dL   BUN 9 6 - 20 mg/dL   Creatinine, Ser 0.62 0.44 - 1.00 mg/dL   Calcium 8.8 (L) 8.9 - 10.3 mg/dL   Total Protein 7.2 6.5 - 8.1 g/dL   Albumin 3.9 3.5 - 5.0 g/dL  AST 17 15 - 41 U/L   ALT 17 14 - 54 U/L   Alkaline Phosphatase 61 38 - 126 U/L   Total Bilirubin 0.2 (L) 0.3 - 1.2 mg/dL   GFR calc non Af Amer >60 >60 mL/min   GFR calc Af Amer >60 >60 mL/min    Comment: (NOTE) The eGFR has been calculated using the CKD EPI equation. This calculation has not been validated in all clinical situations. eGFR's persistently <60 mL/min signify possible Chronic Kidney Disease.    Anion gap 10 5 - 15    Comment: Performed at [facility], [place] [address] [place], [place] [address]  Hemoglobin A1c     Status: None   Collection Time: 10/18/17  6:25 AM  Result Value Ref Range   Hgb A1c MFr Bld 4.8 4.8 - 5.6 %    Comment: (NOTE) Pre diabetes:          5.7%-6.4% Diabetes:              >6.4% Glycemic control for   <7.0% adults with diabetes    Mean Plasma Glucose 91.06 mg/dL    Comment: Performed at [facility] [address], [place], [place] [address]  TSH     Status: None   Collection Time: 10/18/17  6:25 AM  Result Value Ref Range   TSH 1.713 0.350 - 4.500 uIU/mL    Comment: Performed by a 3rd Generation assay with a functional sensitivity of <=0.01 uIU/mL. Performed at [facility], [place] [address], [place], [place] [address]   Lipid panel     Status: Abnormal   Collection Time: 10/18/17  6:25 AM  Result Value Ref Range   Cholesterol 168 0 - 200 mg/dL   Triglycerides 60 <150 mg/dL   HDL 54 >40 mg/dL   Total CHOL/HDL Ratio 3.1 RATIO   VLDL 12 0 - 40 mg/dL   LDL  Cholesterol 102 (H) 0 - 99 mg/dL    Comment:        Total Cholesterol/HDL:CHD Risk Coronary Heart Disease Risk Table                     Men   Women  1/2 Average Risk   3.4   3.3  Average Risk       5.0   4.4  2 X Average Risk   9.6   7.1  3 X Average Risk  23.4   11.0        Use the calculated Patient Ratio above and the CHD Risk Table to determine the patient's CHD Risk.        ATP III CLASSIFICATION (LDL):  <100     mg/dL   Optimal  100-129  mg/dL   Near or Above                    Optimal  130-159  mg/dL   Borderline  160-189  mg/dL   High  >190     mg/dL   Very High Performed at [facility] [address], [place], [place] [address]     Blood Alcohol level:  Lab Results  Component Value Date   [facility] <10 10/11/2017   ETH <5 33/82/5053    Metabolic Disorder Labs: Lab Results  Component Value Date   HGBA1C 4.8 10/18/2017   MPG 91.06 10/18/2017   No results found for: PROLACTIN Lab Results  Component Value Date   CHOL  168 10/18/2017   TRIG 60 10/18/2017   HDL 54 10/18/2017   CHOLHDL 3.1 10/18/2017   VLDL 12 10/18/2017   LDLCALC 102 (H) 10/18/2017    Physical Findings: AIMS:  , ,  ,  ,    CIWA:    COWS:     Musculoskeletal: Strength & Muscle Tone: within normal limits Gait & Station: normal Patient leans: N/A  Psychiatric Specialty Exam: Physical Exam  Nursing note and vitals reviewed.   Review of Systems  Constitutional: Negative for chills and fever.  Respiratory: Negative for cough and shortness of breath.   Cardiovascular: Negative for chest pain.  Gastrointestinal: Negative for abdominal pain, heartburn, nausea and vomiting.  Psychiatric/Behavioral: Positive for hallucinations. Negative for depression and suicidal ideas. The patient is not nervous/anxious and does not have insomnia.     Blood pressure 116/65, pulse (!) 102, temperature 98.3 F (36.8 C), temperature source Oral, resp. rate 16, SpO2 99 %, unknown if  currently breastfeeding.There is no height or weight on file to calculate BMI.  General Appearance: Casual and Fairly Groomed  Eye Contact:  Good  Speech:  Clear and Coherent and Normal Rate  Volume:  Normal  Mood:  Anxious  Affect:  Appropriate, Congruent and Constricted  Thought Process:  Coherent and Goal Directed  Orientation:  Full (Time, Place, and Person)  Thought Content:  Hallucinations: Auditory Olfactory  Suicidal Thoughts:  No  Homicidal Thoughts:  No  Memory:  Immediate;   Fair Recent;   Fair Remote;   Fair  Judgement:  Fair  Insight:  Lacking  Psychomotor Activity:  Normal  Concentration:  Concentration: Fair  Recall:  [employer] of Knowledge:  Fair  Language:  Fair  Akathisia:  No  Handed:    AIMS (if indicated):     Assets:  Desire for Improvement Housing Physical Health Resilience Social Support  ADL's:  Intact  Cognition:  WNL  Sleep:  Number of Hours: 4.5     Treatment Plan Summary: Daily contact with patient to assess and evaluate symptoms and progress in treatment and Medication management   -Continue inpatient hospitalization  -Schizophrenia             -Discontinue olanzapine 44m po BID   -Start Invega 629mpo qday   - Start InLorayne Benderustenna 23411mM (administer today 5/21) followed by InvKirt Boys55m42m8 Days starting on 5/24  -Anxiety             -Continue vistaril 50mg455mq6h prn anxiety  -Insomnia             -Continue trazodone 50mg 50mhs (may repeat x1)  -UTI             -Continue bactrim 800-160mg t14m1 tablet q12h for 5 days total  -Encourage participation in groups and therapeutic milieu  -Disposition planning will be ongoing  ChristoPennelope Bracken21/2019, 5:50 PM

## 2017-10-18 NOTE — Plan of Care (Signed)
  Problem: Activity: Goal: Interest or engagement in activities will improve Outcome: Not Progressing   Problem: Safety: Goal: Periods of time without injury will increase Outcome: Progressing  DAR NOTE: Patient presents with anxious affect and depressed mood.  Denies suicidal thoughts and visual hallucination but continues to reports seeing lasers.  Rates depression at 0, hopelessness at 0, and anxiety at 0.  Maintained on routine safety checks.  Medications given as prescribed.  Support and encouragement offered as needed.  States goal for today is "discharge."  Patient remained in her room majority of this shift.  Remained safe on the unit.

## 2017-10-19 LAB — PROLACTIN: Prolactin: 166.7 ng/mL — ABNORMAL HIGH (ref 4.8–23.3)

## 2017-10-19 MED ORDER — TRAZODONE HCL 100 MG PO TABS
100.0000 mg | ORAL_TABLET | Freq: Every evening | ORAL | Status: DC | PRN
  Administered 2017-10-19 – 2017-10-20 (×2): 100 mg via ORAL
  Filled 2017-10-19 (×9): qty 1

## 2017-10-19 NOTE — Tx Team (Addendum)
Interdisciplinary Treatment and Diagnostic Plan Update  10/19/2017 Time of Session: 10:07 AM  Amy Mcclain MRN: [id number]  Principal Diagnosis: Schizophrenia, undifferentiated ([place])  Secondary Diagnoses: Principal Problem:   Schizophrenia, undifferentiated ([place])   Current Medications:  Current Facility-Administered Medications  Medication Dose Route Frequency Provider Last Rate Last Dose  . acetaminophen (TYLENOL) tablet 650 mg  650 mg Oral Q6H PRN Patrecia Pour, NP      . alum & mag hydroxide-simeth (MAALOX/MYLANTA) 200-200-20 MG/5ML suspension 30 mL  30 mL Oral Q6H PRN Patrecia Pour, NP      . diphenhydrAMINE (BENADRYL) capsule 50 mg  50 mg Oral Q6H PRN Pennelope Bracken, MD       Or  . diphenhydrAMINE (BENADRYL) injection 50 mg  50 mg Intramuscular Q6H PRN Pennelope Bracken, MD      . hydrOXYzine (ATARAX/VISTARIL) tablet 50 mg  50 mg Oral Q6H PRN Pennelope Bracken, MD   50 mg at 10/18/17 2133  . magnesium hydroxide (MILK OF MAGNESIA) suspension 30 mL  30 mL Oral Daily PRN Patrecia Pour, NP      . ondansetron [facility]) tablet 4 mg  4 mg Oral Q8H PRN Patrecia Pour, NP   4 mg at 10/14/17 2044  . [START ON 10/21/2017] paliperidone (INVEGA SUSTENNA) injection 156 mg  156 mg Intramuscular Once Maris Berger T, MD      . paliperidone (INVEGA) 24 hr tablet 6 mg  6 mg Oral Daily Pennelope Bracken, MD   6 mg at 10/19/17 0813  . sulfamethoxazole-trimethoprim (BACTRIM DS,SEPTRA DS) 800-160 MG per tablet 1 tablet  1 tablet Oral Q12H Derrill Center, NP   1 tablet at 10/19/17 0813  . traZODone (DESYREL) tablet 50 mg  50 mg Oral QHS,MR X 1 Derrill Center, NP   50 mg at 10/18/17 2132    PTA Medications: No medications prior to admission.    Patient Stressors: Marital or family conflict  Patient Strengths: Religious Affiliation  Treatment Modalities: Medication Management, Group therapy, Case management,  1 to 1 session with clinician,  Psychoeducation, Recreational therapy.   Physician Treatment Plan for Primary Diagnosis: Schizophrenia, undifferentiated ([place]) Long Term Goal(s): Improvement in symptoms so as ready for discharge  Short Term Goals: Ability to identify changes in lifestyle to reduce recurrence of condition will improve Ability to demonstrate self-control will improve Ability to maintain clinical measurements within normal limits will improve Ability to verbalize feelings will improve Compliance with prescribed medications will improve  Medication Management: Evaluate patient's response, side effects, and tolerance of medication regimen.  Therapeutic Interventions: 1 to 1 sessions, Unit Group sessions and Medication administration.  Evaluation of Outcomes: Not Met  Physician Treatment Plan for Secondary Diagnosis: Principal Problem:   Schizophrenia, undifferentiated ([place])   Long Term Goal(s): Improvement in symptoms so as ready for discharge  Short Term Goals: Ability to identify changes in lifestyle to reduce recurrence of condition will improve Ability to demonstrate self-control will improve Ability to maintain clinical measurements within normal limits will improve Ability to verbalize feelings will improve Compliance with prescribed medications will improve  Medication Management: Evaluate patient's response, side effects, and tolerance of medication regimen.  Therapeutic Interventions: 1 to 1 sessions, Unit Group sessions and Medication administration.  Evaluation of Outcomes: Not Met   RN Treatment Plan for Primary Diagnosis: Schizophrenia, undifferentiated ([place]) Long Term Goal(s): Knowledge of disease and therapeutic regimen to maintain health will improve  Short Term Goals: Ability to identify and develop effective  coping behaviors will improve and Compliance with prescribed medications will improve  Medication Management: RN will administer medications as ordered by provider, will  assess and evaluate patient's response and provide education to patient for prescribed medication. RN will report any adverse and/or side effects to prescribing provider.  Therapeutic Interventions: 1 on 1 counseling sessions, Psychoeducation, Medication administration, Evaluate responses to treatment, Monitor vital signs and CBGs as ordered, Perform/monitor CIWA, COWS, AIMS and Fall Risk screenings as ordered, Perform wound care treatments as ordered.  Evaluation of Outcomes: Progressing   LCSW Treatment Plan for Primary Diagnosis: Schizophrenia, undifferentiated ([place]) Long Term Goal(s): Safe transition to appropriate next level of care at discharge, Engage patient in therapeutic group addressing interpersonal concerns.  Short Term Goals: Engage patient in aftercare planning with referrals and resources  Therapeutic Interventions: Assess for all discharge needs, 1 to 1 time with Social worker, Explore available resources and support systems, Assess for adequacy in community support network, Educate family and significant other(s) on suicide prevention, Complete Psychosocial Assessment, Interpersonal group therapy.  Evaluation of Outcomes: Met  Return home, follow up outpt   Progress in Treatment: Attending groups: No Participating in groups: No Taking medication as prescribed: Yes Toleration medication: Yes, no side effects reported at this time Family/Significant other contact made: Yes Patient understands diagnosis:No  Limited insght Discussing patient identified problems/goals with staff: Yes Medical problems stabilized or resolved: Yes Denies suicidal/homicidal ideation: Yes Issues/concerns per patient self-inventory: None Other: N/A  New problem(s) identified: None identified at this time.   New Short Term/Long Term Goal(s): "I want help with lasers and smells. I'm ready to go home"  Discharge Plan or Barriers:   Reason for Continuation of Hospitalization:  Delusions   Depression Hallucinations Homicidal ideation  Medication stabilization   Estimated Length of Stay: 5/27  Attendees: Patient: Amy Mcclain 10/19/2017  10:07 AM  Physician: Maris Berger, MD 10/19/2017  10:07 AM  Nursing: Darrol Angel, RN 10/19/2017  10:07 AM  RN Care Manager: Lars Pinks, RN 10/19/2017  10:07 AM  Social Worker: Ripley Fraise 10/19/2017  10:07 AM  Recreational Therapist: Winfield Cunas 10/19/2017  10:07 AM  Other: Norberto Sorenson 10/19/2017  10:07 AM  Other:  10/19/2017  10:07 AM    Scribe for Treatment Team:  Roque Lias LCSW 10/19/2017 10:07 AM

## 2017-10-19 NOTE — Progress Notes (Signed)
[facility] MD Progress Note  10/19/2017 12:57 PM Amy Mcclain  MRN:  [id number]  Subjective: Gorgeous reports vis a [place] interpreter, "I need to be discharged. I missed my family. I feel alone in here. I was hand-cuffed to come to the hospital. It was not my intention to be here. My husband called the police because we had an argument about cleaning the bathroom for inspection. I was feeling very tired from fasting. I was also nursing our baby. My husband was upset with me for fasting while nursing a baby. He did not want me to fast any more. I'm the only one doing house work. It is tiring to clean the entire home by myself. I'm not depressed & I don't feel depressed. The reason I was brought to the hospital was because when the cops arrived, I tried to grab their badge to make sure they were who they said they were. I am not attending group sessions because I have back problems & I drink a lot water & I'm using the bathroom a lot. When am I going home".  Amy Mcclain is a 32 y/o [place] F with history of  Schizophrenia who was admitted from [facility] on IVC initiated by her husband with concern of worsening depression, agitation, paranoia, aggressive behavior towards her husband and children, making threat to throw her child out the window, VH of seeing lasers, olfactory hallucinations, and medication non-adherence for the past 6 months. Pt was medically cleared and then transferred to [facility] for additional treatment and stabilization. She was started on regimen of zyprexa  po BID. Pt has been reporting incremental improvement of her presenting symptoms.  Today, this assessment was conducted via a [place] interpreter Amy Mcclain). Amy Mcclain is seen, chart reviewed. The chart findings discussed with the treatment team. She presents very quiet & frail looking. She reports, "I need to be discharged". She says she misses her family & feels alone here. She adds that it was not her intention to be here if she had not  been hand-cuffed to be here by the cops. She says her husband called the cops because they got into an argument after he told her to not fast because she was nursing their baby. She also explained that her husband was upset with her because she did not clean their bathroom well during their home inspection. She presented her case as someone that was doing too much house work without help. Although, endorsing feeling tired prior to her hospitalization, she denies any hx of mental health issues when the chart reports indicated otherwise. She is not making any eye contacts during this assessment. She is not attending group sessions today. Amy Mcclain blamed her not attending group sessions on her back pains. She says she is drinking a lot of water & has to use the bathroom a lot. She denies symptoms of depression. She denies any SIHI, AVH, delusional thoughts or paranoia. We will continue current plan of care already in progress with her agreement.  Principal Problem: Schizophrenia, undifferentiated (HCC)  Diagnosis:   Patient Active Problem List   Diagnosis Date Noted  . Schizophrenia, undifferentiated (HCC) [F20.3] 10/14/2017  . Normal labor [O80, Z37.9] 04/20/2016  . Vaginal delivery [O80] 04/20/2016  . History of RPR test [Z92.89] 02/24/2016  . Language barrier affecting health care [Z78.9] 02/23/2016  . Mental disorder affecting pregnancy, antepartum [O99.340] 11/27/2015  . Supervision of high risk pregnancy, antepartum [O09.90] 11/12/2015  . Neonatal death, PTB @ 24wks [P96.9] 12-Nov-2015  . Involuntary commitment [  Z04.6]   . Severe recurrent major depression with psychotic features (HCC) [F33.3] 08/18/2015  . Schizophrenia, unspecified (HCC) [F20.9]    Total Time spent with patient: 15 minutes  Past Psychiatric History: See H&P  Past Medical History:  Past Medical History:  Diagnosis Date  . Anemia   . Depression   . Schizophrenia [facility])     Past Surgical History:  Procedure Laterality  Date  . NO PAST SURGERIES     Family History: History reviewed. No pertinent family history.  Family Psychiatric  History: See H&P  Social History:  Social History   Substance and Sexual Activity  Alcohol Use No  . Alcohol/week: 0.0 oz     Social History   Substance and Sexual Activity  Drug Use No    Social History   Socioeconomic History  . Marital status: Married    Spouse name: Not on file  . Number of children: Not on file  . Years of education: Not on file  . Highest education level: Not on file  Occupational History  . Not on file  Social Needs  . Financial resource strain: Not on file  . Food insecurity:    Worry: Not on file    Inability: Not on file  . Transportation needs:    Medical: Not on file    Non-medical: Not on file  Tobacco Use  . Smoking status: Never Smoker  . Smokeless tobacco: Never Used  Substance and Sexual Activity  . Alcohol use: No    Alcohol/week: 0.0 oz  . Drug use: No  . Sexual activity: Yes  Lifestyle  . Physical activity:    Days per week: Not on file    Minutes per session: Not on file  . Stress: Not on file  Relationships  . Social connections:    Talks on phone: Not on file    Gets together: Not on file    Attends religious service: Not on file    Active member of club or organization: Not on file    Attends meetings of clubs or organizations: Not on file    Relationship status: Not on file  Other Topics Concern  . Not on file  Social History Narrative  . Not on file   Additional Social History:   Sleep: Good  Appetite:  Good  Current Medications: Current Facility-Administered Medications  Medication Dose Route Frequency Provider Last Rate Last Dose  . acetaminophen (TYLENOL) tablet 650 mg  650 mg Oral Q6H PRN Charm Rings, NP      . alum & mag hydroxide-simeth (MAALOX/MYLANTA) 200-200-20 MG/5ML suspension 30 mL  30 mL Oral Q6H PRN Charm Rings, NP      . diphenhydrAMINE (BENADRYL) capsule 50 mg  50  mg Oral Q6H PRN Micheal Likens, MD       Or  . diphenhydrAMINE (BENADRYL) injection 50 mg  50 mg Intramuscular Q6H PRN Micheal Likens, MD      . hydrOXYzine (ATARAX/VISTARIL) tablet 50 mg  50 mg Oral Q6H PRN Micheal Likens, MD   50 mg at 10/18/17 2133  . magnesium hydroxide (MILK OF MAGNESIA) suspension 30 mL  30 mL Oral Daily PRN Charm Rings, NP      . ondansetron [facility]) tablet 4 mg  4 mg Oral Q8H PRN Charm Rings, NP   4 mg at 10/14/17 2044  . [START ON 10/21/2017] paliperidone (INVEGA SUSTENNA) injection 156 mg  156 mg Intramuscular Once Micheal Likens, MD      .  paliperidone (INVEGA) 24 hr tablet 6 mg  6 mg Oral Daily Micheal Likens, MD   6 mg at 10/19/17 0813  . sulfamethoxazole-trimethoprim (BACTRIM DS,SEPTRA DS) 800-160 MG per tablet 1 tablet  1 tablet Oral Q12H Oneta Rack, NP   1 tablet at 10/19/17 0813  . traZODone (DESYREL) tablet 50 mg  50 mg Oral QHS,MR X 1 Oneta Rack, NP   50 mg at 10/18/17 2132   Lab Results:  Results for orders placed or performed during the hospital encounter of 10/14/17 (from the past 48 hour(s))  Prolactin     Status: Abnormal   Collection Time: 10/18/17  6:25 AM  Result Value Ref Range   Prolactin 166.7 (H) 4.8 - 23.3 ng/mL    Comment: (NOTE) Performed At: [facility] [address] [place], [place] [id number] Jolene Schimke MD [id number] Performed at [facility], [address], [place], [place] [address]   Hemoglobin A1c     Status: None   Collection Time: 10/18/17  6:25 AM  Result Value Ref Range   Hgb A1c MFr Bld 4.8 4.8 - 5.6 %    Comment: (NOTE) Pre diabetes:          5.7%-6.4% Diabetes:              >6.4% Glycemic control for   <7.0% adults with diabetes    Mean Plasma Glucose 91.06 mg/dL    Comment: Performed at [facility], [address], [place], [place] [address]  TSH     Status: None   Collection Time: 10/18/17  6:25 AM   Result Value Ref Range   TSH 1.713 0.350 - 4.500 uIU/mL    Comment: Performed by a 3rd Generation assay with a functional sensitivity of <=0.01 uIU/mL. Performed at [facility], [address] [place], [place] [address]   Lipid panel     Status: Abnormal   Collection Time: 10/18/17  6:25 AM  Result Value Ref Range   Cholesterol 168 0 - 200 mg/dL   Triglycerides 60 <629 mg/dL   HDL 54 >52 mg/dL   Total CHOL/HDL Ratio 3.1 RATIO   VLDL 12 0 - 40 mg/dL   LDL Cholesterol 841 (H) 0 - 99 mg/dL    Comment:        Total Cholesterol/HDL:CHD Risk Coronary Heart Disease Risk Table                     Men   Women  1/2 Average Risk   3.4   3.3  Average Risk       5.0   4.4  2 X Average Risk   9.6   7.1  3 X Average Risk  23.4   11.0        Use the calculated Patient Ratio above and the CHD Risk Table to determine the patient's CHD Risk.        ATP III CLASSIFICATION (LDL):  <100     mg/dL   Optimal  324-401  mg/dL   Near or Above                    Optimal  130-159  mg/dL   Borderline  027-253  mg/dL   High  >664     mg/dL   Very High Performed at [facility], [address], [place], [place] [address]    Blood Alcohol level:  Lab Results  Component Value  Date   ETH <10 10/11/2017   ETH <5 08/18/2015   Metabolic Disorder Labs: Lab Results  Component Value Date   HGBA1C 4.8 10/18/2017   MPG 91.06 10/18/2017   Lab Results  Component Value Date   PROLACTIN 166.7 (H) 10/18/2017   Lab Results  Component Value Date   CHOL 168 10/18/2017   TRIG 60 10/18/2017   HDL 54 10/18/2017   CHOLHDL 3.1 10/18/2017   VLDL 12 10/18/2017   LDLCALC 102 (H) 10/18/2017   Physical Findings: AIMS:  , ,  ,  ,    CIWA:    COWS:     Musculoskeletal: Strength & Muscle Tone: within normal limits Gait & Station: normal Patient leans: N/A  Psychiatric Specialty Exam: Physical Exam  Nursing note and vitals reviewed.   Review of Systems   Constitutional: Negative for chills and fever.  Respiratory: Negative for cough and shortness of breath.   Cardiovascular: Negative for chest pain.  Gastrointestinal: Negative for abdominal pain, heartburn, nausea and vomiting.  Psychiatric/Behavioral: Positive for hallucinations. Negative for depression and suicidal ideas. The patient is not nervous/anxious and does not have insomnia.     Blood pressure 108/73, pulse (!) 113, temperature 98.7 F (37.1 C), temperature source Oral, resp. rate 18, SpO2 99 %, unknown if currently breastfeeding.There is no height or weight on file to calculate BMI.  General Appearance: Casual and Fairly Groomed  Eye Contact:  Good  Speech:  Clear and Coherent and Normal Rate  Volume:  Normal  Mood:  Anxious  Affect:  Appropriate, Congruent and Constricted  Thought Process:  Coherent and Goal Directed  Orientation:  Full (Time, Place, and Person)  Thought Content:  Hallucinations: Auditory Olfactory  Suicidal Thoughts:  No  Homicidal Thoughts:  No  Memory:  Immediate;   Fair Recent;   Fair Remote;   Fair  Judgement:  Fair  Insight:  Lacking  Psychomotor Activity:  Normal  Concentration:  Concentration: Fair  Recall:  [employer] of Knowledge:  Fair  Language:  Fair  Akathisia:  No  Handed:    AIMS (if indicated):     Assets:  Desire for Improvement Housing Physical Health Resilience Social Support  ADL's:  Intact  Cognition:  WNL  Sleep:  Number of Hours: 3.75   Treatment Plan Summary: Daily contact with patient to assess and evaluate symptoms and progress in treatment and Medication management   -Continue inpatient hospitalization.  -Will continue today 10/19/2017 plan as below except where it is noted.  -Schizophrenia             -Discontinued olanzapine  po BID on 10-18-17.   -Continue Invega  po qday   - Continue Invega Sustenna  IM (administered on 5/21) followed by Hinda Glatter Sustenna 156 mg q28 Days starting on  5/24  -Anxiety             -Continue Vistaril 50 mg po q6h prn anxiety  -Insomnia             -Continue trazodone  po qhs (may repeat x1)  -UTI             -Continue bactrim 800-160mg  take 1 tablet q12h for 5 days total.  -Encourage participation in groups and therapeutic milieu  -Disposition planning will be ongoing  Armandina Stammer, NP, pmhnp, fnp-BC. 10/19/2017, 12:57 PMPatient ID: Amy Mcclain, female   DOB: 27-Mar-1986, 32 y.o.   MRN: [id number]

## 2017-10-19 NOTE — Plan of Care (Signed)
D: Pt denies SI/HI/AVH. Pt is pleasant and cooperative. Pt continues to isolate and wants to leave.   A: Pt was offered support and encouragement. Pt was given scheduled medications. Pt was encourage to attend groups. Q 15 minute checks were done for safety.   R:Pt attends groups and interacts well with peers and staff. Pt is taking medication. Pt receptive to treatment and safety maintained on unit.   Problem: Education: Goal: Emotional status will improve Outcome: Progressing   Problem: Education: Goal: Mental status will improve Outcome: Progressing   Problem: Education: Goal: Verbalization of understanding the information provided will improve Outcome: Progressing   Problem: Safety: Goal: Periods of time without injury will increase Outcome: Progressing

## 2017-10-19 NOTE — BHH Group Notes (Signed)
LCSW Group Therapy Note  10/19/2017 1:15pm  Type of Therapy/Topic:  Group Therapy:  Balance in Life  Participation Level:  Did Not Attend  Description of Group:    This group will address the concept of balance and how it feels and looks when one is unbalanced. Patients will be encouraged to process areas in their lives that are out of balance and identify reasons for remaining unbalanced. Facilitators will guide patients in utilizing problem-solving interventions to address and correct the stressor making their life unbalanced. Understanding and applying boundaries will be explored and addressed for obtaining and maintaining a balanced life. Patients will be encouraged to explore ways to assertively make their unbalanced needs known to significant others in their lives, using other group members and facilitator for support and feedback.  Therapeutic Goals: 1. Patient will identify two or more emotions or situations they have that consume much of in their lives. 2. Patient will identify signs/triggers that life has become out of balance:  3. Patient will identify two ways to set boundaries in order to achieve balance in their lives:  4. Patient will demonstrate ability to communicate their needs through discussion and/or role plays  Summary of Patient Progress:      Therapeutic Modalities:   Cognitive Behavioral Therapy Solution-Focused Therapy Assertiveness Training  [name], LCSW 10/19/2017 1:14 PM

## 2017-10-19 NOTE — Progress Notes (Signed)
DAR NOTE: Patient presents with anxious affect and depressed mood.  Pt stated she is worried about her child and want to be discharged because she need to know if her child is safe. Pt reports good night sleep, good appetite, normal energy, and poor concentration. Denies pain, auditory and visual hallucinations.  Rates depression at 0, hopelessness at 0, and anxiety at 0.  Maintained on routine safety checks.  Medications given as prescribed.  Support and encouragement offered as needed.  Attended group and participated.  States goal for today is "my goal to get out of here.".  Will continue to monitor.

## 2017-10-20 DIAGNOSIS — Z9114 Patient's other noncompliance with medication regimen: Secondary | ICD-10-CM

## 2017-10-20 DIAGNOSIS — R451 Restlessness and agitation: Secondary | ICD-10-CM

## 2017-10-20 NOTE — Progress Notes (Signed)
Patient denies SI, HI and AVH this shift.  Patient stated that she is ready to go home.  Patient has been compliant with medications and attended groups.   Assess patient for safety, offer medications as prescribed, engage patient in 1:1 staff talks.   Patient able to contract for safety.

## 2017-10-20 NOTE — [facility] Group Notes (Signed)
[facility] [employer] Group Therapy  10/20/2017 , 3:45 PM    Type of Therapy:  M[employer] Presentation  Participation Level:  Active  Participation Quality:  Attentive  Affect:  Blunted  Cognitive:  Oriented  Insight:  Limited  Engagement in Therapy:  Engaged  Modes of Intervention:  Discussion, Education and Socialization  Summary of Progress/Problems:  [name]  from [employer] came to present her recovery story, encourage group  members to share something about their story, and present information about the MHA.    [name] 10/20/2017 , 3:45 PM

## 2017-10-20 NOTE — B[facility] Suicide Risk Assessment (Signed)
[facility] INPATIENT:  Family/Significant Other Suicide Prevention Education  Suicide Prevention Education:  Education Completed; No one has been identified by the patient as the family member/significant other with whom the patient will be residing, and identified as the person(s) who will aid the patient in the event of a mental health crisis (suicidal ideations/suicide attempt).  With written consent from the patient, the family member/significant other has been provided the following suicide prevention education, prior to the and/or following the discharge of the patient.  The suicide prevention education provided includes the following:  Suicide risk factors  Suicide prevention and interventions  National Suicide Hotline telephone number  [facility] assessment telephone number  [facility] Emergency Assistance 911  [facility] and/or Residential Mobile Crisis Unit telephone number  Request made of family/significant other to:  Remove weapons (e.g., guns, rifles, knives), all items previously/currently identified as safety concern.    Remove drugs/medications (over-the-counter, prescriptions, illicit drugs), all items previously/currently identified as a safety concern.  The family member/significant other verbalizes understanding of the suicide prevention education information provided.  The family member/significant other agrees to remove the items of safety concern listed above. The patient did not endorse SI at the time of admission, nor did the patient c/o SI during the stay here.  SPE not required.  Amy Mcclain 10/20/2017, 3:47 PM

## 2017-10-20 NOTE — Progress Notes (Signed)
Adult Psychoeducational Group Note  Date:  10/20/2017 Time:  8:46 PM  Group Topic/Focus:  Wrap-Up Group:   The focus of this group is to help patients review their daily goal of treatment and discuss progress on daily workbooks.  Participation Level:  Active  Participation Quality:  Appropriate  Affect:  Appropriate  Cognitive:  Appropriate  Insight: Appropriate  Engagement in Group:  Engaged  Modes of Intervention:  Discussion  Additional Comments: The patient expressed that she rates today a 9.The patient also said that she attended group.  Octavio Manns 10/20/2017, 8:46 PM

## 2017-10-20 NOTE — Progress Notes (Signed)
[facility] MD Progress Note  10/20/2017 1:39 PM Casandra Dallaire  MRN:  [id number]  Subjective: Sherlonda reports vis a [place] interpreter, "I'm doing well. I'm sleeping well at night. I'm doing well on the medicines. No side effects. I did go to group sessions last night. We talked about settings goals. And my goal is to get out of here, rejoin my family. I will take care of myself & my family. I'm eating well. I normally do not go to breakfast in the mornings because I feel tired upon waking up. But, I do go to the cafeteria for lunch & dinner. The staff brings me breakfast".  Conception Lepage is a 32 y/o [place] F with history of  Schizophrenia who was [facility] from [facility] on IVC initiated by her husband with concern of worsening depression, agitation, paranoia, aggressive behavior towards her husband and children, making threat to throw her child out the window, VH of seeing lasers, olfactory hallucinations, and medication non-adherence for the past 6 months. Pt was medically cleared and then transferred to [facility] for additional treatment and stabilization. She was started on regimen of zyprexa  po BID. Pt has been reporting incremental improvement of her presenting symptoms.  Today, this assessment was conducted via a [place] interpreter Designer, jewellery [name]). Verline is seen, chart reviewed. The chart findings discussed with the treatment team. She presents very quiet & frail looking. She reports, "I'm doing well. I'm sleeping well & I'm doing well on the medicines. She denies any side effects.  She is making some eye contacts today during this assessment. She is not attending morning group sessions or breakfast because she says she feels tired upon waking up in the morning. However, she says she does go to the cafeteria with rest of the patients to eat lunch & supper. She says she attends the evening group sessions. She denies symptoms of depression. She denies any SIHI, AVH, delusional thoughts or paranoia. We will  continue current plan of care already in progress with her agreement.  Principal Problem: Schizophrenia, undifferentiated (HCC)  Diagnosis:   Patient Active Problem List   Diagnosis Date Noted  . Schizophrenia, undifferentiated (HCC) [F20.3] 10/14/2017  . Normal labor [O80, Z37.9] 04/20/2016  . Vaginal delivery [O80] 04/20/2016  . History of RPR test [Z92.89] 02/24/2016  . Language barrier affecting health care [Z78.9] 02/23/2016  . Mental disorder affecting pregnancy, antepartum [O99.340] 11/27/2015  . Supervision of high risk pregnancy, antepartum [O09.90] 11/24/2015  . Neonatal death, PTB @ 24wks [P96.9] 11/24/2015  . Involuntary commitment [Z04.6]   . Severe recurrent major depression with psychotic features (HCC) [F33.3] 08/18/2015  . Schizophrenia, unspecified (HCC) [F20.9]    Total Time spent with patient: 15 minutes  Past Psychiatric History: See H&P  Past Medical History:  Past Medical History:  Diagnosis Date  . Anemia   . Depression   . Schizophrenia [facility])     Past Surgical History:  Procedure Laterality Date  . NO PAST SURGERIES     Family History: History reviewed. No pertinent family history.  Family Psychiatric  History: See H&P  Social History:  Social History   Substance and Sexual Activity  Alcohol Use No  . Alcohol/week: 0.0 oz     Social History   Substance and Sexual Activity  Drug Use No    Social History   Socioeconomic History  . Marital status: Married    Spouse name: Not on file  . Number of children: Not on file  . Years of education: Not on  file  . Highest education level: Not on file  Occupational History  . Not on file  Social Needs  . Financial resource strain: Not on file  . Food insecurity:    Worry: Not on file    Inability: Not on file  . Transportation needs:    Medical: Not on file    Non-medical: Not on file  Tobacco Use  . Smoking status: Never Smoker  . Smokeless tobacco: Never Used  Substance and Sexual  Activity  . Alcohol use: No    Alcohol/week: 0.0 oz  . Drug use: No  . Sexual activity: Yes  Lifestyle  . Physical activity:    Days per week: Not on file    Minutes per session: Not on file  . Stress: Not on file  Relationships  . Social connections:    Talks on phone: Not on file    Gets together: Not on file    Attends religious service: Not on file    Active member of club or organization: Not on file    Attends meetings of clubs or organizations: Not on file    Relationship status: Not on file  Other Topics Concern  . Not on file  Social History Narrative  . Not on file   Additional Social History:   Sleep: Good  Appetite:  Good  Current Medications: Current Facility-Administered Medications  Medication Dose Route Frequency Provider Last Rate Last Dose  . acetaminophen (TYLENOL) tablet 650 mg  650 mg Oral Q6H PRN Charm Rings, NP      . alum & mag hydroxide-simeth (MAALOX/MYLANTA) 200-200-20 MG/5ML suspension 30 mL  30 mL Oral Q6H PRN Charm Rings, NP      . diphenhydrAMINE (BENADRYL) capsule 50 mg  50 mg Oral Q6H PRN Micheal Likens, MD       Or  . diphenhydrAMINE (BENADRYL) injection 50 mg  50 mg Intramuscular Q6H PRN Micheal Likens, MD      . hydrOXYzine (ATARAX/VISTARIL) tablet 50 mg  50 mg Oral Q6H PRN Micheal Likens, MD   50 mg at 10/19/17 2107  . magnesium hydroxide (MILK OF MAGNESIA) suspension 30 mL  30 mL Oral Daily PRN Charm Rings, NP      . ondansetron [facility]) tablet 4 mg  4 mg Oral Q8H PRN Charm Rings, NP   4 mg at 10/14/17 2044  . [START ON 10/21/2017] paliperidone (INVEGA SUSTENNA) injection 156 mg  156 mg Intramuscular Once Jolyne Loa T, MD      . paliperidone (INVEGA) 24 hr tablet 6 mg  6 mg Oral Daily Micheal Likens, MD   6 mg at 10/20/17 0814  . traZODone (DESYREL) tablet 100 mg  100 mg Oral QHS,MR X 1 Kerry Hough, PA-C   100 mg at 10/19/17 2107   Lab Results:  No results found  for this or any previous visit (from the past 48 hour(s)). Blood Alcohol level:  Lab Results  Component Value Date   ETH <10 10/11/2017   ETH <5 08/18/2015   Metabolic Disorder Labs: Lab Results  Component Value Date   HGBA1C 4.8 10/18/2017   MPG 91.06 10/18/2017   Lab Results  Component Value Date   PROLACTIN 166.7 (H) 10/18/2017   Lab Results  Component Value Date   CHOL 168 10/18/2017   TRIG 60 10/18/2017   HDL 54 10/18/2017   CHOLHDL 3.1 10/18/2017   VLDL 12 10/18/2017   LDLCALC 102 (H) 10/18/2017  Physical Findings: AIMS:  , ,  ,  ,    CIWA:    COWS:     Musculoskeletal: Strength & Muscle Tone: within normal limits Gait & Station: normal Patient leans: N/A  Psychiatric Specialty Exam: Physical Exam  Nursing note and vitals reviewed.   Review of Systems  Constitutional: Negative for chills and fever.  Respiratory: Negative for cough and shortness of breath.   Cardiovascular: Negative for chest pain.  Gastrointestinal: Negative for abdominal pain, heartburn, nausea and vomiting.  Psychiatric/Behavioral: Positive for hallucinations. Negative for depression and suicidal ideas. The patient is not nervous/anxious and does not have insomnia.     Blood pressure 103/74, pulse (!) 130, temperature 98.6 F (37 C), temperature source Oral, resp. rate 18, SpO2 99 %, unknown if currently breastfeeding.There is no height or weight on file to calculate BMI.  General Appearance: Casual and Fairly Groomed  Eye Contact:  Good  Speech:  Clear and Coherent and Normal Rate  Volume:  Normal  Mood:  Anxious  Affect:  Appropriate, Congruent and Constricted  Thought Process:  Coherent and Goal Directed  Orientation:  Full (Time, Place, and Person)  Thought Content:  Hallucinations: Auditory Olfactory  Suicidal Thoughts:  No  Homicidal Thoughts:  No  Memory:  Immediate;   Fair Recent;   Fair Remote;   Fair  Judgement:  Fair  Insight:  Lacking  Psychomotor Activity:   Normal  Concentration:  Concentration: Fair  Recall:  [employer] of Knowledge:  Fair  Language:  Fair  Akathisia:  No  Handed:    AIMS (if indicated):     Assets:  Desire for Improvement Housing Physical Health Resilience Social Support  ADL's:  Intact  Cognition:  WNL  Sleep:  Number of Hours: 5.5   Treatment Plan Summary: Daily contact with patient to assess and evaluate symptoms and progress in treatment and Medication management   -Continue inpatient hospitalization.  -Will continue today 10/20/2017 plan as below except where it is noted.  -Schizophrenia             -Discontinued olanzapine  po BID on 10-18-17.   -Continue Invega  po qday   - Continue Invega Sustenna  IM (administered on 5/21) followed by Hinda Glatter Sustenna 156 mg q28 Days starting on 5/24  -Anxiety             -Continue Vistaril 50 mg po q6h prn anxiety  -Insomnia             -Continue trazodone  po qhs (may repeat x1)  -UTI             -Continue bactrim 800-160mg  take 1 tablet q12h for 5 days total.  -Encourage participation in groups and therapeutic milieu  -Disposition planning will be ongoing  Armandina Stammer, NP, pmhnp, fnp-BC. 10/20/2017, 1:39 PMPatient ID: [name], female   DOB: [birth year], 32 y.o.   MRN: [id number] Patient ID: Nate Perri, female   DOB: 1986-05-28, 32 y.o.   MRN: [id number]

## 2017-10-20 NOTE — Plan of Care (Signed)
D: Pt denies SI/HI/AVH. Pt is pleasant and cooperative. Pt stated she was ready, pt visibly happier affect. Pt continues to keep to herself on the unit , but pt appears less anxious this evening.   A: Pt was offered support and encouragement. Pt was given scheduled medications. Pt was encourage to attend groups. Q 15 minute checks were done for safety.   R:Pt attends groups and interacts well with peers and staff. Pt is taking medication. Pt has no complaints.Pt receptive to treatment and safety maintained on unit.   Problem: Education: Goal: Emotional status will improve Outcome: Progressing   Problem: Education: Goal: Mental status will improve Outcome: Progressing   Problem: Activity: Goal: Sleeping patterns will improve Outcome: Progressing   Problem: Health Behavior/Discharge Planning: Goal: Compliance with treatment plan for underlying cause of condition will improve Outcome: Progressing   Problem: Safety: Goal: Periods of time without injury will increase Outcome: Progressing

## 2017-10-20 NOTE — Progress Notes (Signed)
  [facility] Adult Case Management Discharge Plan :  Will you be returning to the same living situation after discharge:  Yes,  home At discharge, do you have transportation home?: Yes,  husband Do you have the ability to pay for your medications: Yes,  mental health  Release of information consent forms completed and in the chart;  Patient's signature needed at discharge.  Patient to Follow up at: Follow-up Information    [facility] Follow up on 10/25/2017.   Specialty:  Behavioral Health Why:  Tuesday at 8:30AM for your hospital follow up appointment with Gloriajean Dell  Bring along your ID and hospital d/c paperwork Contact information: [address] [place] [address] [phone]           Next level of care provider has access to [facility] Link:no  Safety Planning and Suicide Prevention discussed: Yes,  yes     Has patient been referred to the Quitline?: N/A patient is not a smoker  Patient has been referred for addiction treatment: N/A  [name], LCSW 10/20/2017, 3:48 PM

## 2017-10-21 MED ORDER — PALIPERIDONE ER 6 MG PO TB24: 6.0000 mg | ORAL_TABLET | Freq: Every day | ORAL | 0 refills | Status: DC

## 2017-10-21 MED ORDER — HYDROXYZINE HCL 50 MG PO TABS: 50.0000 mg | ORAL_TABLET | Freq: Four times a day (QID) | ORAL | 0 refills | Status: DC | PRN

## 2017-10-21 MED ORDER — TRAZODONE HCL 100 MG PO TABS
100.0000 mg | ORAL_TABLET | Freq: Every evening | ORAL | Status: DC | PRN
  Filled 2017-10-21: qty 7

## 2017-10-21 MED ORDER — PALIPERIDONE PALMITATE ER 156 MG/ML IM SUSY: 156.0000 mg | PREFILLED_SYRINGE | INTRAMUSCULAR | 0 refills | Status: DC

## 2017-10-21 MED ORDER — TRAZODONE HCL 100 MG PO TABS: 100.0000 mg | ORAL_TABLET | Freq: Every evening | ORAL | 0 refills | Status: DC | PRN

## 2017-10-21 NOTE — Discharge Summary (Addendum)
Physician Discharge Summary Note  Patient:  Amy Mcclain is an 32 y.o., female  MRN:  [id number]  DOB:  Oct 09, 1985  Patient phone:  [phone] (home)   Patient address:   [address] [place] [address],   Total Time spent with patient: Greater than 30 minutes  Date of Admission:  10/14/2017  Date of Discharge: 10-21-17  Reason for Admission: Worsening depression, agitation, paranoia, aggressive behavior towards her husband and children, making threat to throw her child out the window, VH of seeing lasers, olfactory hallucinations, and medication non-adherence for the past 6 months.  Principal Problem: Schizophrenia, undifferentiated (HCC)  Discharge Diagnoses: Patient Active Problem List   Diagnosis Date Noted  . Schizophrenia, undifferentiated (HCC) [F20.3] 10/14/2017  . Normal labor [O80, Z37.9] 04/20/2016  . Vaginal delivery [O80] 04/20/2016  . History of RPR test [Z92.89] 02/24/2016  . Language barrier affecting health care [Z78.9] 02/23/2016  . Mental disorder affecting pregnancy, antepartum [O99.340] 11/27/2015  . Supervision of high risk pregnancy, antepartum [O09.90] 2015/11/08  . Neonatal death, PTB @ 24wks [P96.9] Nov 08, 2015  . Involuntary commitment [Z04.6]   . Severe recurrent major depression with psychotic features (HCC) [F33.3] 08/18/2015  . Schizophrenia, unspecified (HCC) [F20.9]    Past Psychiatric History: Schizophrenia  Past Medical History:  Past Medical History:  Diagnosis Date  . Anemia   . Depression   . Schizophrenia [facility])     Past Surgical History:  Procedure Laterality Date  . NO PAST SURGERIES     Family History: History reviewed. No pertinent family history.  Family Psychiatric  History: See H&P  Social History:  Social History   Substance and Sexual Activity  Alcohol Use No  . Alcohol/week: 0.0 oz     Social History   Substance and Sexual Activity  Drug Use No    Social History   Socioeconomic History  .  Marital status: Married    Spouse name: Not on file  . Number of children: Not on file  . Years of education: Not on file  . Highest education level: Not on file  Occupational History  . Not on file  Social Needs  . Financial resource strain: Not on file  . Food insecurity:    Worry: Not on file    Inability: Not on file  . Transportation needs:    Medical: Not on file    Non-medical: Not on file  Tobacco Use  . Smoking status: Never Smoker  . Smokeless tobacco: Never Used  Substance and Sexual Activity  . Alcohol use: No    Alcohol/week: 0.0 oz  . Drug use: No  . Sexual activity: Yes  Lifestyle  . Physical activity:    Days per week: Not on file    Minutes per session: Not on file  . Stress: Not on file  Relationships  . Social connections:    Talks on phone: Not on file    Gets together: Not on file    Attends religious service: Not on file    Active member of club or organization: Not on file    Attends meetings of clubs or organizations: Not on file    Relationship status: Not on file  Other Topics Concern  . Not on file  Social History Narrative  . Not on file   Hospital Course: (Per Md's Discharge SRA): [name] is a 32 y/o [place] F with history of Schizophrenia who was [facility] from [facility] on IVC initiated by her husband with concern of  worsening depression, agitation, paranoia, aggressive behavior towards her husband and children, making threat to throw her child out the window, VH of seeing lasers, olfactory hallucinations, and medication non-adherence for the past 6 months. Pt was medically cleared and then transferred to [facility] for additional treatment and stabilization. She was started on regimen of zyprexa  po BID, but her husband had expressed concern about pt's adherence after discharge, so she was changed to [place] with first dose administered on 5/21 and booster dose scheduled for 5/24. Pt has been tolerating this medication without  difficulty.Pt has been reporting incremental improvement of her presenting symptoms.  Besides the use of Invega Sustenna 156 mg/ML for mood control, [name] was also medicated & discharged on; Invega tablets 6 mg for mood control, Hydroxyzine 50 mg prn for anxiety & Trazodone 100 mg for insomnia. She was enrolled & rarely participated in the group counseling sessions being offered & held on this unit. She expressed her excuses for not participating on being tired & having to use the bathroom a lot because she was drinking a lot of water. Other than urinary tract infection of which she received an antibiotic therapy for. [name] presented no other significant health issues that required treatment & or monitoring. She tolerated her treatment regimen without any adverse effects or reactions reported.  Upon evaluation today, interview was completed with assistance of Arabic interpreter Designer, jewellery [name]). Ptshares, "I'm good. Just a little sleepy." She denies any specific concerns. She is sleeping well. Her appetite is good. She denies SI/HI/AH/VH. She reports some olfactory hallucinations of smelling "burning flesh" but it is not bothersome or distracting to the patient. She is tolerating her medications without difficulty or side effects. She is in agreement to continue her current treatment regimen without changes. She plans to follow up at [facility]. Pt's husband was called for collateral information, and he had no safety concerns with pt's discharge. She was able to engage in safety planning including plan to return to [facility] or contact emergency services if she feels unable to maintain her own safety or the safety of others. Pt had no further questions, comments, or concerns.  Upon discharge, [name] presents mentally & medically stable. She will continue mental health care on an outpatient basis as noted below. She is provided with all the necessary information needed to make this appointment without problems.  She received from the [facility] pharmacy, a 7 days worth supply samples of her [facility] discharge medications. She left [facility] with all personal belongings in no apparent distress. Transportation per her husband.  Physical Findings: AIMS:  , ,  ,  ,    CIWA:    COWS:     Musculoskeletal: Strength & Muscle Tone: within normal limits Gait & Station: normal Patient leans: N/A  Psychiatric Specialty Exam: Physical Exam  Constitutional: She is oriented to person, place, and time. She appears well-developed.  HENT:  Head: Normocephalic.  Eyes: Pupils are equal, round, and reactive to light.  Neck: Normal range of motion.  Cardiovascular: Normal rate.  Respiratory: Effort normal.  GI: Soft.  Genitourinary:  Genitourinary Comments: Deferred  Musculoskeletal: Normal range of motion.  Neurological: She is alert and oriented to person, place, and time.  Skin: Skin is warm.    Review of Systems  Constitutional: Negative.   HENT: Negative.   Eyes: Negative.   Respiratory: Negative.   Cardiovascular: Negative.   Gastrointestinal: Negative.   Genitourinary: Negative.   Musculoskeletal: Negative.   Skin: Negative.   Neurological: Negative.  Endo/Heme/Allergies: Negative.   Psychiatric/Behavioral: Positive for hallucinations (Hx. psychosis (Stabilized with medication prior to discharge)). Negative for depression, memory loss, substance abuse and suicidal ideas. The patient has insomnia (Stabilized with medication prior to discharge). The patient is not nervous/anxious.     Blood pressure 108/73, pulse (!) 110, temperature 98.7 F (37.1 C), temperature source Oral, resp. rate 18, SpO2 99 %, unknown if currently breastfeeding.There is no height or weight on file to calculate BMI.  See Md's SRA   Has this patient used any form of tobacco in the last 30 days? (Cigarettes, Smokeless Tobacco, Cigars, and/or Pipes):N/A  Blood Alcohol level:  Lab Results  Component Value Date   ETH <10 10/11/2017    ETH <5 08/18/2015   Metabolic Disorder Labs:  Lab Results  Component Value Date   HGBA1C 4.8 10/18/2017   MPG 91.06 10/18/2017   Lab Results  Component Value Date   PROLACTIN 166.7 (H) 10/18/2017   Lab Results  Component Value Date   CHOL 168 10/18/2017   TRIG 60 10/18/2017   HDL 54 10/18/2017   CHOLHDL 3.1 10/18/2017   VLDL 12 10/18/2017   LDLCALC 102 (H) 10/18/2017   See Psychiatric Specialty Exam and Suicide Risk Assessment completed by Attending Physician prior to discharge.  Discharge destination:  Home  Is patient on multiple antipsychotic therapies at discharge:  No   Has Patient had three or more failed trials of antipsychotic monotherapy by history:  No  Recommended Plan for Multiple Antipsychotic Therapies: NA  Allergies as of 10/21/2017      Reactions   Ceftin [cefuroxime Axetil] Rash   bruising      Medication List    TAKE these medications     Indication  hydrOXYzine 50 MG tablet Commonly known as:  ATARAX/VISTARIL Take 1 tablet (50 mg total) by mouth every 6 (six) hours as needed for anxiety.  Indication:  Feeling Anxious   paliperidone 156 MG/ML Susy injection Commonly known as:  INVEGA SUSTENNA Inject 1 mL (156 mg total) into the muscle every 30 (thirty) days. (Due on 11-21-17): For mood control Start taking on:  11/21/2017  Indication:  Mood control   paliperidone 6 MG 24 hr tablet Commonly known as:  INVEGA Take 1 tablet (6 mg total) by mouth daily. For mood control Start taking on:  10/22/2017  Indication:  Mood control   traZODone 100 MG tablet Commonly known as:  DESYREL Take 1 tablet (100 mg total) by mouth at bedtime as needed for sleep.  Indication:  Trouble Sleeping      Follow-up Information    [facility] Follow up on 10/25/2017.   Specialty:  Behavioral Health Why:  Tuesday at 8:30AM for your hospital follow up appointment with Gloriajean Dell  Bring along your ID and hospital d/c paperwork Contact information: [address] [place] [address] [phone]          Follow-up recommendations: Activity:  As tolerated Diet: As recommended by your primary care doctor. Keep all scheduled follow-up appointments as recommended.  Comments: Patient is instructed prior to discharge to: Take all medications as prescribed by his/her mental healthcare provider. Report any adverse effects and or reactions from the medicines to his/her outpatient provider promptly. Patient has been instructed & cautioned: To not engage in alcohol and or illegal drug use while on prescription medicines. In the event of worsening symptoms, patient is instructed to call the crisis hotline, 911 and or go to the nearest ED for appropriate evaluation and  treatment of symptoms. To follow-up with his/her primary care provider for your other medical issues, concerns and or health care needs.   Signed: Armandina Stammer, NP, PMHNP, FNP-BC 10/21/2017, 10:16 AM   Patient seen, Suicide Assessment Completed.  Disposition Plan Reviewed

## 2017-10-21 NOTE — Progress Notes (Signed)
Patient discharged to lobby. Patient was stable and appreciative at that time. All papers, samples and prescriptions were given and valuables returned. Verbal understanding expressed. Denies SI/HI and A/VH. Patient given opportunity to express concerns and ask questions.  

## 2017-10-21 NOTE — BHH Suicide Risk Assessment (Signed)
[facility] Discharge Suicide Risk Assessment   Principal Problem: Schizophrenia, undifferentiated (HCC) Discharge Diagnoses:  Patient Active Problem List   Diagnosis Date Noted  . Schizophrenia, undifferentiated (HCC) [F20.3] 10/14/2017  . Normal labor [O80, Z37.9] 04/20/2016  . Vaginal delivery [O80] 04/20/2016  . History of RPR test [Z92.89] 02/24/2016  . Language barrier affecting health care [Z78.9] 02/23/2016  . Mental disorder affecting pregnancy, antepartum [O99.340] 11/27/2015  . Supervision of high risk pregnancy, antepartum [O09.90] 11/19/15  . Neonatal death, PTB @ 24wks [P96.9] November 19, 2015  . Involuntary commitment [Z04.6]   . Severe recurrent major depression with psychotic features (HCC) [F33.3] 08/18/2015  . Schizophrenia, unspecified (HCC) [F20.9]     Total Time spent with patient: 30 minutes  Musculoskeletal: Strength & Muscle Tone: within normal limits Gait & Station: normal Patient leans: N/A  Psychiatric Specialty Exam: Review of Systems  Constitutional: Negative for chills and fever.  Respiratory: Negative for cough and shortness of breath.   Cardiovascular: Negative for chest pain.  Gastrointestinal: Negative for abdominal pain, heartburn, nausea and vomiting.  Psychiatric/Behavioral: Negative for depression, hallucinations and suicidal ideas. The patient is not nervous/anxious and does not have insomnia.     Blood pressure 108/73, pulse (!) 110, temperature 98.7 F (37.1 C), temperature source Oral, resp. rate 18, SpO2 99 %, unknown if currently breastfeeding.There is no height or weight on file to calculate BMI.  General Appearance: Casual and Fairly Groomed  Patent attorney::  Good  Speech:  Clear and Coherent and Normal Rate  Volume:  Normal  Mood:  Euthymic  Affect:  Appropriate, Congruent and Flat  Thought Process:  Coherent and Goal Directed  Orientation:  Full (Time, Place, and Person)  Thought Content:  Hallucinations: Olfactory  Suicidal Thoughts:   No  Homicidal Thoughts:  No  Memory:  Immediate;   Good Recent;   Good Remote;   Good  Judgement:  Fair  Insight:  Lacking  Psychomotor Activity:  Normal  Concentration:  Fair  Recall:  [employer] of Knowledge:Fair  Language: Fair  Akathisia:  No  Handed:    AIMS (if indicated):     Assets:  Communication Skills Resilience Social Support  Sleep:  Number of Hours: 5.5  Cognition: WNL  ADL's:  Intact   Mental Status Per Nursing Assessment::   On Admission:  Plan to harm others  Demographic Factors:  NA  Loss Factors: NA  Historical Factors: NA  Risk Reduction Factors:   Responsible for children under 45 years of age, Sense of responsibility to family, Living with another person, especially a relative, Positive social support, Positive therapeutic relationship and Positive coping skills or problem solving skills  Continued Clinical Symptoms:  Schizophrenia:   Less than 62 years old  Cognitive Features That Contribute To Risk:  None    Suicide Risk:  Minimal: No identifiable suicidal ideation.  Patients presenting with no risk factors but with morbid ruminations; may be classified as minimal risk based on the severity of the depressive symptoms  Follow-up Information    [facility] Follow up on 10/25/2017.   Specialty:  Behavioral Health Why:  Tuesday at 8:30AM for your hospital follow up appointment with Gloriajean Dell  Bring along your ID and hospital d/c paperwork Contact information: [address] [place] [address] [phone]         Subjective Data:   Amy Mcclain is a 32 y/o [place] F with history of Schizophrenia who was admitted from [facility] on IVC initiated by her husband with concern  of worsening depression, agitation, paranoia, aggressive behavior towards her husband and children, making threat to throw her child out the window, VH of seeing lasers, olfactory hallucinations, and medication non-adherence for the past 6 months. Pt was medically cleared and  then transferred to [facility] for additional treatment and stabilization. She was started on regimen of zyprexa  po BID, but her husband had expressed concern about pt's adherence after discharge, so she was changed to [place] with first dose administered on 5/21 and booster dose scheduled for 5/24. Pt has been tolerating this medication without difficulty. Pt has been reporting incremental improvement of her presenting symptoms.  Upon evaluation today, interview was completed with assistance of Arabic interpreter Designer, jewellery [name]). Pt shares, "I'm good. Just a little sleepy." She denies any specific concerns. She is sleeping well. Her appetite is good. She denies SI/HI/AH/VH. She reports some olfactory hallucinations of smelling "burning flesh" but it is not bothersome or distracting to the patient. She is tolerating her medications without difficulty or side effects. She is in agreement to continue her current treatment regimen without changes. She plans to follow up at [facility]. Pt's husband was called for collateral information, and he had no safety concerns with pt's discharge. She was able to engage in safety planning including plan to return to [facility] or contact emergency services if she feels unable to maintain her own safety or the safety of others. Pt had no further questions, comments, or concerns.   Plan Of Care/Follow-up recommendations:   -Discharge to outpatient level of care  -Schizophrenia  -Continue Invega  po qday             - Continue Gean Birchwood. Sustenna  IM administered on 5/21 and followed by Hinda Glatter Sustenna 156 mg q28 Days starting on 5/24  -Anxiety -Continue Vistaril 50 mg po q6h prn anxiety  -Insomnia -Continue trazodone  po qhs  Activity:  as tolerated Diet:  normal Tests:  NA Other:  see above for DC plan  Micheal Likens, MD 10/21/2017, 10:59 AM

## 2018-05-19 IMAGING — US US MFM OB TRANSVAGINAL
1 series · 15 of 23 positions shown · non-contrast
Comparison: none

[Series 1: us mfm ob transvaginal · 23 acquisitions, 15 frames shown]
[im 1/23]
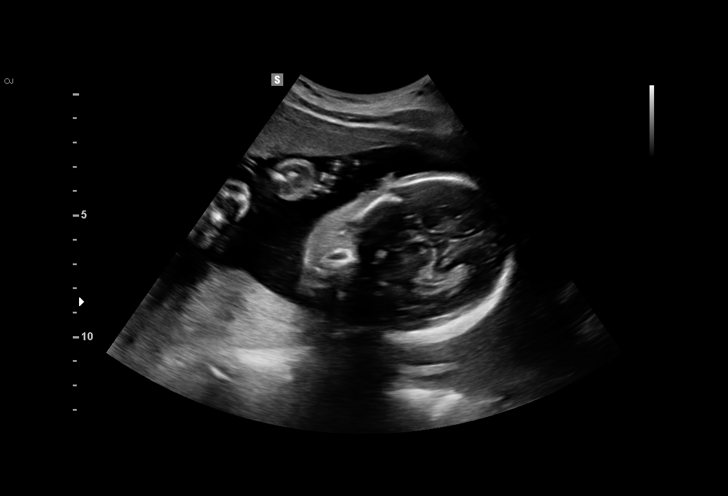
[im 3/23]
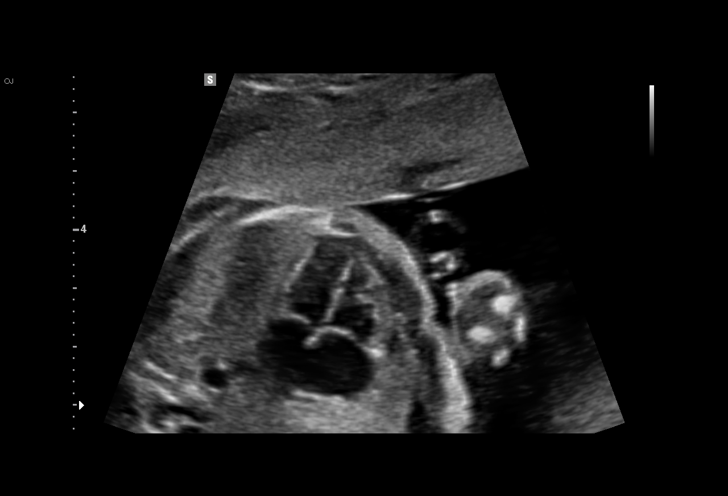
[im 4/23]
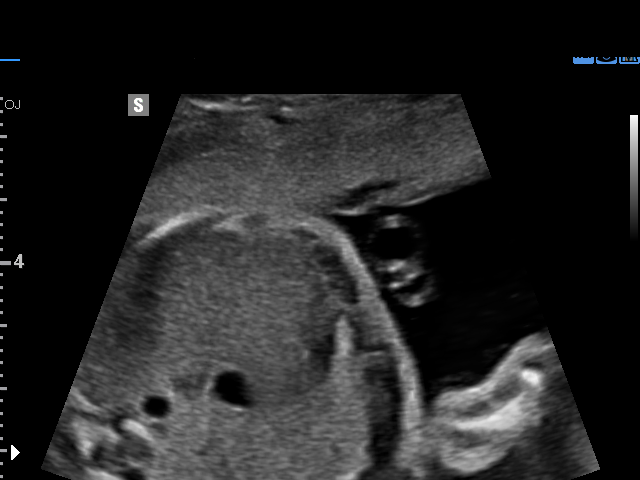
[im 6/23]
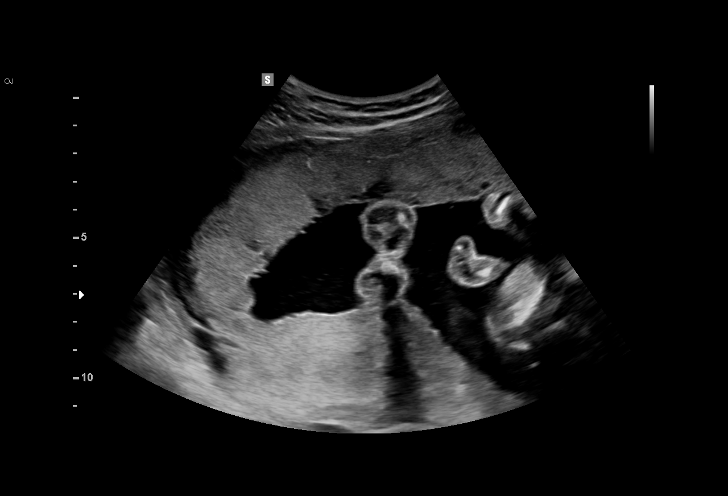
[im 7/23]
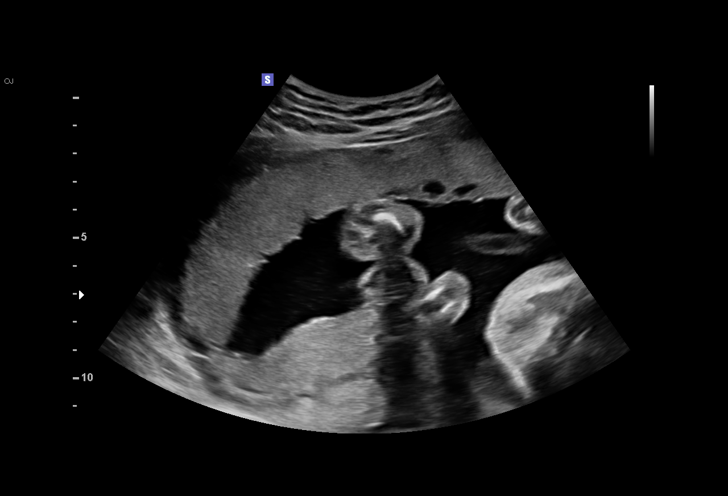
[im 9/23]
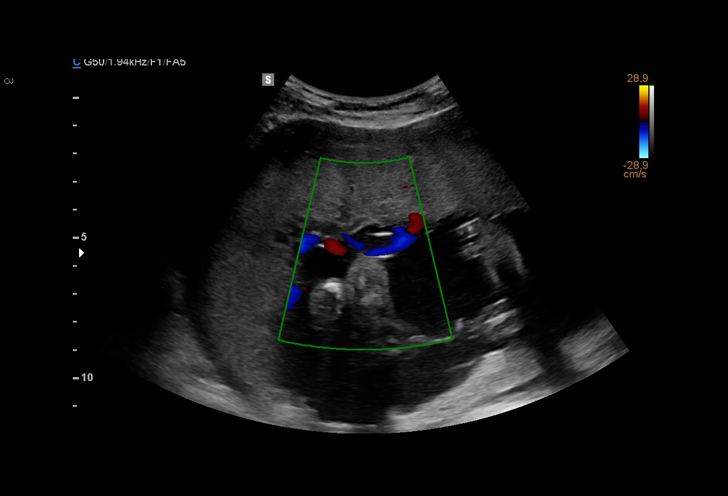
[im 10/23]
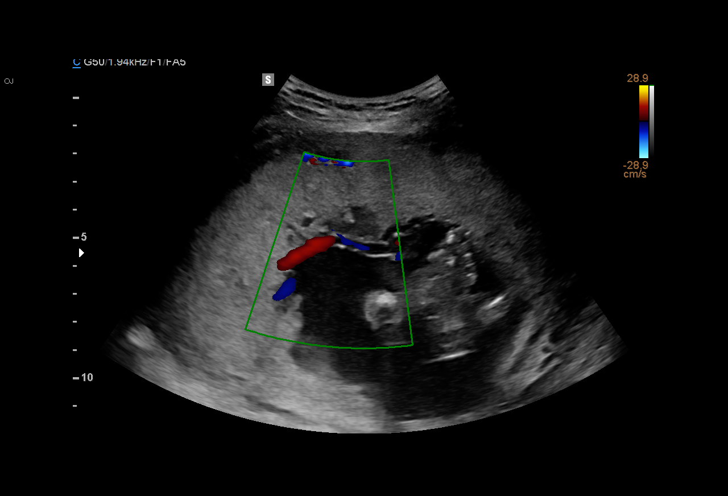
[im 12/23]
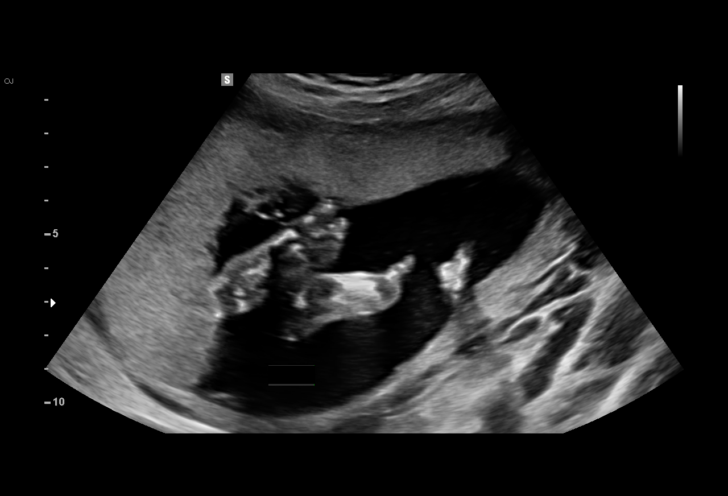
[im 14/23]
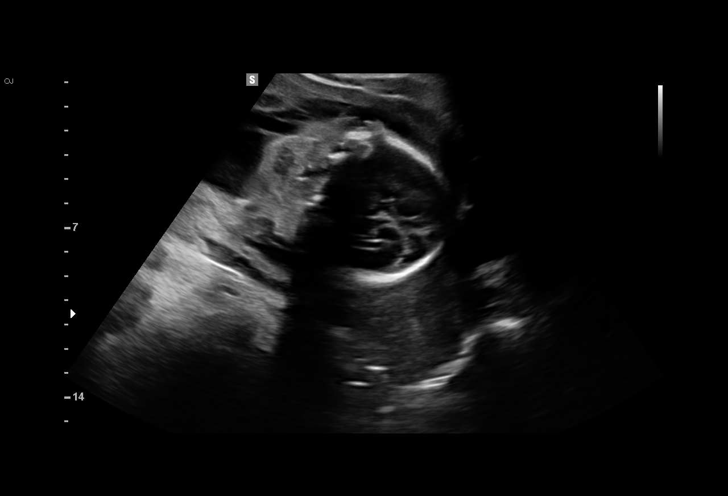
[im 15/23]
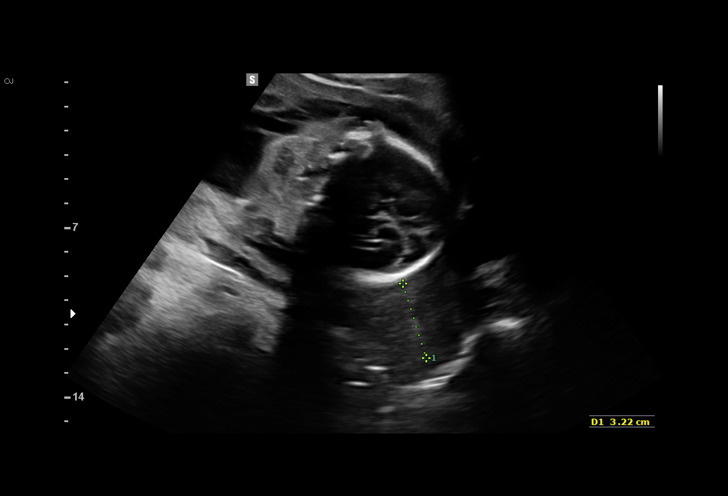
[im 17/23]
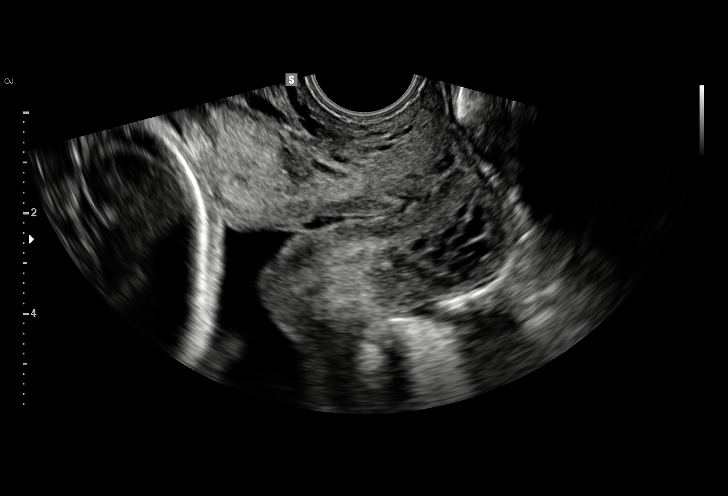
[im 18/23]
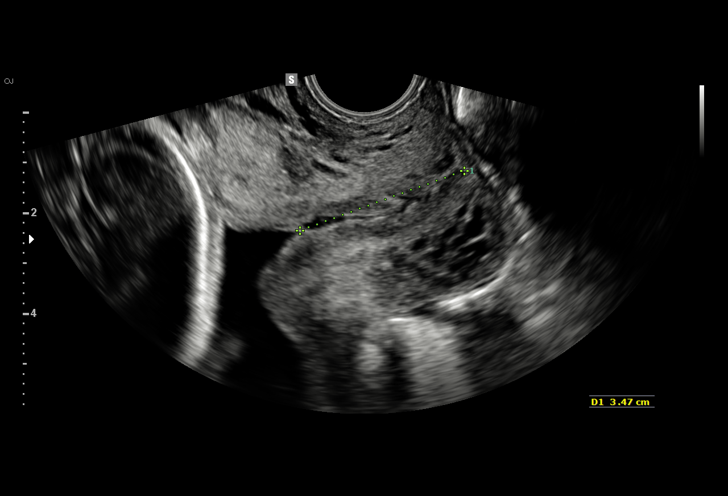
[im 20/23]
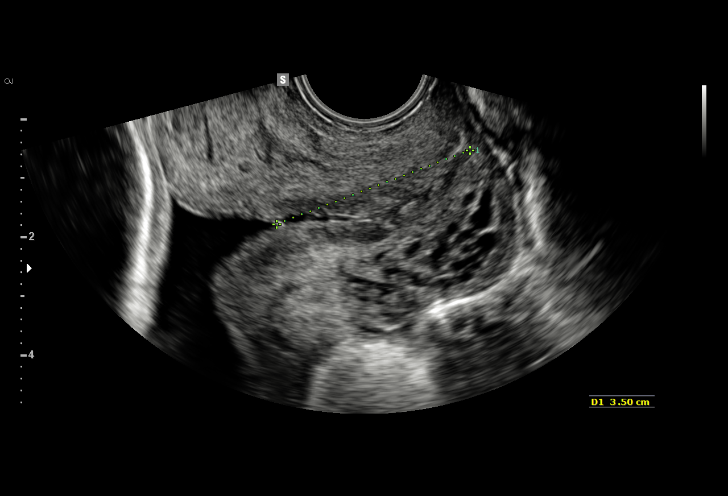
[im 21/23]
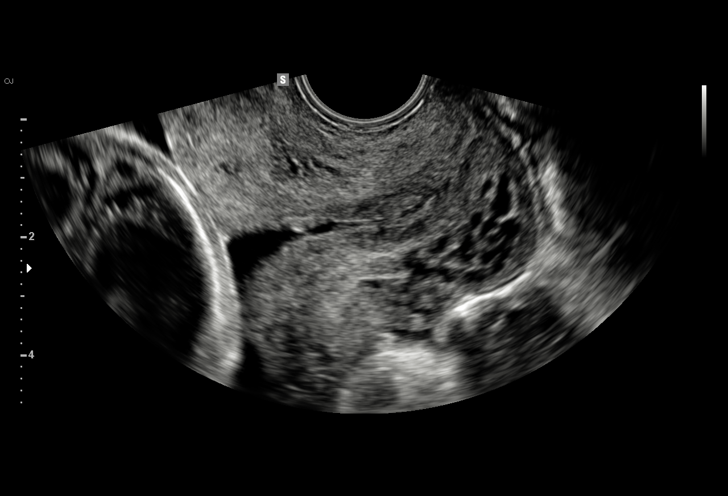
[im 23/23]
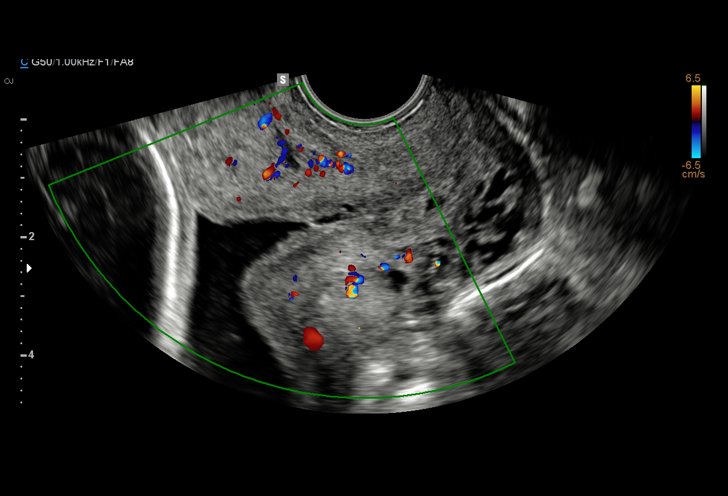

[15 of 23 positions shown; findings below may reference images not displayed]

OB/Gyn Clinic
[REDACTED]-
Faculty Physician

1  ELIEZER GALERANI            [id number]      [phone]     [phone]
Indications

24 weeks gestation of pregnancy
Other mental disorder complicating
pregnancy, second trimester (depression,
schizophrenia)
Poor obstetric history: Previous preterm
delivery (24 weeks, neonatal [place])-[place]
OB History

Gravidity:    4         Term:   2        Prem:   1        SAB:   0
TOP:          0       Ectopic:  0        Living: 2
Fetal Evaluation

Num Of Fetuses:     1
Fetal Heart         159
Rate(bpm):
Cardiac Activity:   Observed
Presentation:       Cephalic
Placenta:           Anterior, above cervical os

Amniotic Fluid
AFI FV:      Subjectively within normal limits

Largest Pocket(cm)
5.4
Gestational Age

LMP:           24w 0d       Date:   07/17/15                 EDD:   04/22/16
Best:          24w 0d    Det. By:   LMP  (07/17/15)          EDD:   04/22/16
Cervix Uterus Adnexa

Cervix
Length:            3.5  cm.
Normal appearance by transvaginal scan

Uterus
No abnormality visualized.
Impression

Single IUP at 24w 0d
Hx of previous 24 week spontaneous delivery, currently
receiving 17-P injections

TVUS - cervical length 3.5 cm without funneling
Recommendations

Keep scheduled appointment in 2 weeks for cervical length -
if stable, would follow clinically thereafter

## 2019-03-05 ENCOUNTER — Other Ambulatory Visit: Payer: Self-pay | Admitting: Registered"

## 2019-03-05 DIAGNOSIS — Z20822 Contact with and (suspected) exposure to covid-19: Secondary | ICD-10-CM

## 2019-03-07 LAB — NOVEL CORONAVIRUS, NAA: SARS-CoV-2, NAA: NOT DETECTED

## 2019-09-13 ENCOUNTER — Encounter: Payer: Self-pay | Admitting: *Deleted

## 2020-03-05 ENCOUNTER — Telehealth (HOSPITAL_COMMUNITY): Payer: Self-pay | Admitting: *Deleted

## 2020-03-05 NOTE — Telephone Encounter (Signed)
Called back to Rayan, patients daughter to give walk in times for her mom to be seen for initial appt with a provider to get her set up for her injection. Explained the first come first serve based on Dr availability. She expressed her understanding. We dont have a [place] sample, can start PAP paperwork that day and have Sustena samples depending on what the Dr orders.

## 2020-03-05 NOTE — Telephone Encounter (Signed)
Call from patients daughter, Rayan asking if her shot is here. I do not know this patient and have no knowledge of her or her shot, though I am the one that would be administering it. Rayan couldn't provide a lot of needed hx, but when asked thought she had been a patient of [facility] and last had her shot in June/ 21. Told her I would due some research re this and call her back today. I called [facility], they can confirm she takes [place] ,last given per Neibert on 10/31/19. She will need the PA program. She also had a rx for Risperidone 2 mg to be started 12 weeks after her injection. Will make her an appt for an initial eval here and an appt for her injection. Will have her complete PA paperwork to provide her shot for her.

## 2020-03-11 ENCOUNTER — Telehealth (HOSPITAL_COMMUNITY): Payer: Self-pay | Admitting: *Deleted

## 2020-03-11 NOTE — Telephone Encounter (Signed)
Patient, husband and two daughters presented for a walk in but came just at 31 and walk in hours have ended. She is in need of [place]. Initially Husband presented [facility] AND BS card along with a MEDICAID card but when front desk staff ran them thru both were inactive. Due to the expense of the injection she has been on we cant provide a sample. Did have the family complete PAP form and Husband, Kocourek, had his taxes in the car to attach to the PAP app. Gave them an appt for next week with Eddie PA, explained patient may have to have oral meds or the monthly shot we have samples for until can get her on PAP and receive the injection from [place] [facility] [place]. Daughter who is 59 is the primary [place] speaker but will not be able to accompany them all next week. She verbalized her understanding of the plan.

## 2020-03-19 ENCOUNTER — Ambulatory Visit (HOSPITAL_COMMUNITY): Payer: No Payment, Other | Admitting: *Deleted

## 2020-03-19 ENCOUNTER — Ambulatory Visit (INDEPENDENT_AMBULATORY_CARE_PROVIDER_SITE_OTHER): Payer: No Payment, Other | Admitting: Physician Assistant

## 2020-03-19 ENCOUNTER — Other Ambulatory Visit: Payer: Self-pay

## 2020-03-19 ENCOUNTER — Encounter (HOSPITAL_COMMUNITY): Payer: Self-pay | Admitting: Physician Assistant

## 2020-03-19 VITALS — BP 111/77 | HR 84 | Ht 62.0 in | Wt 130.0 lb

## 2020-03-19 DIAGNOSIS — F203 Undifferentiated schizophrenia: Secondary | ICD-10-CM

## 2020-03-19 MED ORDER — PALIPERIDONE PALMITATE ER 156 MG/ML IM SUSY
156.0000 mg | PREFILLED_SYRINGE | Freq: Once | INTRAMUSCULAR | Status: AC
  Administered 2020-03-19: 156 mg via INTRAMUSCULAR

## 2020-03-19 NOTE — Progress Notes (Signed)
First injection of [place] at this clinic. She initially saw Eddie PA before Clinical research associate and he wrote an order for this injection. She has been taking [place] q 3 months, verified by [place] but we dont have samples of it, until her PAP application comes thru will maintain her on the monthly Invega. She took the injection of Invega Sustenna 156 mg in R deltoid without difficulty. She is to return in one months for next injection and also to be monitored by [facility].

## 2020-03-22 ENCOUNTER — Encounter (HOSPITAL_COMMUNITY): Payer: Self-pay | Admitting: Physician Assistant

## 2020-03-22 NOTE — Progress Notes (Signed)
Psychiatric Initial Adult Assessment   Patient Identification: Amy Mcclain MRN:  [id number] Date of Evaluation:  03/22/2020 Referral Source: Vesta Mixer Chief Complaint: "Here for injection she gets every 3 months" Chief Complaint    New Patient (Initial Visit); Medication Management     Visit Diagnosis:    ICD-10-CM   1. Schizophrenia, undifferentiated (HCC)  F20.3 paliperidone (INVEGA SUSTENNA) injection 156 mg    History of Present Illness: Amy Mcclain is a 34 year old female with a past medical history significant for schizophrenia who presents to [facility] accompanied by husband and daughter for "Here for injection that she gets every 3 months." Telemedicine translating services were used during the encounter. Per patient, patient was last seen at [facility] in June. Per husband, the patient was being seen for "depression." During that last visit, patient had received an [place] sustenna 156 mg injection. After discussing what the Invega sustenna injection is indicated for, the patient corrected herself and said she is being treated for schizophrenia. Patient also explained that the three month Invega injection she is supposed to be getting is different from [place]. It was later determined that the patient is supposed to be getting the Eyvonne Mechanic, which is a three month injection. Patient is able to procure this medication through patient assistance medications but it takes about a month to get the medication to our facility. The medication is currently not in our possession. Since the medication was not sent to [place], it was determined that the patient either needs the pill form of the drug or get the Invega sustenna injection which is the one month injection. Patient states that she is 16 days late for her injection. In light of this fact, it was decided that the patient will get the Invega sustenna 1 month injection.  Patient denies suicide and  homicide ideation. Patient further denies auditory or visual hallucinations. Patient endorses good sleep. Patient gets approximately 7 hours of sleep and feels well rested upon waking. Patient reports good appetite and has approximately 2 meals a day. Patient denies alcohol, tobacco, and illicit drug use.   Associated Signs/Symptoms: Depression Symptoms:  difficulty concentrating, (Hypo) Manic Symptoms:  N/A Anxiety Symptoms:  N/A Psychotic Symptoms:  N/A PTSD Symptoms: Negative  Past Psychiatric History: Schizophrenia Depression  Previous Psychotropic Medications: Yes   Substance Abuse History in the last 12 months:  No.  Consequences of Substance Abuse: NA  Past Medical History:  Past Medical History:  Diagnosis Date  . Anemia   . Depression   . Schizophrenia [facility])     Past Surgical History:  Procedure Laterality Date  . NO PAST SURGERIES      Family Psychiatric History: Unknown  Family History: History reviewed. No pertinent family history.  Social History:   Social History   Socioeconomic History  . Marital status: Married    Spouse name: Not on file  . Number of children: Not on file  . Years of education: Not on file  . Highest education level: Not on file  Occupational History  . Not on file  Tobacco Use  . Smoking status: Never Smoker  . Smokeless tobacco: Never Used  Substance and Sexual Activity  . Alcohol use: No    Alcohol/week: 0.0 standard drinks  . Drug use: No  . Sexual activity: Yes  Other Topics Concern  . Not on file  Social History Narrative  . Not on file   Social Determinants of Health   Financial Resource Strain:   .  Difficulty of Paying Living Expenses: Not on file  Food Insecurity:   . Worried About Programme researcher, broadcasting/film/video in the Last Year: Not on file  . Ran Out of Food in the Last Year: Not on file  Transportation Needs:   . Lack of Transportation (Medical): Not on file  . Lack of Transportation (Non-Medical): Not on file   Physical Activity:   . Days of Exercise per Week: Not on file  . Minutes of Exercise per Session: Not on file  Stress:   . Feeling of Stress : Not on file  Social Connections:   . Frequency of Communication with Friends and Family: Not on file  . Frequency of Social Gatherings with Friends and Family: Not on file  . Attends Religious Services: Not on file  . Active Member of Clubs or Organizations: Not on file  . Attends Banker Meetings: Not on file  . Marital Status: Not on file    Additional Social History: N/A  Allergies:   Allergies  Allergen Reactions  . Ceftin [Cefuroxime Axetil] Rash    bruising    Metabolic Disorder Labs: Lab Results  Component Value Date   HGBA1C 4.8 10/18/2017   MPG 91.06 10/18/2017   Lab Results  Component Value Date   PROLACTIN 166.7 (H) 10/18/2017   Lab Results  Component Value Date   CHOL 168 10/18/2017   TRIG 60 10/18/2017   HDL 54 10/18/2017   CHOLHDL 3.1 10/18/2017   VLDL 12 10/18/2017   LDLCALC 102 (H) 10/18/2017   Lab Results  Component Value Date   TSH 1.713 10/18/2017    Therapeutic Level Labs: No results found for: LITHIUM No results found for: CBMZ No results found for: VALPROATE  Current Medications: Current Outpatient Medications  Medication Sig Dispense Refill  . paliperidone (INVEGA) 6 MG 24 hr tablet Take 1 tablet (6 mg total) by mouth daily. For mood control 15 tablet 0  . hydrOXYzine (ATARAX/VISTARIL) 50 MG tablet Take 1 tablet (50 mg total) by mouth every 6 (six) hours as needed for anxiety. (Patient not taking: Reported on 03/19/2020) 60 tablet 0  . paliperidone (INVEGA SUSTENNA) 156 MG/ML SUSY injection Inject 1 mL (156 mg total) into the muscle every 30 (thirty) days. (Due on 11-21-17): For mood control (Patient not taking: Reported on 03/19/2020) 1 mL 0  . traZODone (DESYREL) 100 MG tablet Take 1 tablet (100 mg total) by mouth at bedtime as needed for sleep. (Patient not taking: Reported on  03/19/2020) 30 tablet 0   No current facility-administered medications for this visit.    Musculoskeletal: Strength & Muscle Tone: within normal limits Gait & Station: normal Patient leans: N/A  Psychiatric Specialty Exam: Review of Systems  Constitutional: Negative.   HENT: Negative.   Eyes: Negative.   Respiratory: Negative.   Cardiovascular: Negative.   Gastrointestinal: Negative.   Endocrine: Negative.   Musculoskeletal: Negative.   Skin: Negative.   Neurological: Negative.   Hematological: Negative.   Psychiatric/Behavioral: Positive for decreased concentration.    Blood pressure 111/77, pulse 84, height 5\' 2"  (1.575 m), weight 130 lb (59 kg), SpO2 98 %, unknown if currently breastfeeding.Body mass index is 23.78 kg/m.  General Appearance: Well Groomed  Eye Contact:  Good  Speech:  Clear and Coherent, Normal Rate and Telemedicine translating services were utilized  Volume:  Normal  Mood:  Negative  Affect:  Appropriate  Thought Process:  Coherent and Goal Directed  Orientation:  Full (Time, Place, and Person)  Thought Content:  Logical  Suicidal Thoughts:  No  Homicidal Thoughts:  No  Memory:  Immediate;   Good Recent;   Good Remote;   Good  Judgement:  Good  Insight:  Good  Psychomotor Activity:  Normal  Concentration:  Concentration: Good and Attention Span: Good  Recall:  Good  Fund of Knowledge:Good  Language: Good  Akathisia:  Negative  Handed:  Right  AIMS (if indicated):  not done  Assets:  Communication Skills Desire for Improvement Housing Social Support  ADL's:  Intact  Cognition: WNL  Sleep:  Good   Screenings: AUDIT     [facility]  Alcohol Use Disorder Identification Test Final Score (AUDIT) 0    GAD-7     Routine Prenatal from 02/04/2016 in Center for [facility]  Total GAD-7 Score 0    PHQ2-9     Integrated Behavioral Health from 05/03/2016  in Center for [facility] Routine Prenatal from 04/12/2016 in Center for [facility] Routine Prenatal from 03/31/2016 in Center for [facility] Routine Prenatal from 02/04/2016 in Center for [facility] Clinical Support from 12/18/2015 in Center for [facility]  PHQ-2 Total Score 0 0 0 3 0  PHQ-9 Total Score 1 0 -- 6 1      Assessment and Plan:  Aubreanna Jeanbaptiste is a 34 year old female with a past medical history significant for schizophrenia who presents to [facility] accompanied by husband and daughter for "Here for injection that she gets every 3 months." Telemedicine translator services were used during the encounter. During the encounter it was determined that the patient's original injection Eyvonne Mechanic) had not been delivered to our facility yet. Patient was 16 days past her injection date. Patient was given the Invega sustenna injection 156 mg for the management of her schizophrenia.  1. Schizophrenia, undifferentiated (HCC) Invega sustenna injection administered in house for the management of schizophrenia.  - paliperidone (INVEGA SUSTENNA) injection 156 mg  Patient should plan to follow up within a month for another Invega sustenna injection if her Davina Poke has not come in yet.  Meta Hatchet, PA 10/23/202111:36 PM

## 2020-04-22 ENCOUNTER — Ambulatory Visit (INDEPENDENT_AMBULATORY_CARE_PROVIDER_SITE_OTHER): Payer: No Payment, Other | Admitting: Physician Assistant

## 2020-04-22 ENCOUNTER — Ambulatory Visit (HOSPITAL_COMMUNITY): Payer: No Payment, Other | Admitting: *Deleted

## 2020-04-22 ENCOUNTER — Other Ambulatory Visit: Payer: Self-pay

## 2020-04-22 ENCOUNTER — Encounter (HOSPITAL_COMMUNITY): Payer: Self-pay

## 2020-04-22 VITALS — BP 115/83 | HR 93 | Ht 62.0 in | Wt 140.0 lb

## 2020-04-22 DIAGNOSIS — F203 Undifferentiated schizophrenia: Secondary | ICD-10-CM | POA: Diagnosis not present

## 2020-04-22 MED ORDER — PALIPERIDONE PALMITATE ER 819 MG/2.625ML IM SUSY
546.0000 mg | PREFILLED_SYRINGE | INTRAMUSCULAR | Status: DC
  Administered 2020-04-22 – 2020-07-22 (×2): 546 mg via INTRAMUSCULAR

## 2020-04-22 MED ORDER — INVEGA TRINZA 546 MG/1.75ML IM SUSY: 546.0000 mg | PREFILLED_SYRINGE | INTRAMUSCULAR | 0 refills | Status: DC

## 2020-04-22 NOTE — Progress Notes (Signed)
In as scheduled for her injection but first met with Ludwig Clarks PA for assessment. Interpreter needed for interaction, husband also present and he does speak a fair amount of [place] but not [place]. Injection of Invega Trin[place] 546 mg given in her L deltoid without difficulty. She is pleasant and denies sx of her illness at this time. To return for next injection in three months.

## 2020-04-25 ENCOUNTER — Encounter (HOSPITAL_COMMUNITY): Payer: Self-pay | Admitting: Physician Assistant

## 2020-04-25 NOTE — Progress Notes (Signed)
BH MD/PA/NP OP Progress Note  04/25/2020 1:11 AM Amy Mcclain  MRN:  [id number]  Chief Complaint: Patient presents today for Amy Mcclain injection  HPI:  Amy Mcclain is a 34 year old female with a past psychiatric history significant for schizophrenia who presents to [facility] accompanied by husband for Amy Mcclain injection. Patient states that she has been doing well. Patient and her husband had questions in regards to the injection and future pregnancies. They had concerns on whether the injection could potentially harm the fetus during the pregnancy. Patient was informed that Amy Mcclain could cause extrapyramidal and/or withdrawal symptoms in neonates with third trimester exposure. Patient and her husband were advised to notify the healthcare provider/obgyn if they intend to become pregnant during treatment with Amy Mcclain. Patient's husband also had inquiries related to obtaining a letter specifying that his wife was being treated for a psychiatric illness. Patient and her husband had no other concerns at this time.  Patient's mood has been generally good. She denies suicide and homicide ideation. Patient further denies auditory or visual hallucinations. Patient reports good sleep and endorses being well rested upon waking. Patient reports good appetite and eats roughly 2 meals a day. Patient denies tobacco use, alcohol consumption, and illicit drug use.  Visit Diagnosis:    ICD-10-CM   1. 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 Anemia   . Depression   . Schizophrenia [facility])     Past Surgical History:  Procedure Laterality Date  . NO PAST SURGERIES      Family Psychiatric History: Unknown  Family History: No family history on file.  Social  History:  Social History   Socioeconomic History  . Marital status: Married    Spouse name: Not on file  . Number of children: Not on file  . Years of education: Not on file  . Highest education level: Not on file  Occupational History  . Not on file  Tobacco Use  . Smoking status: Never Smoker  . Smokeless tobacco: Never Used  Substance and Sexual Activity  . Alcohol use: No    Alcohol/week: 0.0 standard drinks  . Drug use: No  . Sexual activity: Yes  Other Topics Concern  . Not on file  Social History Narrative  . Not on file   Social Determinants of Health   Financial Resource Strain:   . Difficulty of Paying Living Expenses: Not on file  Food Insecurity:   . Worried About Programme researcher, broadcasting/film/video in the Last Year: Not on file  . Ran Out of Food in the Last Year: Not on file  Transportation Needs:   . Lack of Transportation (Medical): Not on file  . Lack of Transportation (Non-Medical): Not on file  Physical Activity:   . Days of Exercise per Week: Not on file  . Minutes of Exercise per Session: Not on file  Stress:   . Feeling of Stress : Not on file  Social Connections:   . Frequency of Communication with Friends and Family: Not on file  . Frequency of Social Gatherings with Friends and Family: Not on file  . Attends Religious Services: Not on file  . Active Member of Clubs or Organizations: Not on file  . Attends Banker Meetings: Not on file  . Marital Status: Not on file  Allergies:  Allergies  Allergen Reactions  . Ceftin [Cefuroxime Axetil] Rash    bruising    Metabolic Disorder Labs: Lab Results  Component Value Date   HGBA1C 4.8 10/18/2017   MPG 91.06 10/18/2017   Lab Results  Component Value Date   PROLACTIN 166.7 (H) 10/18/2017   Lab Results  Component Value Date   CHOL 168 10/18/2017   TRIG 60 10/18/2017   HDL 54 10/18/2017   CHOLHDL 3.1 10/18/2017   VLDL 12 10/18/2017   LDLCALC 102 (H) 10/18/2017   Lab Results   Component Value Date   TSH 1.713 10/18/2017    Therapeutic Level Labs: No results found for: LITHIUM No results found for: VALPROATE No components found for:  CBMZ  Current Medications: Current Outpatient Medications  Medication Sig Dispense Refill  . hydrOXYzine (ATARAX/VISTARIL) 50 MG tablet Take 1 tablet (50 mg total) by mouth every 6 (six) hours as needed for anxiety. (Patient not taking: Reported on 03/19/2020) 60 tablet 0  . paliperidone (INVEGA) 6 MG 24 hr tablet Take 1 tablet (6 mg total) by mouth daily. For mood control 15 tablet 0  . Paliperidone Palmitate ER (INVEGA TRINZA) 546 MG/1.75ML SUSY Inject 546 mg into the muscle every 3 (three) months. 1.8 mL 0  . traZODone (DESYREL) 100 MG tablet Take 1 tablet (100 mg total) by mouth at bedtime as needed for sleep. (Patient not taking: Reported on 03/19/2020) 30 tablet 0   Current Facility-Administered Medications  Medication Dose Route Frequency Provider Last Rate Last Admin  . Paliperidone Palmitate ER (INVEGA TRINZA) injection 546 mg  546 mg Intramuscular Q90 days Analysa Nutting E, PA   546 mg at 04/22/20 1547     Musculoskeletal: Strength & Muscle Tone: within normal limits Gait & Station: normal Patient leans: N/A  Psychiatric Specialty Exam: Review of Systems  Psychiatric/Behavioral: Negative for hallucinations and suicidal ideas.    unknown if currently breastfeeding.There is no height or weight on file to calculate BMI.  General Appearance: Well Groomed  Eye Contact:  Good  Speech:  Clear and Coherent and Normal Rate  Volume:  Normal  Mood:  Patient is pleasant  Affect:  Appropriate  Thought Process:  Coherent and Goal Directed  Orientation:  Full (Time, Place, and Person)  Thought Content: WDL and Logical   Suicidal Thoughts:  No  Homicidal Thoughts:  No  Memory:  Immediate;   Good Recent;   Good Remote;   Good  Judgement:  Good  Insight:  Good  Psychomotor Activity:  Normal  Concentration:   Concentration: Good and Attention Span: Good  Recall:  Good  Fund of Knowledge: Good  Language: Good  Akathisia:  NA  Handed:  Right  AIMS (if indicated): not done  Assets:  Communication Skills Desire for Improvement Housing Social Support  ADL's:  Intact  Cognition: WNL  Sleep:  Good   Screenings: AUDIT     [facility]  Alcohol Use Disorder Identification Test Final Score (AUDIT) 0    GAD-7     Routine Prenatal from 02/04/2016 in Center for [facility]  Total GAD-7 Score 0    PHQ2-9     Integrated Behavioral Health from 05/03/2016 in Center for [facility] Routine Prenatal from 04/12/2016 in Center for [facility] Routine Prenatal from 03/31/2016 in Center for [facility] Routine Prenatal from 02/04/2016 in Center for [facility] Clinical Support from 12/18/2015 in Center for  [facility]  PHQ-2 Total Score 0 0 0 3 0  PHQ-9 Total Score 1 0 -- 6 1       Assessment and Plan:  Rollande Etzkorn is a 34 year old female with a past psychiatric history significant for schizophrenia who presents to [facility] accompanied by husband for Amy Mcclain injection. Telemedicine translator services were used during the encounter. Patient received her Amy Mcclain injection and will be scheduled for her next upon conclusion of encounter.  1. Schizophrenia, undifferentiated (HCC)  - Paliperidone Palmitate ER (INVEGA TRINZA) 546 MG/1.75ML SUSY; Inject 546 mg into the muscle every 3 (three) months.  Dispense: 1.8 mL; Refill: 0  Patient to follow up in 74-months.  Meta Hatchet, PA 04/25/2020, 1:11 AM

## 2020-06-20 ENCOUNTER — Encounter (HOSPITAL_COMMUNITY): Payer: Self-pay | Admitting: Physician Assistant

## 2020-07-22 ENCOUNTER — Encounter (HOSPITAL_COMMUNITY): Payer: Self-pay | Admitting: Physician Assistant

## 2020-07-22 ENCOUNTER — Ambulatory Visit (INDEPENDENT_AMBULATORY_CARE_PROVIDER_SITE_OTHER): Payer: No Payment, Other | Admitting: *Deleted

## 2020-07-22 ENCOUNTER — Telehealth (INDEPENDENT_AMBULATORY_CARE_PROVIDER_SITE_OTHER): Payer: No Payment, Other | Admitting: Physician Assistant

## 2020-07-22 ENCOUNTER — Encounter (HOSPITAL_COMMUNITY): Payer: Self-pay

## 2020-07-22 ENCOUNTER — Other Ambulatory Visit: Payer: Self-pay

## 2020-07-22 VITALS — BP 115/69 | HR 86 | Ht 62.0 in | Wt 141.8 lb

## 2020-07-22 DIAGNOSIS — F203 Undifferentiated schizophrenia: Secondary | ICD-10-CM

## 2020-07-22 NOTE — Progress Notes (Signed)
PATIENT ARRIVED FOR INJECTION 546 INVEGA TRINZA TODAY WITH HUSBAND. PATIENT PLEASANT AS ALWAYS . TOLERATED INJECTION WELL IN RIGHT ARM. NO HI/SI NOR AH/VH

## 2020-07-22 NOTE — Progress Notes (Signed)
BH MD/PA/NP OP Progress Note  Virtual Visit via Telephone Note  I connected with Amy Mcclain on 07/22/20 at  2:30 PM EST by telephone and verified that I am speaking with the correct person using two identifiers.  Location: Patient: Home Provider: Clinic   I discussed the limitations, risks, security and privacy concerns of performing an evaluation and management service by telephone and the availability of in person appointments. I also discussed with the patient that there may be a patient responsible charge related to this service. The patient expressed understanding and agreed to proceed.  Follow Up Instructions:   I discussed the assessment and treatment plan with the patient. The patient was provided an opportunity to ask questions and all were answered. The patient agreed with the plan and demonstrated an understanding of the instructions.   The patient was advised to call back or seek an in-person evaluation if the symptoms worsen or if the condition fails to improve as anticipated.  I provided 18 minutes of non-face-to-face time during this encounter.  Meta Hatchet, PA   07/22/2020 3:28 PM Rusty Glodowski  MRN:  [id number]  Chief Complaint: Follow up and medication management  HPI:  Amy Mcclain is a 35 year old female with a past psychiatric history significant for schizophrenia who presents to [facility] via virtual telephone visit for follow-up.  Patient received her Eyvonne Mechanic injection today.  Patient reports no issues or concerns with her injection.  Patient denies changes in life events or stressors.  The patient's only concern today is regarding paperwork involving citizenship.  Patient is calm, cooperative, and fully engaged in conversation during the encounter.  Patient is accompanied by her husband during the duration of the encounter.  Patient is in a very good mood. Patient denies suicidal or homicidal ideations.  She further  denies auditory or visual hallucinations. Patient endorses good sleep and receives on average 7 hours of sleep at night.  Patient endorses good appetite and eats on average 2 meals per day.  Patient denies alcohol consumption, tobacco use, and illicit drug use.   Visit Diagnosis:    ICD-10-CM   1. Schizophrenia, undifferentiated (HCC)  F20.3     Past Psychiatric History:  Schizophrenia Depression  Past Medical History:  Past Medical History:  Diagnosis Date  . Anemia   . Depression   . Schizophrenia [facility])     Past Surgical History:  Procedure Laterality Date  . NO PAST SURGERIES      Family Psychiatric History:  Unknown  Family History: No family history on file.  Social History:  Social History   Socioeconomic History  . Marital status: Married    Spouse name: Not on file  . Number of children: Not on file  . Years of education: Not on file  . Highest education level: Not on file  Occupational History  . Not on file  Tobacco Use  . Smoking status: Never Smoker  . Smokeless tobacco: Never Used  Substance and Sexual Activity  . Alcohol use: No    Alcohol/week: 0.0 standard drinks  . Drug use: No  . Sexual activity: Yes  Other Topics Concern  . Not on file  Social History Narrative  . Not on file   Social Determinants of Health   Financial Resource Strain: Not on file  Food Insecurity: Not on file  Transportation Needs: Not on file  Physical Activity: Not on file  Stress: Not on file  Social Connections: Not on file  Allergies:  Allergies  Allergen Reactions  . Ceftin [Cefuroxime Axetil] Rash    bruising    Metabolic Disorder Labs: Lab Results  Component Value Date   HGBA1C 4.8 10/18/2017   MPG 91.06 10/18/2017   Lab Results  Component Value Date   PROLACTIN 166.7 (H) 10/18/2017   Lab Results  Component Value Date   CHOL 168 10/18/2017   TRIG 60 10/18/2017   HDL 54 10/18/2017   CHOLHDL 3.1 10/18/2017   VLDL 12 10/18/2017   LDLCALC  102 (H) 10/18/2017   Lab Results  Component Value Date   TSH 1.713 10/18/2017    Therapeutic Level Labs: No results found for: LITHIUM No results found for: VALPROATE No components found for:  CBMZ  Current Medications: Current Outpatient Medications  Medication Sig Dispense Refill  . hydrOXYzine (ATARAX/VISTARIL) 50 MG tablet Take 1 tablet (50 mg total) by mouth every 6 (six) hours as needed for anxiety. 60 tablet 0  . paliperidone (INVEGA) 6 MG 24 hr tablet Take 1 tablet (6 mg total) by mouth daily. For mood control 15 tablet 0  . Paliperidone Palmitate ER (INVEGA TRINZA) 546 MG/1.75ML SUSY Inject 546 mg into the muscle every 3 (three) months. 1.8 mL 0  . traZODone (DESYREL) 100 MG tablet Take 1 tablet (100 mg total) by mouth at bedtime as needed for sleep. 30 tablet 0   Current Facility-Administered Medications  Medication Dose Route Frequency Provider Last Rate Last Admin  . Paliperidone Palmitate ER (INVEGA TRINZA) injection 546 mg  546 mg Intramuscular Q90 days Camar Guyton E, PA   546 mg at 07/22/20 1400      Musculoskeletal: Strength & Muscle Tone: Unable to assess due to telemedicine visit Gait & Station: Unable to assess due to telemedicine visit Patient leans: Unable to assess due to telemedicine visit  Psychiatric Specialty Exam: Review of Systems  Psychiatric/Behavioral: Negative for decreased concentration, dysphoric mood, hallucinations, self-injury, sleep disturbance and suicidal ideas. The patient is not nervous/anxious and is not hyperactive.     unknown if currently breastfeeding.There is no height or weight on file to calculate BMI.  General Appearance: Unable to assess due to telemedicine visit  Eye Contact:  Unable to assess due to telemedicine visit  Speech:  Clear and Coherent and Normal Rate  Volume:  Normal  Mood:  Euthymic  Affect:  Appropriate  Thought Process:  Coherent, Goal Directed and Descriptions of Associations: Intact  Orientation:   Full (Time, Place, and Person)  Thought Content: WDL and Logical   Suicidal Thoughts:  No  Homicidal Thoughts:  No  Memory:  Immediate;   Good Recent;   Good Remote;   Good  Judgement:  Good  Insight:  Good  Psychomotor Activity:  Normal  Concentration:  Concentration: Good and Attention Span: Good  Recall:  Good  Fund of Knowledge: Good  Language: Good  Akathisia:  No  Handed:  Right  AIMS (if indicated): not done  Assets:  Communication Skills Desire for Improvement Housing Social Support  ADL's:  Intact  Cognition: WNL  Sleep:  Good   Screenings: AUDIT   Flowsheet Row [facility]  Alcohol Use Disorder Identification Test Final Score (AUDIT) 0    GAD-7   Flowsheet Row Video Visit from 07/22/2020 in [facility] Routine Prenatal from 02/04/2016 in Center for [facility]  Total GAD-7 Score 2 0    PHQ2-9   Flowsheet Row Video Visit  from 07/22/2020 in [facility] Integrated Behavioral Health from 05/03/2016 in Center for [facility] Routine Prenatal from 04/12/2016 in Center for [facility] Routine Prenatal from 03/31/2016 in Center for [facility] Routine Prenatal from 02/04/2016 in Center for [facility]  PHQ-2 Total Score 0 0 0 0 3  PHQ-9 Total Score - 1 0 - 6    Flowsheet Row Video Visit from 07/22/2020 in [facility]  C-SSRS RISK CATEGORY No Risk       Assessment and Plan:   Amy Mcclain is a 35 year old female with a past psychiatric history significant for schizophrenia who presents to [facility] via virtual telephone visit for follow-up.  Patient received her Eyvonne Mechanic injection today.  Patient reports no issues or concerns with her injection.  Patient denies changes in life events or  stressors.  The patient's only concern today is regarding paperwork involving citizenship.  Provider informed patient and her husband that he would provide any information necessary to help them complete their citizenship forms.  Patient to return for follow-up appointment during her next injection.  1. Schizophrenia, undifferentiated (HCC)  Paliperidone Palmitate ER (INVEGA TRINZA) 546 MG/1.75ML SUSY; Inject 546 mg into the muscle every 3 (three) months.  Patient to follow-up in 3 months  Meta Hatchet, PA 07/22/2020, 3:28 PM

## 2020-08-25 ENCOUNTER — Telehealth (HOSPITAL_COMMUNITY): Payer: Self-pay | Admitting: *Deleted

## 2020-08-25 NOTE — Telephone Encounter (Signed)
Staff from [employer] office called to share information with Otila Back PA and would like him to call her office re this patient Her prolactin level was elevated at 118 and patient had concerns about medicine causing fatigue and irregular menses. Will forward the concern to [facility].

## 2020-09-05 ENCOUNTER — Other Ambulatory Visit: Payer: Self-pay | Admitting: Nurse Practitioner

## 2020-09-05 DIAGNOSIS — E221 Hyperprolactinemia: Secondary | ICD-10-CM

## 2020-09-24 NOTE — Telephone Encounter (Signed)
Message was relayed to provider by Wynona Luna, RN regarding message left by patient's primary care provider regarding elevated prolactin level. Will be in contact with patient's provider with plans to manage patient's elevated prolactin level.

## 2020-09-30 ENCOUNTER — Other Ambulatory Visit: Payer: Self-pay

## 2020-09-30 ENCOUNTER — Ambulatory Visit
Admission: RE | Admit: 2020-09-30 | Discharge: 2020-09-30 | Disposition: A | Discharge status: Home / Self Care | Payer: 59 | Source: Ambulatory Visit | Attending: Nurse Practitioner | Admitting: Nurse Practitioner

## 2020-09-30 DIAGNOSIS — E221 Hyperprolactinemia: Secondary | ICD-10-CM

## 2020-09-30 MED ORDER — GADOBENATE DIMEGLUMINE 529 MG/ML IV SOLN
7.0000 mL | Freq: Once | INTRAVENOUS | Status: AC | PRN
  Administered 2020-09-30: 7 mL via INTRAVENOUS

## 2020-10-06 ENCOUNTER — Telehealth (HOSPITAL_COMMUNITY): Payer: Self-pay | Admitting: *Deleted

## 2020-10-06 NOTE — Telephone Encounter (Signed)
Provider received message from Wynona Luna, RN regarding voicemail left by Dr. Vincenza Hews. Provider contacted [facility] and was able to speak with Dr. Elise Benne nurse, Joni Reining. Provider will reach out to Dr. Vincenza Hews tomorrow to discuss patient's plan of care.

## 2020-10-06 NOTE — Telephone Encounter (Signed)
VM left on writers phone re this patient that we have in common with [facility], Dr Vincenza Hews. The office is reaching out to speak with Link Snuffer PA re this patient and recent lab work she had done at their office. Link Snuffer is out of the office today but will leave him this message when he returns tomorrow.

## 2020-10-09 ENCOUNTER — Other Ambulatory Visit (HOSPITAL_COMMUNITY): Payer: Self-pay | Admitting: Physician Assistant

## 2020-10-09 DIAGNOSIS — F203 Undifferentiated schizophrenia: Secondary | ICD-10-CM

## 2020-10-09 MED ORDER — ARIPIPRAZOLE 5 MG PO TABS: 5.0000 mg | ORAL_TABLET | Freq: Every day | ORAL | 0 refills | Status: DC

## 2020-10-09 MED ORDER — ARIPIPRAZOLE ER 400 MG IM PRSY
400.0000 mg | PREFILLED_SYRINGE | INTRAMUSCULAR | Status: DC
  Administered 2020-10-21: 400 mg via INTRAMUSCULAR

## 2020-10-09 NOTE — Progress Notes (Signed)
Provider was able to reach out to patient's primary care provider (Dr. Vincenza Hews) over at [facility] in regards to plan of care regarding patient's elevated prolactin levels. Provider discussed with Dr. Vincenza Hews that patient would discontinue taking Invega Trinza 546 mg injection and would start taking Abilify maintain a 400 mg intramuscular injection monthly for the management of her schizophrenia.  Provider informed Dr. Vincenza Hews that patient would start on an oral loading dose of Abilify 5 mg daily up until the time of the patient's scheduled Abilify Maintena injection.  Writer contacted patient's husband to discuss patient's change in medication management.  Patient's husband was appropriately provided details on why patient would be discontinuing her Eyvonne Mechanic injection.  Provider informed patient's husband that patient would need to start taking Abilify 5 mg daily up until the time of her Abilify Maintena 400 mg injection.  Patient's husband was receptive to instruction.  Patient's medications to be e-prescribed to pharmacy of choice.  Patient's Abilify Maintena 400 mg injection is scheduled for 10/21/2020.

## 2020-10-21 ENCOUNTER — Ambulatory Visit (HOSPITAL_COMMUNITY): Payer: No Payment, Other | Admitting: *Deleted

## 2020-10-21 ENCOUNTER — Encounter (HOSPITAL_COMMUNITY): Payer: Self-pay | Admitting: Physician Assistant

## 2020-10-21 ENCOUNTER — Encounter (HOSPITAL_COMMUNITY): Payer: Self-pay

## 2020-10-21 ENCOUNTER — Other Ambulatory Visit: Payer: Self-pay

## 2020-10-21 ENCOUNTER — Ambulatory Visit (INDEPENDENT_AMBULATORY_CARE_PROVIDER_SITE_OTHER): Payer: 59 | Admitting: Physician Assistant

## 2020-10-21 VITALS — BP 128/86 | HR 98 | Ht 62.0 in | Wt 141.0 lb

## 2020-10-21 DIAGNOSIS — F203 Undifferentiated schizophrenia: Secondary | ICD-10-CM

## 2020-10-21 MED ORDER — ARIPIPRAZOLE ER 400 MG IM PRSY: 400.0000 mg | PREFILLED_SYRINGE | INTRAMUSCULAR | Status: AC

## 2020-10-21 NOTE — Progress Notes (Signed)
BH MD/PA/NP OP Progress Note  10/21/2020 4:02 PM Amy Mcclain  MRN:  [id number]  Chief Complaint: Follow up appointment  HPI:   Amy Mcclain is a 35 year old female with a past psychiatric history significant for schizophrenia who presents to [facility] for follow-up.  Due to language barrier, translation interpretive services were utilized during the encounter. Patient presents today to receive her Abilify Maintaina 400 mg injection.  Patient reports that her injection went well and states that she tolerated the oral dose of Abilify (Abilify 5 mg daily) she took roughly 2 weeks prior to receiving her injection today.  Patient reports feeling a little under the weather during the time she was taking her Abilify oral dose and was not sure if she had contracted the flu or her sickness was due to taking the oral medications.  Patient denies any current issues regarding her mental health.  Patient is pleasant, calm, cooperative, and fully engaged in conversation during the encounter.  Patient reports no problems with her mood and states that she feels very perfect.  Patient denies suicidal or homicidal ideations.  She further denies auditory or visual hallucinations and does not appear to be responding to internal/external stimuli.  Patient endorses good sleep and states that she receives on average 6 to 7 hours of sleep each night.  Patient endorses good appetite and eats on average 2 meals per day.  Patient denies alcohol consumption, tobacco use, and illicit drug use.  Visit Diagnosis:    ICD-10-CM   1. Schizophrenia, undifferentiated (HCC)  F20.3 ARIPiprazole ER (ABILIFY MAINTENA) 400 MG prefilled syringe 400 mg    Past Psychiatric History:  Schizophrenia Depression  Past Medical History:  Past Medical History:  Diagnosis Date  . Anemia   . Depression   . Schizophrenia [facility])     Past Surgical History:  Procedure Laterality Date  . NO PAST SURGERIES       Family Psychiatric History:  Unknown  Family History: No family history on file.  Social History:  Social History   Socioeconomic History  . Marital status: Married    Spouse name: Not on file  . Number of children: Not on file  . Years of education: Not on file  . Highest education level: Not on file  Occupational History  . Not on file  Tobacco Use  . Smoking status: Never Smoker  . Smokeless tobacco: Never Used  Substance and Sexual Activity  . Alcohol use: No    Alcohol/week: 0.0 standard drinks  . Drug use: No  . Sexual activity: Yes  Other Topics Concern  . Not on file  Social History Narrative  . Not on file   Social Determinants of Health   Financial Resource Strain: Not on file  Food Insecurity: Not on file  Transportation Needs: Not on file  Physical Activity: Not on file  Stress: Not on file  Social Connections: Not on file    Allergies:  Allergies  Allergen Reactions  . Ceftin [Cefuroxime Axetil] Rash    bruising    Metabolic Disorder Labs: Lab Results  Component Value Date   HGBA1C 4.8 10/18/2017   MPG 91.06 10/18/2017   Lab Results  Component Value Date   PROLACTIN 166.7 (H) 10/18/2017   Lab Results  Component Value Date   CHOL 168 10/18/2017   TRIG 60 10/18/2017   HDL 54 10/18/2017   CHOLHDL 3.1 10/18/2017   VLDL 12 10/18/2017   LDLCALC 102 (H) 10/18/2017  Lab Results  Component Value Date   TSH 1.713 10/18/2017    Therapeutic Level Labs: No results found for: LITHIUM No results found for: VALPROATE No components found for:  CBMZ  Current Medications: Current Outpatient Medications  Medication Sig Dispense Refill  . ARIPiprazole (ABILIFY) 5 MG tablet Take 1 tablet (5 mg total) by mouth daily for 12 days. 12 tablet 0  . hydrOXYzine (ATARAX/VISTARIL) 50 MG tablet Take 1 tablet (50 mg total) by mouth every 6 (six) hours as needed for anxiety. 60 tablet 0  . traZODone (DESYREL) 100 MG tablet Take 1 tablet (100 mg total)  by mouth at bedtime as needed for sleep. 30 tablet 0   Current Facility-Administered Medications  Medication Dose Route Frequency Provider Last Rate Last Admin  . [START ON 11/18/2020] ARIPiprazole ER (ABILIFY MAINTENA) 400 MG prefilled syringe 400 mg  400 mg Intramuscular Q28 days Florance Paolillo, Tommas Olp, PA         Musculoskeletal: Strength & Muscle Tone: within normal limits Gait & Station: normal Patient leans: N/A  Psychiatric Specialty Exam: Review of Systems  Psychiatric/Behavioral: Negative for decreased concentration, dysphoric mood, hallucinations, self-injury, sleep disturbance and suicidal ideas. The patient is not nervous/anxious and is not hyperactive.     unknown if currently breastfeeding.There is no height or weight on file to calculate BMI.  General Appearance: Well Groomed  Eye Contact:  Good  Speech:  Clear and Coherent and Normal Rate  Volume:  Normal  Mood:  Euthymic  Affect:  Appropriate  Thought Process:  Coherent and Descriptions of Associations: Intact  Orientation:  Full (Time, Place, and Person)  Thought Content: WDL   Suicidal Thoughts:  No  Homicidal Thoughts:  No  Memory:  Immediate;   Good Recent;   Good Remote;   Good  Judgement:  Good  Insight:  Good  Psychomotor Activity:  Normal  Concentration:  Concentration: Good and Attention Span: Good  Recall:  Good  Fund of Knowledge: Good  Language: Good  Akathisia:  NA  Handed:  Right  AIMS (if indicated): not done  Assets:  Communication Skills Desire for Improvement Housing Social Support  ADL's:  Intact  Cognition: WNL  Sleep:  Good   Screenings: AUDIT   Flowsheet Row [facility]  Alcohol Use Disorder Identification Test Final Score (AUDIT) 0    GAD-7   Flowsheet Row Office Visit from 10/21/2020 in [facility] Video Visit from 07/22/2020 in [facility] Routine  Prenatal from 02/04/2016 in Center [facility]  Total GAD-7 Score 0 2 0    PHQ2-9   Flowsheet Row Office Visit from 10/21/2020 in [facility] Video Visit from 07/22/2020 in [facility] from 05/03/2016 in Center for [facility] Routine Prenatal from 04/12/2016 in Center for [facility] Routine Prenatal from 03/31/2016 in Center for [facility]  PHQ-2 Total Score 0 0 0 0 0  PHQ-9 Total Score -- -- 1 0 --    Flowsheet Row Office Visit from 10/21/2020 in [facility] Video Visit from 07/22/2020 in [facility]  C-SSRS RISK CATEGORY No Risk No Risk       Assessment and Plan:   Hadiya Spoerl is a 35 year old female with a past psychiatric history significant for schizophrenia who presents to [facility] for follow-up.  Due to language barrier, translation interpretive services were utilized during the encounter. Patient presents today to receive her Abilify Maintaina 400 mg injection.  Patient reports that her injection went well and has no major issues or complaints.  Patient to receive her next injection in roughly 4 weeks.  1. Schizophrenia, undifferentiated (HCC)  - ARIPiprazole ER (ABILIFY MAINTENA) 400 MG prefilled syringe 400 mg  Patient to follow-up in 4 weeks  Meta Hatchet, PA 10/21/2020, 4:02 PM

## 2020-10-21 NOTE — Progress Notes (Signed)
In as planned for her injection of Abilify M 400 mg given today in her L deltoid. She has had her shot changed per provider in coordination with her PCP because she would like to have more children and the Lorayne Bender was interfering with this, her prolactin levels were increased. She denies any concerns today other than last week she felt like she had a fever and a headache but she is feeling fine now. She has a Optometrist with her today and her youngest child and her husband are in the lobby waiting on her. She is to return in one month for her next injection. She met today with Eddie PA after getting her shot.

## 2020-10-27 LAB — OB RESULTS CONSOLE GBS
GBS: POSITIVE
GBS: POSITIVE

## 2020-11-17 ENCOUNTER — Telehealth (HOSPITAL_COMMUNITY): Payer: Self-pay | Admitting: Physician Assistant

## 2020-11-17 NOTE — Telephone Encounter (Signed)
Patient rescheduled tomorrow's appt (11/18/20) with provider and with nurse for Invega injection to next week 11/25/2020.

## 2020-11-18 ENCOUNTER — Ambulatory Visit (HOSPITAL_COMMUNITY): Payer: No Payment, Other

## 2020-11-18 ENCOUNTER — Encounter (HOSPITAL_COMMUNITY): Payer: No Payment, Other | Admitting: Physician Assistant

## 2020-11-25 ENCOUNTER — Ambulatory Visit (INDEPENDENT_AMBULATORY_CARE_PROVIDER_SITE_OTHER): Payer: 59 | Admitting: *Deleted

## 2020-11-25 ENCOUNTER — Telehealth (HOSPITAL_COMMUNITY): Payer: Self-pay | Admitting: *Deleted

## 2020-11-25 ENCOUNTER — Encounter (HOSPITAL_COMMUNITY): Payer: Self-pay

## 2020-11-25 ENCOUNTER — Ambulatory Visit (INDEPENDENT_AMBULATORY_CARE_PROVIDER_SITE_OTHER): Payer: 59 | Admitting: Physician Assistant

## 2020-11-25 ENCOUNTER — Other Ambulatory Visit: Payer: Self-pay

## 2020-11-25 VITALS — BP 126/81 | HR 89 | Ht 62.0 in | Wt 146.0 lb

## 2020-11-25 DIAGNOSIS — F203 Undifferentiated schizophrenia: Secondary | ICD-10-CM | POA: Diagnosis not present

## 2020-11-25 MED ORDER — ARIPIPRAZOLE ER 400 MG IM PRSY: 400.0000 mg | PREFILLED_SYRINGE | INTRAMUSCULAR | Status: AC

## 2020-11-25 MED ORDER — ARIPIPRAZOLE ER 400 MG IM PRSY
400.0000 mg | PREFILLED_SYRINGE | INTRAMUSCULAR | Status: AC
  Administered 2020-11-25 – 2021-02-17 (×4): 400 mg via INTRAMUSCULAR

## 2020-11-25 NOTE — Telephone Encounter (Signed)
Patient Insurance [employer] her ID# [id number] The spouse ID# is [id number]

## 2020-11-25 NOTE — Progress Notes (Signed)
Patient arrived  with interpreter late for appointment scheduled with provider & injection. Patient tolerated ARIPiprazole ER (ABILIFY MAINTENA) 400 MG injection well in  Right-Arm.  Patient is now taking Abilify Maintena  400 mg due to trying to get pregnant .

## 2020-11-28 ENCOUNTER — Encounter (HOSPITAL_COMMUNITY): Payer: Self-pay | Admitting: Physician Assistant

## 2020-11-28 NOTE — Progress Notes (Signed)
BH MD/PA/NP OP Progress Note  11/28/2020 10:33 PM Amy Mcclain  MRN:  [id number]  Chief Complaint: Follow up and medication management  HPI:   Amy Mcclain is a 35 year old female with a past psychiatric history significant for schizophrenia who presents to [facility] for follow-up.  Due to language barrier, translation interpretive services were utilized during the encounter.  Patient presents today to receive her Abilify Maintena 400 mg injection.  Patient reports that she may have to go back to her previous injection Eyvonne Mechanic) because of the cost of her Abilify Maintena injection.  Patient to receive her injection following the conclusion of the encounter.  Provider to speak with the nurse regarding potential patient assistant programs to allow for patient to continue taking Abilify Maintena.  Patient reports no issues or concerns regarding her mental health.  Patient denies experiencing any depressive symptoms or anxiety.  Patient denies any new stressors at this time.  Patient is pleasant, calm, cooperative, and fully engaged in conversation during the encounter.  Patient denies suicidal or homicidal ideations.  She further denies auditory or visual hallucinations and does not appear to be responding to internal/external stimuli.  Patient reports that she was not able to sleep well for 2 weeks but reports that her sleep schedule has normalized.  Patient endorses good sleep and receives on average 8 hours of sleep each night.  Patient endorses good appetite and eats on average 2 meals per day.  Patient denies alcohol consumption, tobacco use, and illicit drug use.  Visit Diagnosis:    ICD-10-CM   1. Schizophrenia, undifferentiated (HCC)  F20.3 ARIPiprazole ER (ABILIFY MAINTENA) 400 MG prefilled syringe 400 mg      Past Psychiatric History:  Schizophrenia Depression  Past Medical History:  Past Medical History:  Diagnosis Date   Anemia     Depression    Schizophrenia (HCC)     Past Surgical History:  Procedure Laterality Date   NO PAST SURGERIES      Family Psychiatric History:  Unknown  Family History: No family history on file.  Social History:  Social History   Socioeconomic History   Marital status: Married    Spouse name: Not on file   Number of children: Not on file   Years of education: Not on file   Highest education level: Not on file  Occupational History   Not on file  Tobacco Use   Smoking status: Never   Smokeless tobacco: Never  Substance and Sexual Activity   Alcohol use: No    Alcohol/week: 0.0 standard drinks   Drug use: No   Sexual activity: Yes  Other Topics Concern   Not on file  Social History Narrative   Not on file   Social Determinants of Health   Financial Resource Strain: Not on file  Food Insecurity: Not on file  Transportation Needs: Not on file  Physical Activity: Not on file  Stress: Not on file  Social Connections: Not on file    Allergies:  Allergies  Allergen Reactions   Ceftin [Cefuroxime Axetil] Rash    bruising    Metabolic Disorder Labs: Lab Results  Component Value Date   HGBA1C 4.8 10/18/2017   MPG 91.06 10/18/2017   Lab Results  Component Value Date   PROLACTIN 166.7 (H) 10/18/2017   Lab Results  Component Value Date   CHOL 168 10/18/2017   TRIG 60 10/18/2017   HDL 54 10/18/2017   CHOLHDL 3.1 10/18/2017   VLDL  12 10/18/2017   LDLCALC 102 (H) 10/18/2017   Lab Results  Component Value Date   TSH 1.713 10/18/2017    Therapeutic Level Labs: No results found for: LITHIUM No results found for: VALPROATE No components found for:  CBMZ  Current Medications: Current Outpatient Medications  Medication Sig Dispense Refill   hydrOXYzine (ATARAX/VISTARIL) 50 MG tablet Take 1 tablet (50 mg total) by mouth every 6 (six) hours as needed for anxiety. 60 tablet 0   traZODone (DESYREL) 100 MG tablet Take 1 tablet (100 mg total) by mouth at  bedtime as needed for sleep. 30 tablet 0   Current Facility-Administered Medications  Medication Dose Route Frequency Provider Last Rate Last Admin   ARIPiprazole ER (ABILIFY MAINTENA) 400 MG prefilled syringe 400 mg  400 mg Intramuscular Q28 days Liliauna Santoni E, PA       ARIPiprazole ER (ABILIFY MAINTENA) 400 MG prefilled syringe 400 mg  400 mg Intramuscular Q28 days Corleone Biegler E, PA       ARIPiprazole ER (ABILIFY MAINTENA) 400 MG prefilled syringe 400 mg  400 mg Intramuscular Q28 days Alban Marucci E, PA   400 mg at 11/25/20 1200     Musculoskeletal: Strength & Muscle Tone: within normal limits Gait & Station: normal Patient leans: N/A  Psychiatric Specialty Exam: Review of Systems  Psychiatric/Behavioral:  Negative for decreased concentration, dysphoric mood, hallucinations, self-injury, sleep disturbance and suicidal ideas. The patient is not nervous/anxious and is not hyperactive.    unknown if currently breastfeeding.There is no height or weight on file to calculate BMI.  General Appearance: Well Groomed  Eye Contact:  Good  Speech:  Clear and Coherent and Normal Rate  Volume:  Normal  Mood:  Euthymic  Affect:  Appropriate  Thought Process:  Coherent and Descriptions of Associations: Intact  Orientation:  Full (Time, Place, and Person)  Thought Content: WDL   Suicidal Thoughts:  No  Homicidal Thoughts:  No  Memory:  Immediate;   Good Recent;   Good Remote;   Good  Judgement:  Good  Insight:  Good  Psychomotor Activity:  Normal  Concentration:  Concentration: Good and Attention Span: Good  Recall:  Good  Fund of Knowledge: Good  Language: Good  Akathisia:  NA  Handed:  Right  AIMS (if indicated): not done  Assets:  Communication Skills Desire for Improvement Housing Social Support  ADL's:  Intact  Cognition: WNL  Sleep:  Good   Screenings: AUDIT    Flowsheet Row [facility]   Alcohol Use Disorder Identification Test Final Score (AUDIT) 0      GAD-7    Flowsheet Row Clinical Support from 11/25/2020 in [facility] Office Visit from 10/21/2020 in [facility] Video Visit from 07/22/2020 in [facility] Routine Prenatal from 02/04/2016 in Center for [facility]  Total GAD-7 Score 1 0 2 0      PHQ2-9    Flowsheet Row Clinical Support from 11/25/2020 in [facility] Office Visit from 10/21/2020 in [facility] Video Visit from 07/22/2020 in [facility] from 05/03/2016 in Center for [facility] Routine Prenatal from 04/12/2016 in Center for [facility]  PHQ-2 Total Score 0 0 0 0 0  PHQ-9 Total Score -- -- -- 1 0      Flowsheet Row Clinical Support from 11/25/2020 in  [facility] Office Visit from 10/21/2020 in [facility] Video Visit from 07/22/2020 in [facility]  C-SSRS RISK CATEGORY No Risk No Risk No Risk        Assessment and Plan:   Dorothie Lightle is a 35 year old female with a past psychiatric history significant for schizophrenia who presents to [facility] for follow-up.  Due to language barrier, translation interpretive services were utilized during the encounter.  Patient presents today to receive her Abilify Maintena 400 mg injection.  Patient expresses concerns over the cost of her Abilify Maintena injection.  Provider informed patient that nurses would look into patient assistance programs so that she will be able to afford her medication.  Patient to receive her injection following the conclusion of the encounter.  1. Schizophrenia, undifferentiated (HCC)  - ARIPiprazole ER (ABILIFY  MAINTENA) 400 MG prefilled syringe 400 mg  Patient to follow up in 2 months Provider spent a total of 20 minutes with the patient  Meta Hatchet, PA 11/28/2020, 10:33 PM

## 2020-12-23 ENCOUNTER — Other Ambulatory Visit: Payer: Self-pay

## 2020-12-23 ENCOUNTER — Encounter (HOSPITAL_COMMUNITY): Payer: Self-pay

## 2020-12-23 ENCOUNTER — Ambulatory Visit (HOSPITAL_COMMUNITY): Payer: 59 | Admitting: *Deleted

## 2020-12-23 ENCOUNTER — Telehealth (HOSPITAL_COMMUNITY): Payer: Self-pay | Admitting: *Deleted

## 2020-12-23 VITALS — BP 107/69 | HR 91 | Ht 62.0 in | Wt 150.0 lb

## 2020-12-23 DIAGNOSIS — F203 Undifferentiated schizophrenia: Secondary | ICD-10-CM

## 2020-12-23 NOTE — Progress Notes (Signed)
In for her monthly injection of Abilify M 400 mg. She got her shot today in her L DELTOID without difficulty. She came back to clinic with her 36 yo son Ahmed, who speaks very good [place] and is bright and very talkative. He states his mom understands most of what is said but is not able to speak it as well. She is quiet, and he agrees she is quiet. She denies any issues, denies psychotic sx. She has good personal appearance and is appropriate. She is to return in one month.

## 2020-12-23 NOTE — Telephone Encounter (Signed)
Called [employer] to check on her MCD ID to see if it would cover her monthly Abilify M injection. I was told by [employer] she has mental health medicaid, it would pay for outpatient visits and inpatient stays but not her medicine as it does not include pharmacy benefits. Will have provider call rx to her pharmacy, she also shows [employer] and will do a PA when pharmacy tries to run the rx thru and her insurance will ask for a PA.

## 2021-01-20 ENCOUNTER — Encounter (HOSPITAL_COMMUNITY): Payer: Self-pay

## 2021-01-20 ENCOUNTER — Other Ambulatory Visit: Payer: Self-pay

## 2021-01-20 ENCOUNTER — Other Ambulatory Visit (HOSPITAL_COMMUNITY): Payer: Self-pay | Admitting: *Deleted

## 2021-01-20 ENCOUNTER — Ambulatory Visit (HOSPITAL_COMMUNITY): Payer: 59 | Admitting: *Deleted

## 2021-01-20 VITALS — BP 117/70 | HR 81 | Ht 62.0 in | Wt 153.0 lb

## 2021-01-20 DIAGNOSIS — F203 Undifferentiated schizophrenia: Secondary | ICD-10-CM

## 2021-01-20 NOTE — Progress Notes (Signed)
In as scheduled with her eldest daughter for her monthly injection of Abilify M 400 mg, given today in her R DELTOID without difficulty. Her daughter is so pleasant, intelligent and friendly she dominated the visit. She said her mom is shy and doesn't like to speak [place]. Patient denies any sx and offers no complaints. She has a nice personal appearance today. She is to return in one month for her next injection.

## 2021-01-27 ENCOUNTER — Encounter (HOSPITAL_COMMUNITY): Payer: 59 | Admitting: Physician Assistant

## 2021-01-27 ENCOUNTER — Other Ambulatory Visit (HOSPITAL_COMMUNITY): Payer: Self-pay | Admitting: Physician Assistant

## 2021-01-27 DIAGNOSIS — F203 Undifferentiated schizophrenia: Secondary | ICD-10-CM

## 2021-01-27 MED ORDER — ARIPIPRAZOLE ER 400 MG IM PRSY: 400.0000 mg | PREFILLED_SYRINGE | INTRAMUSCULAR | 6 refills | Status: DC

## 2021-01-27 NOTE — Progress Notes (Signed)
Patient's medication sent to preferred pharmacy for pick up.

## 2021-01-28 NOTE — Telephone Encounter (Signed)
Message acknowledged and reviewed by provider.

## 2021-02-03 ENCOUNTER — Telehealth (HOSPITAL_COMMUNITY): Payer: Self-pay | Admitting: *Deleted

## 2021-02-03 NOTE — Telephone Encounter (Signed)
Request from [employer] re supportive information to be sent to them re why she needs to take IM Abilify. Information sent. She was changed to this because her Prolactin Level was elevated and she wants to have additional children. The change in her medicine was made after PCP and Eddie PA discussed her treatment and plan going forward.

## 2021-02-04 ENCOUNTER — Telehealth (HOSPITAL_COMMUNITY): Payer: Self-pay | Admitting: *Deleted

## 2021-02-04 NOTE — Telephone Encounter (Signed)
[employer] MEDIMPACT DENIED  [employer] Aripiprazole ER (ABILIFY MAINTENA) 400 MG syringe

## 2021-02-17 ENCOUNTER — Other Ambulatory Visit: Payer: Self-pay

## 2021-02-17 ENCOUNTER — Ambulatory Visit (INDEPENDENT_AMBULATORY_CARE_PROVIDER_SITE_OTHER): Payer: 59 | Admitting: *Deleted

## 2021-02-17 ENCOUNTER — Encounter (HOSPITAL_COMMUNITY): Payer: Self-pay | Admitting: Family

## 2021-02-17 ENCOUNTER — Ambulatory Visit (INDEPENDENT_AMBULATORY_CARE_PROVIDER_SITE_OTHER): Payer: 59 | Admitting: Family

## 2021-02-17 VITALS — BP 114/75 | HR 86 | Ht 62.0 in | Wt 153.0 lb

## 2021-02-17 DIAGNOSIS — F203 Undifferentiated schizophrenia: Secondary | ICD-10-CM

## 2021-02-17 MED ORDER — ABILIFY MAINTENA 400 MG IM PRSY: 400.0000 mg | PREFILLED_SYRINGE | INTRAMUSCULAR | 11 refills | Status: DC

## 2021-02-17 MED ORDER — ARIPIPRAZOLE ER 400 MG IM PRSY: 400.0000 mg | PREFILLED_SYRINGE | Freq: Once | INTRAMUSCULAR | Status: DC

## 2021-02-17 NOTE — Progress Notes (Addendum)
BH MD/PA/NP OP Progress Note  02/17/2021 11:28 AM Amy Mcclain  MRN:  [id number]  Chief Complaint:  HPI:  Amy Mcclain is a 35 year old female seen today for follow-up psychiatric evaluation. 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  She has been managed historically on  Abilify Maintena 400mg  IM, monthly. She reports feeling that her mood is managed effectively by [facility]. She is not engaged in outpatient therapy and does not desire to enroll in talk therapy at this time. She has been prescribed hydroxyzine and trazodone, not currently using these medications x several months. Current home medication includes iron supplement only.   Amy Mcclain has recently applied for naturalization test, she has difficulty remembering medication and has difficulty with language. Patient's husband, [place], has concern regarding patient's ability to "learn information." Discussed locations offering adult psychometric testing including [facility].   Patient is assessed face-to-face by nurse practitioner, chart reviewed. Arabic interpreter, Rana interpreter identification [phone] assists with assessment. Patient's husband, Amy Mcclain remains present at patient request. Today patient is seated, no acute distress. She is appropriately groomed. She is alert and oriented, pleasant and cooperative. She has clear and coherent speech, average volume.  Behavior calm and appropriate, with fair eye contact.  She endorses euthymic mood with congruent affect.  Today provider conducted a GAD-7, patient scored a 0, at last visit he scored a 0. Patient scores 0 on PHQ-2, remains consistent from previous administration on 11/25/2020. Provider also conducted C-SSRS, no risk measured.   She denies suicidal and homicidal ideations currently.  She contracts verbally for safety with this 11/27/2020.   She denies both auditory and visual hallucinations.   There is no indication that she is responding to internal stimuli, no evidence of delusional thought content.  Patient is able to converse coherently with goal-directed thoughts and no distractibility or preoccupation. she denies paranoia.  Objectively there is no evidence of psychosis/mania or delusional thinking. Patient is insightful regarding treatment and diagnosis. Patient is tolerating medications with no adverse effects/reactions per her report.  Amy Mcclain resides in [place] with her husband and three children. She denies access to weapons. She is not employed outside of the home. She denies alcohol and substance use.   Patient reports average sleep,typically an average of 6 hours each night, She denies physical pain currently.    Patient provided support and encouragement.  Patient to receive monthly [facility] Abilify Maintena 400mg  monthly. Plan to follow-up in one month for Abilify Maintena monthly injection.  Patient reports average appetite. Reports weight gain, without trying, approximately 5 pounds over the last two months.    Visit Diagnosis:    ICD-10-CM   1. Schizophrenia, undifferentiated (HCC)  F20.3       Past Psychiatric History: schizophrenia undifferentiated, major depressive disorder  Past Medical History:  Past Medical History:  Diagnosis Date   Anemia    Depression    Schizophrenia (HCC)     Past Surgical History:  Procedure Laterality Date   NO PAST SURGERIES      Family Psychiatric History: none reported  Family History: History reviewed. No pertinent family history.  Social History:  Social History   Socioeconomic History   Marital status: Married    Spouse name: Not on file   Number of children: Not on file   Years of education: Not on file   Highest education level: Not on file  Occupational History   Not on file  Tobacco Use   Smoking status: Never  Smokeless tobacco: Never  Substance and Sexual Activity   Alcohol use: No     Alcohol/week: 0.0 standard drinks   Drug use: No   Sexual activity: Yes  Other Topics Concern   Not on file  Social History Narrative   Not on file   Social Determinants of Health   Financial Resource Strain: Not on file  Food Insecurity: Not on file  Transportation Needs: Not on file  Physical Activity: Not on file  Stress: Not on file  Social Connections: Not on file    Allergies:  Allergies  Allergen Reactions   Ceftin [Cefuroxime Axetil] Rash    bruising    Metabolic Disorder Labs: Lab Results  Component Value Date   HGBA1C 4.8 10/18/2017   MPG 91.06 10/18/2017   Lab Results  Component Value Date   PROLACTIN 166.7 (H) 10/18/2017   Lab Results  Component Value Date   CHOL 168 10/18/2017   TRIG 60 10/18/2017   HDL 54 10/18/2017   CHOLHDL 3.1 10/18/2017   VLDL 12 10/18/2017   LDLCALC 102 (H) 10/18/2017   Lab Results  Component Value Date   TSH 1.713 10/18/2017    Therapeutic Level Labs: No results found for: LITHIUM No results found for: VALPROATE No components found for:  CBMZ  Current Medications: Current Outpatient Medications  Medication Sig Dispense Refill   ARIPiprazole ER (ABILIFY MAINTENA) 400 MG PRSY prefilled syringe Inject 400 mg into the muscle every 28 (twenty-eight) days. 1 each 6   hydrOXYzine (ATARAX/VISTARIL) 50 MG tablet Take 1 tablet (50 mg total) by mouth every 6 (six) hours as needed for anxiety. 60 tablet 0   traZODone (DESYREL) 100 MG tablet Take 1 tablet (100 mg total) by mouth at bedtime as needed for sleep. 30 tablet 0   Current Facility-Administered Medications  Medication Dose Route Frequency Provider Last Rate Last Admin   ARIPiprazole ER (ABILIFY MAINTENA) 400 MG prefilled syringe 400 mg  400 mg Intramuscular Q28 days Nwoko, Uchenna E, PA       ARIPiprazole ER (ABILIFY MAINTENA) 400 MG prefilled syringe 400 mg  400 mg Intramuscular Q28 days Nwoko, Uchenna E, PA       ARIPiprazole ER (ABILIFY MAINTENA) 400 MG prefilled  syringe 400 mg  400 mg Intramuscular Q28 days Nwoko, Uchenna E, PA   400 mg at 02/17/21 1040     Musculoskeletal: Strength & Muscle Tone: within normal limits Gait & Station: normal Patient leans: N/A  Psychiatric Specialty Exam: Review of Systems  unknown if currently breastfeeding.There is no height or weight on file to calculate BMI.  General Appearance: Casual and Fairly Groomed  Eye Contact:  Fair  Speech:  Clear and Coherent and Normal Rate  Volume:  Normal  Mood:  Euthymic  Affect:  Appropriate and Congruent  Thought Process:  Coherent, Goal Directed, and Linear  Orientation:  Full (Time, Place, and Person)  Thought Content: WDL and Logical   Suicidal Thoughts:  No  Homicidal Thoughts:  No  Memory:  Immediate;   Good Recent;   Good Remote;   Good  Judgement:  Good  Insight:  Fair  Psychomotor Activity:  Normal  Concentration:  Concentration: Good and Attention Span: Good  Recall:  Good  Fund of Knowledge: Good  Language: Good  Akathisia:  No  Handed:  Right  AIMS (if indicated): not done  Assets:  Communication Skills Desire for Improvement Financial Resources/Insurance Housing Intimacy Leisure Time Physical Health Resilience Social Support  ADL's:  Intact  Cognition: WNL  Sleep:  Good   Screenings: AUDIT    Flowsheet Row [facility]  Alcohol Use Disorder Identification Test Final Score (AUDIT) 0      GAD-7    Flowsheet Row Office Visit from 02/17/2021 in [facility] Office Visit from 11/25/2020 in [facility] Office Visit from 10/21/2020 in [facility] Video Visit from 07/22/2020 in [facility] Routine Prenatal from 02/04/2016 in [facility]  Total GAD-7 Score 0 1 0 2 0      PHQ2-9    Flowsheet Row Office Visit from 02/17/2021 in  [facility] Office Visit from 11/25/2020 in [facility] Office Visit from 10/21/2020 in [facility] Video Visit from 07/22/2020 in [facility] from 05/03/2016 in Center [facility]  PHQ-2 Total Score 0 0 0 0 0  PHQ-9 Total Score -- -- -- -- 1      Flowsheet Row Office Visit from 02/17/2021 in [facility] Office Visit from 11/25/2020 in [facility] Office Visit from 10/21/2020 in [facility]  C-SSRS RISK CATEGORY No Risk No Risk No Risk        Assessment and Plan: Patient remains stable on Abilify Maintena 400mg  IM every 28 days. Follow up in approximately one month for medication administration. Order/referral faxed to [facility].    , FNP 02/17/2021, 11:28 AM

## 2021-02-17 NOTE — Progress Notes (Signed)
Patient arrived with husband for injection. Patient's husband has some questions & wanted to speak with a provider.  F.N.P. TINA spoke with husband & patient. ARIPiprazole ER (ABILIFY MAINTENA) 400 MG  injection tolerated well in Right-Arm

## 2021-02-20 ENCOUNTER — Telehealth (HOSPITAL_COMMUNITY): Payer: Self-pay | Admitting: *Deleted

## 2021-02-20 NOTE — Telephone Encounter (Signed)
Received a fax this am that she was declined services at [facility] due to them not accepting her insurance. Her husband had wanted her to be evaluated for a learning disability and not understanding english well enough to pass the naturalization test.

## 2021-03-17 ENCOUNTER — Encounter (HOSPITAL_COMMUNITY): Payer: Self-pay

## 2021-03-17 ENCOUNTER — Ambulatory Visit (HOSPITAL_COMMUNITY): Payer: 59 | Admitting: *Deleted

## 2021-03-17 ENCOUNTER — Other Ambulatory Visit: Payer: Self-pay

## 2021-03-17 NOTE — Progress Notes (Signed)
Pt and husband very pleasant

## 2021-04-14 ENCOUNTER — Ambulatory Visit (HOSPITAL_COMMUNITY): Payer: 59

## 2021-04-14 LAB — OB RESULTS CONSOLE HEPATITIS B SURFACE ANTIGEN
Hepatitis B Surface Ag: NEGATIVE
Hepatitis B Surface Ag: NEGATIVE

## 2021-04-14 LAB — OB RESULTS CONSOLE RUBELLA ANTIBODY, IGM: Rubella: IMMUNE

## 2021-04-14 LAB — HEPATITIS C ANTIBODY: HCV Ab: NEGATIVE

## 2021-04-14 LAB — OB RESULTS CONSOLE HIV ANTIBODY (ROUTINE TESTING): HIV: NONREACTIVE

## 2021-04-29 LAB — OB RESULTS CONSOLE GC/CHLAMYDIA
Chlamydia: NEGATIVE
Neisseria Gonorrhea: NEGATIVE

## 2021-05-14 ENCOUNTER — Encounter (HOSPITAL_COMMUNITY): Payer: 59 | Admitting: Physician Assistant

## 2021-05-31 NOTE — L&D Delivery Note (Signed)
OB/GYN Faculty Practice Delivery Note  Amy Mcclain is a 36 y.o. [id number] s/p SVD at [redacted]w[redacted]d. She was admitted for spontaneous vaginal delivery in the car as her husband pulled into the [place] [facility] entrance.   ROM: en caul delivery GBS Status: positive - untreated Maximum Maternal Temperature: 98.6  Labor Progress: FOB pulled into [facility] entrance C and alerted staff he needed help. When I presented to the car, baby was out and breathing well with good color and tone. No copious blood noted in car or on pt, placenta not delivered and cord still full of blood/pulsing Covered baby in blankets and helped pt transfer to wheelchair, stable for direct admission to L&D. No interpreter present at entrance so we did not know her OB provider.  Delivery Date/Time: 11/17/21 at 0520 Delivery: Once in room, baby transferred to warmer so we could get pt undressed and deliver placenta (placental release of blood noted at this time). Cord clamped and cut by myself. Cord blood drawn and cord sample collected.  Placenta delivered spontaneously, intact, with 3-vessel cord. Fundus firm with massage and IM Pitocin. Labia, perineum, vagina, and cervix inspected, no laceration found.  Placenta: spontaneous, 3-vessel -> to L&D Complications: precipitous delivery without adequate GBS treatment Lacerations: none EBL: 50 Analgesia: none  Postpartum Planning Report given/care turned over to Rhea Pink, CNM.  Infant: Boy  APGARs 9/9  3580g  Edd Arbour, CNM, IBCLC Certified Nurse Midwife, [facility] for [employer], [facility] 11/17/2021, 7:33 AM

## 2021-07-13 ENCOUNTER — Other Ambulatory Visit: Payer: Self-pay

## 2021-07-13 ENCOUNTER — Other Ambulatory Visit: Payer: Self-pay | Admitting: Obstetrics and Gynecology

## 2021-07-13 DIAGNOSIS — Z363 Encounter for antenatal screening for malformations: Secondary | ICD-10-CM

## 2021-07-15 ENCOUNTER — Ambulatory Visit: Payer: Medicaid Other | Admitting: *Deleted

## 2021-07-15 ENCOUNTER — Encounter: Payer: Self-pay | Admitting: *Deleted

## 2021-07-15 ENCOUNTER — Ambulatory Visit: Payer: Medicaid Other

## 2021-07-15 ENCOUNTER — Other Ambulatory Visit: Payer: Self-pay

## 2021-07-15 ENCOUNTER — Ambulatory Visit (HOSPITAL_BASED_OUTPATIENT_CLINIC_OR_DEPARTMENT_OTHER): Payer: Medicaid Other | Admitting: Obstetrics and Gynecology

## 2021-07-15 ENCOUNTER — Ambulatory Visit: Payer: Medicaid Other | Attending: Obstetrics and Gynecology

## 2021-07-15 VITALS — BP 128/69 | HR 113

## 2021-07-15 DIAGNOSIS — Z3A21 21 weeks gestation of pregnancy: Secondary | ICD-10-CM | POA: Insufficient documentation

## 2021-07-15 DIAGNOSIS — Z363 Encounter for antenatal screening for malformations: Secondary | ICD-10-CM | POA: Insufficient documentation

## 2021-07-15 DIAGNOSIS — O09522 Supervision of elderly multigravida, second trimester: Secondary | ICD-10-CM

## 2021-07-15 DIAGNOSIS — O285 Abnormal chromosomal and genetic finding on antenatal screening of mother: Secondary | ICD-10-CM | POA: Diagnosis present

## 2021-07-15 DIAGNOSIS — O280 Abnormal hematological finding on antenatal screening of mother: Secondary | ICD-10-CM | POA: Insufficient documentation

## 2021-07-15 NOTE — Progress Notes (Signed)
Maternal-Fetal Medicine   Name: Amy Mcclain DOB: December 14, 1985 MRN: [id number] Referring Provider: Christophe Louis, MD  I had the pleasure of seeing [name] today at the [facility]. She was accompanied by her husband.  She is G5 P3103 at 21w 4d gestation and is here for ultrasound evaluation and genetic counseling.  I obtained history and counseled the couple with help of Arabic language interpreter present in the room. Advanced maternal age.  On tetra screening, the risk for Down syndrome was increased at 1 and 162. Obstetric history significant for 3 term vaginal deliveries and a preterm vaginal delivery in 2012 at [redacted] weeks gestation and the infant died in the neonatal period. Patient reports no chronic medical conditions.  Ultrasound We performed a fetal anatomical survey.  Amniotic fluid is normal and good fetal activity seen.  No markers of aneuploidies or fetal structural defects are seen.  Fetal biometry is consistent with the previously established dates.  I counseled the couple on the following Increased risk for Down syndrome on tetra screening -Advanced maternal age is associated with increased risk of fetal aneuploidies including Down syndrome. -I discussed the significance of tetra screening results.  I discussed the option of cell free fetal DNA screening that has a greater detection for Down syndrome that tetra screening.  I informed that only amniocentesis will give a definitive result on the fetal karyotype. -I explained the procedure of amniocentesis and possible complication of miscarriage (1 and 500 procedures). -She was made aware that ultrasound has limitations in identifying anomalies and some babies with Down syndrome may not show any markers.  After ultrasound, the couple met with our genetic counselor.  Patient opted to have cell free fetal DNA screening.  Blood was drawn today at her office for cell free fetal DNA screening.  We will communicate the results  to the patient and fax copies to your office.  Recommendations -Fetal growth assessment at [redacted] weeks gestation.  Thank you for consultation.  If you have any questions or concerns, please contact me the [facility].  Consultation including face-to-face (more than 50%) counseling 30 minutes.

## 2021-07-15 NOTE — Progress Notes (Signed)
Name: Amy Mcclain Indication: Quad screen indicating increased risk for Down syndrome in the current pregnancy (1 in 15)  DOB: Jun 12, 1985 Age: 36 y.o.   EDC: 11/21/2021 LMP: 02/14/2021 Referring Provider:  Christophe Louis, MD  EGA: [redacted]w[redacted]d Genetic Counselor: Staci Righter, MS, CGC  OB HxOH:[id number] Date of Appointment: 07/15/2021  Accompanied by: Interpreter Face to Face Time: 30 Minutes   Medical & Family History:  This is Ginnifer's 5th pregnancy. She has 3 living children. Her son was born prematurely and passed away in infancy. Reports she takes prenatal vitamins and iron. Denies personal history of diabetes, high blood pressure, thyroid conditions, and seizures. Denies bleeding, infections, and fevers in this pregnancy. Denies using tobacco, alcohol, or street drugs in this pregnancy.  Maternal ethnicity reported as 50 African and paternal ethnicity reported as 36 African. Denies consanguinity. Rosela denied a family history of chromosome conditions, intellectual disability, autism, birth defects, bone/skeletal disorders, blindness, deafness, blood disorders, neuromuscular disorders, still births, early infant deaths, and 3 or more pregnancy losses for one person on her prenatal intake questionnaire.      Genetic Counseling:   Quad screening indicating increased chance for Down syndrome. Shailey's age-related risk to have a baby with Down syndrome was 1 in 189. Based on the high-risk quad screening results, the updated risk for the current pregnancy to be affected with Down syndrome is now 1 in 162. If the current pregnancy is found to have Down syndrome it most likely does not have an inherited form of Down syndrome, rather it most likely occurred by chance during sperm or egg formation (nondisjunction). Common manifestations individuals with Down syndrome have include mild to moderate intellectual disability, delayed achievement of milestones, poor muscle tone, heart defects, etc. All  individuals with Down syndrome benefit from early intervention to help with the development of their speech as well as their fine- and gross-motor skills. Additionally, all individuals with Down syndrome need life-long care and assistance. Given the high risk quad screen result, genetic counseling offered Rayann the options of NIPS and amniocentesis for further information. Maeven accepted NIPS and declined amniocentesis at this time.    Birth Defects. All babies have approximately a 3-5% risk for a birth defect and a majority of these defects cannot be detected through the screening or diagnostic testing listed below. Ultrasound may detect some birth defects, but it may not detect all birth defects. About half of pregnancies with Down syndrome do not show any soft markers on ultrasound. A normal ultrasound does not guarantee a healthy pregnancy.   Testing/Screening Options:   Amniocentesis. This procedure is available for prenatal diagnosis. Possible procedural difficulties and complications that can arise include maternal infection, cramping, bleeding, fluid leakage, and/or pregnancy loss. The risk for pregnancy loss with an amniocentesis is 1/500. Per the [employer] of [employer]stetricians and Gynecologists (ACOG) Practice Bulletin 162, all pregnant women should be offered prenatal assessment for aneuploidy by diagnostic testing regardless of maternal age or other risk factors. If indicated, genetic testing that could be ordered on an amniocentesis sample includes a fetal karyotype, fetal microarray, and testing for specific syndromes.  Non-invasive prenatal screening (NIPS). This can screen the pregnancy for aneuploidy involving chromosomes 13, 18, 21, X, and Y. If NIPS results indicate high-risk for a chromosomal aneuploidy, prenatal diagnosis via CVS or amniocentesis would be recommended. A low-risk NIPS result does not ensure an unaffected pregnancy. NIPS does not screen for neural tube defects or  other genetic conditions. Per the ACOG Practice Bulletin [id number]  all pregnant women regardless of age, baseline risk, and number of fetuses should be offered all screening options including NIPS which was previously only offered for singleton high risk pregnancies. It is important to note that there is a 2% residual risk for aneuploidy with a screen negative NIPS following a positive serum screen.     Patient Plan:  Proceed with: NIPS Informed consent was obtained. All questions were answered.  Declined: Amniocentesis   Thank you for sharing in the care of [place] with [place].  Please do not hesitate to contact us if you have any questions.  Staci Righter, MS, [facility]

## 2021-07-27 ENCOUNTER — Telehealth: Payer: Self-pay | Admitting: Genetics

## 2021-07-27 NOTE — Telephone Encounter (Signed)
Called Amy Mcclain to return low risk NIPS results. Was not able to leave a voicemail because her mailbox has not been set up yet.

## 2021-07-30 ENCOUNTER — Other Ambulatory Visit: Payer: Self-pay

## 2021-09-02 LAB — OB RESULTS CONSOLE RPR: RPR: REACTIVE

## 2021-10-27 LAB — OB RESULTS CONSOLE GBS: GBS: POSITIVE

## 2021-11-17 ENCOUNTER — Other Ambulatory Visit: Payer: Self-pay

## 2021-11-17 ENCOUNTER — Encounter (HOSPITAL_COMMUNITY): Payer: Self-pay | Admitting: Obstetrics and Gynecology

## 2021-11-17 ENCOUNTER — Inpatient Hospital Stay (HOSPITAL_COMMUNITY)
Admission: AD | Admit: 2021-11-17 | Discharge: 2021-11-19 | DRG: 806 | Disposition: A | Discharge status: Home / Self Care | Payer: Commercial Managed Care - HMO | Attending: Obstetrics and Gynecology | Admitting: Obstetrics and Gynecology

## 2021-11-17 DIAGNOSIS — E785 Hyperlipidemia, unspecified: Secondary | ICD-10-CM | POA: Diagnosis present

## 2021-11-17 DIAGNOSIS — O99824 Streptococcus B carrier state complicating childbirth: Secondary | ICD-10-CM | POA: Diagnosis present

## 2021-11-17 DIAGNOSIS — O26873 Cervical shortening, third trimester: Secondary | ICD-10-CM | POA: Diagnosis present

## 2021-11-17 DIAGNOSIS — O09523 Supervision of elderly multigravida, third trimester: Secondary | ICD-10-CM | POA: Diagnosis present

## 2021-11-17 DIAGNOSIS — F203 Undifferentiated schizophrenia: Secondary | ICD-10-CM

## 2021-11-17 DIAGNOSIS — Z3A39 39 weeks gestation of pregnancy: Secondary | ICD-10-CM

## 2021-11-17 DIAGNOSIS — O99820 Streptococcus B carrier state complicating pregnancy: Secondary | ICD-10-CM | POA: Diagnosis not present

## 2021-11-17 LAB — ABO/RH: ABO/RH(D): A POS

## 2021-11-17 LAB — CBC WITH DIFFERENTIAL/PLATELET
Abs Immature Granulocytes: 0.07 10*3/uL (ref 0.00–0.07)
Basophils Absolute: 0.1 10*3/uL (ref 0.0–0.1)
Basophils Relative: 0 %
Eosinophils Absolute: 0 10*3/uL (ref 0.0–0.5)
Eosinophils Relative: 0 %
HCT: 37.5 % (ref 36.0–46.0)
Hemoglobin: 12.3 g/dL (ref 12.0–15.0)
Immature Granulocytes: 0 %
Lymphocytes Relative: 8 %
Lymphs Abs: 1.4 10*3/uL (ref 0.7–4.0)
MCH: 31 pg (ref 26.0–34.0)
MCHC: 32.8 g/dL (ref 30.0–36.0)
MCV: 94.5 fL (ref 80.0–100.0)
Monocytes Absolute: 1.1 10*3/uL — ABNORMAL HIGH (ref 0.1–1.0)
Monocytes Relative: 6 %
Neutro Abs: 14.7 10*3/uL — ABNORMAL HIGH (ref 1.7–7.7)
Neutrophils Relative %: 86 %
Platelets: 236 10*3/uL (ref 150–400)
RBC: 3.97 MIL/uL (ref 3.87–5.11)
RDW: 13.2 % (ref 11.5–15.5)
WBC: 17.3 10*3/uL — ABNORMAL HIGH (ref 4.0–10.5)
nRBC: 0 % (ref 0.0–0.2)

## 2021-11-17 LAB — RPR
RPR Ser Ql: REACTIVE — AB
RPR Titer: 1:1 {titer}

## 2021-11-17 MED ORDER — IBUPROFEN 600 MG PO TABS
600.0000 mg | ORAL_TABLET | Freq: Four times a day (QID) | ORAL | Status: DC
  Administered 2021-11-17 – 2021-11-19 (×9): 600 mg via ORAL
  Filled 2021-11-17 (×8): qty 1

## 2021-11-17 MED ORDER — TETANUS-DIPHTH-ACELL PERTUSSIS 5-2.5-18.5 LF-MCG/0.5 IM SUSY: 0.5000 mL | PREFILLED_SYRINGE | Freq: Once | INTRAMUSCULAR | Status: DC

## 2021-11-17 MED ORDER — ACETAMINOPHEN 325 MG PO TABS: 650.0000 mg | ORAL_TABLET | ORAL | Status: DC | PRN

## 2021-11-17 MED ORDER — DIBUCAINE (PERIANAL) 1 % EX OINT: 1.0000 | TOPICAL_OINTMENT | CUTANEOUS | Status: DC | PRN

## 2021-11-17 MED ORDER — WITCH HAZEL-GLYCERIN EX PADS: 1.0000 | MEDICATED_PAD | CUTANEOUS | Status: DC | PRN

## 2021-11-17 MED ORDER — ZOLPIDEM TARTRATE 5 MG PO TABS: 5.0000 mg | ORAL_TABLET | Freq: Every evening | ORAL | Status: DC | PRN

## 2021-11-17 MED ORDER — OXYCODONE HCL 5 MG PO TABS: 10.0000 mg | ORAL_TABLET | ORAL | Status: DC | PRN

## 2021-11-17 MED ORDER — OXYTOCIN 10 UNIT/ML IJ SOLN: 10.0000 [IU] | Freq: Once | INTRAMUSCULAR | Status: AC

## 2021-11-17 MED ORDER — OXYCODONE HCL 5 MG PO TABS: 5.0000 mg | ORAL_TABLET | ORAL | Status: DC | PRN

## 2021-11-17 MED ORDER — SENNOSIDES-DOCUSATE SODIUM 8.6-50 MG PO TABS
2.0000 | ORAL_TABLET | ORAL | Status: DC
Start: 2021-11-17 — End: 2021-11-19
  Administered 2021-11-17 – 2021-11-19 (×3): 2 via ORAL
  Filled 2021-11-17 (×3): qty 2

## 2021-11-17 MED ORDER — ONDANSETRON HCL 4 MG/2ML IJ SOLN: 4.0000 mg | INTRAMUSCULAR | Status: DC | PRN

## 2021-11-17 MED ORDER — PRENATAL MULTIVITAMIN CH
1.0000 | ORAL_TABLET | Freq: Every day | ORAL | Status: DC
Start: 2021-11-17 — End: 2021-11-19
  Administered 2021-11-17 – 2021-11-19 (×3): 1 via ORAL
  Filled 2021-11-17 (×2): qty 1

## 2021-11-17 MED ORDER — COCONUT OIL OIL: 1.0000 | TOPICAL_OIL | Status: DC | PRN

## 2021-11-17 MED ORDER — DIPHENHYDRAMINE HCL 25 MG PO CAPS: 25.0000 mg | ORAL_CAPSULE | Freq: Four times a day (QID) | ORAL | Status: DC | PRN

## 2021-11-17 MED ORDER — ONDANSETRON HCL 4 MG PO TABS: 4.0000 mg | ORAL_TABLET | ORAL | Status: DC | PRN

## 2021-11-17 MED ORDER — BENZOCAINE-MENTHOL 20-0.5 % EX AERO: 1.0000 | INHALATION_SPRAY | CUTANEOUS | Status: DC | PRN

## 2021-11-17 MED ORDER — SIMETHICONE 80 MG PO CHEW: 80.0000 mg | CHEWABLE_TABLET | ORAL | Status: DC | PRN

## 2021-11-17 MED ORDER — OXYTOCIN 10 UNIT/ML IJ SOLN
INTRAMUSCULAR | Status: AC
  Administered 2021-11-17: 10 [IU] via INTRAMUSCULAR
  Filled 2021-11-17: qty 1

## 2021-11-17 NOTE — Lactation Note (Signed)
This note was copied from a baby's chart. Lactation Consultation Note Mom would like to see Lactation but wants to wait until she gets to [facility]. Mom is still over whelmed by the car delivery.  Patient Name: Amy Mcclain SLPNP'Y Date: 11/17/2021   Age:37 hours  Maternal Data    Feeding    LATCH Score                    Lactation Tools Discussed/Used    Interventions    Discharge    Consult Status      Charyl Dancer 11/17/2021, 5:58 AM

## 2021-11-17 NOTE — Lactation Note (Signed)
This note was copied from a baby's chart. Lactation Consultation Note  Patient Name: Amy Mcclain [id number] Date: 11/17/2021 Reason for consult: Initial assessment;Term Age:37 hours  37 yr old daughter translated on Mom's phone.  LC in to visit with P4 Mom of term baby delivered in car.  Mom is an experienced breastfeeder with her 3 other children.    Baby loosely swaddled in crib.  Baby quiet alert.  Mom reports that baby has latched to breast and fed well twice since birth.    Reviewed hand expression, colostrum easily expressed.   Encouraged holding baby STS at least every 3hrs to encourage breastfeeding.  When baby shows feeding cues, Mom should offer the breast.    Mom denies any questions.  Encouraged Mom to call prn for help  Maternal Data Has patient been taught Hand Expression?: Yes Does the patient have breastfeeding experience prior to this delivery?: Yes How long did the patient breastfeed?: 2 years with previous 3 babies  Feeding Mother's Current Feeding Choice: Breast Milk and Formula  Interventions Interventions: Breast feeding basics reviewed;Skin to skin;Breast massage;Hand express;LC Services brochure Consult Status Consult Status: Follow-up Date: 11/18/21 Follow-up type: In-patient    Amy Mcclain 11/17/2021, 10:09 AM

## 2021-11-17 NOTE — H&P (Addendum)
OB ADMISSION/ HISTORY & PHYSICAL:  Admission Date: 11/17/2021  5:29 AM  Admit Diagnosis: Normal labor and delivery  Amy Mcclain is a 36 y.o. female [phone] [redacted]w[redacted]d presents after precipitously delivering a live female in her car en route to hospital with an undelivered placenta. Delivery of placenta and immediate postpartum care provided by faculty staff. This CNM was informed of pt's arrival and status after admission. See delivery note for details. Pregnancy complicated by AMA, shortening of cervix-managed on Prometrium, and  schizophrenia- hx of management w/ Abilify IM Q28 days and Trazodone.   History of current pregnancy: [id number]   Primary OB Provider: Dr. Richardson Dopp Patient entered care with [facility] at 10+4 wks.   EDC 6/24 by first trimester [place]    Significant prenatal events:  Patient Active Problem List   Diagnosis Date Noted   Normal labor and delivery 11/17/2021   AMA (advanced maternal age) multigravida 35+, third trimester 11/17/2021   Hyperlipidemia 11/17/2021   Language barrier affecting health care 24-Feb-2016   Neonatal death, PTB @ 24wks 2015/10/29   Severe recurrent major depression with psychotic features (HCC) 08/18/2015   Schizophrenia, unspecified (HCC)     Prenatal Labs: ABO, Rh:  pending Antibody:  pending Rubella: Immune (11/15 0000)  RPR: Reactive (04/05 0000)  HBsAg: Negative, Negative (11/15 0000)  HIV: Non-reactive (11/15 0000)  GTT: normal 1 hr GBS: Positive/-- 29-Oct-2022 0000)  GC/CHL: neg/neg Genetics: AFP 1:162 risk for down syndrome Tdap/influenza vaccines: tdap current   OB History  Gravida Para Term Preterm AB Living  5 5 4 1  0 4  SAB IAB Ectopic Multiple Live Births  0 0 0 0 5    # Outcome Date GA Lbr Len/2nd Weight Sex Delivery Anes PTL Lv  5 Term 11/17/21 [redacted]w[redacted]d 00:20 3580 g M Vag-Spont None  LIV  4 Term 04/20/16 [redacted]w[redacted]d / 00:14 3500 g F Vag-Spont Local  LIV  3 Preterm 2012 [redacted]w[redacted]d      Y ND  2 Term 09/21/07    M Vag-Spont Local N LIV  1 Term  06/16/05    F Vag-Spont Local N LIV    Medical / Surgical History: Past medical history:  Past Medical History:  Diagnosis Date   Anemia    Depression    Schizophrenia (HCC)     Past surgical history:  Past Surgical History:  Procedure Laterality Date   NO PAST SURGERIES     Family History:  Family History  Problem Relation Age of Onset   Hypertension Mother    Diabetes Mother    Diabetes Father    Hypertension Father     Social History:  reports that she has never smoked. She has never used smokeless tobacco. She reports that she does not drink alcohol and does not use drugs.  Allergies: Ceftin [cefuroxime axetil]   Current Medications at time of admission:  Prior to Admission medications   Medication Sig Start Date End Date Taking? Authorizing Provider  ARIPiprazole ER (ABILIFY MAINTENA) 400 MG PRSY prefilled syringe Inject 400 mg into the muscle every 28 (twenty-eight) days. Patient not taking: Reported on 07/15/2021 01/27/21   01/29/21 E, PA  ARIPiprazole ER (ABILIFY MAINTENA) 400 MG PRSY prefilled syringe Inject 400 mg into the muscle every 28 (twenty-eight) days. Patient not taking: Reported on 07/15/2021 02/17/21   02/19/21, FNP  ferrous sulfate 325 (65 FE) MG tablet Take 325 mg by mouth daily with breakfast.    [provider]  hydrOXYzine (ATARAX/VISTARIL) 50 MG  tablet Take 1 tablet (50 mg total) by mouth every 6 (six) hours as needed for anxiety. Patient not taking: Reported on 07/15/2021 10/21/17   Armandina Stammer I, NP  Prenatal Vit-Fe Fumarate-FA (PRENATAL MULTIVITAMIN) TABS tablet Take 1 tablet by mouth daily at 12 noon.    [provider]  traZODone (DESYREL) 100 MG tablet Take 1 tablet (100 mg total) by mouth at bedtime as needed for sleep. Patient not taking: Reported on 07/15/2021 10/21/17   Armandina Stammer I, NP    Review of Systems: Constitutional: Negative   HENT: Negative   Eyes: Negative   Respiratory: Negative   Cardiovascular:  Negative   Gastrointestinal: Negative  Musculoskeletal: Negative   Skin: Negative   Neurological: Negative   Endo/Heme/Allergies: Negative   Psychiatric/Behavioral: Negative    Physical Exam: VS: Blood pressure 128/83, pulse 88, temperature 98.6 F (37 C), temperature source Oral, resp. rate 17, height 5\' 4"  (1.626 m), weight 77.1 kg, last menstrual period 02/14/2021, unknown if currently breastfeeding. AAO x3, no signs of distress Cardiovascular: deferred Respiratory: deferred GU/GI: Abdomen nongravid, non-tender, non-distended Extremities: no edema, negative for pain, tenderness, and cords  Cervical exam: delivered on admission FHR: delivered on admission    Prenatal Transfer Tool  Maternal Diabetes: No Genetic Screening: Abnormal:  Results: Elevated AFP Maternal Ultrasounds/Referrals: Normal Fetal Ultrasounds or other Referrals:  None Maternal Substance Abuse:  No Significant Maternal Medications:  Meds include: Other: Abilify Significant Maternal Lab Results: Group B Strep positive    Assessment: 36 y.o. 30 [redacted]w[redacted]d  Precipitous labor and birth outside of hospital Stable GBS positive without prophylaxis  Plan:  Admit to L&D for initial PP care and transfer to [facility] floor Routine PP admission orders Dr [facility] notified of admission   Richardson Dopp DNP, CNM 11/17/2021 7:04 AM   Patient seen and exam this morning at 810 am. She is without complaints. Her daughter served as 11/19/2021. Patient denies heavy bleeding or pain. She is interested in circumcision of newborn prior to discharge from the hospital. We will discuss this tomorrow with arabic interpretor.   Routine postpartum care

## 2021-11-18 LAB — CBC
HCT: 35.9 % — ABNORMAL LOW (ref 36.0–46.0)
Hemoglobin: 12 g/dL (ref 12.0–15.0)
MCH: 31.4 pg (ref 26.0–34.0)
MCHC: 33.4 g/dL (ref 30.0–36.0)
MCV: 94 fL (ref 80.0–100.0)
Platelets: 228 10*3/uL (ref 150–400)
RBC: 3.82 MIL/uL — ABNORMAL LOW (ref 3.87–5.11)
RDW: 13.4 % (ref 11.5–15.5)
WBC: 13.4 10*3/uL — ABNORMAL HIGH (ref 4.0–10.5)
nRBC: 0 % (ref 0.0–0.2)

## 2021-11-18 LAB — T.PALLIDUM AB, TOTAL: T Pallidum Abs: NONREACTIVE

## 2021-11-18 NOTE — Progress Notes (Signed)
Pt seen and examined virtual arabic interpretor for encounter was Steward Ros [phone]  Post Partum Day 1 Subjective: no complaints, up ad lib, voiding, and tolerating PO   Objective: Blood pressure 124/71, pulse 86, temperature 98.5 F (36.9 C), temperature source Oral, resp. rate 18, height 5\' 4"  (1.626 m), weight 77.1 kg, last menstrual period 02/14/2021, SpO2 99 %, unknown if currently breastfeeding.  Physical Exam:  General: alert, cooperative, and no distress Lochia: appropriate Uterine Fundus: firm Incision: NA DVT Evaluation: No evidence of DVT seen on physical exam.  Recent Labs    11/17/21 0810 11/18/21 0413  HGB 12.3 12.0  HCT 37.5 35.9*    Assessment/Plan: Plan for discharge tomorrow, Breastfeeding, Lactation consult, and Circumcision prior to discharge  R/B/A of circumcision discussed with parents. They are advised that it is not medically necessary. It is for cosmetic purposes only. R/O cirmcumcision include but are not limited to infection, bleeding , damage to penis and poor cosmesis with the need for further surgery.    LOS: 1 day   11/20/21, MD 11/18/2021, 4:44 PM

## 2021-11-18 NOTE — Lactation Note (Signed)
This note was copied from a baby's chart. Lactation Consultation Note  Patient Name: Amy Mcclain XIPJA'S Date: 11/18/2021 Reason for consult: Follow-up assessment;Term Age:37 hours  P4 mother whose infant is now 96 hours old.  This is a term baby at 39+3 weeks.  Mother's current feeding preference is breast/formula.  Arabic interpreter, Tobi Bastos [phone]) used for interpretation.  Mother had no questions/concerns related to breast feeding.  Baby has been latching and mother has been supplementing with formula.  Father had a few questions related to formula feeding which I answered to his satisfaction.  Reviewed milk storage times once opened.    Mother will call as needed for latch assistance or questions.  RN updated.   Maternal Data    Feeding Mother's Current Feeding Choice: Breast Milk and Formula  LATCH Score                    Lactation Tools Discussed/Used    Interventions Interventions: Education  Discharge    Consult Status Consult Status: Follow-up Date: 11/19/21 Follow-up type: In-patient    Avni Traore R Kalob Bergen 11/18/2021, 3:22 PM

## 2021-11-18 NOTE — Lactation Note (Signed)
This note was copied from a baby's chart. Lactation Consultation Note Mom concerned that baby isn't wanting to BF. LC attempted to latch baby placing him on the breast. Baby wouldn't suck. Baby just isn't interested at this time. He is alert quiet and looking around. Newborn feeding habits and behavior reviewed. FOB interpret for mom. Mom did talk w/LC some in [place]. Encouraged to call for further assistance or questions.  Patient Name: Amy Mcclain KYHCW'C Date: 11/18/2021 Reason for consult: Mother's request;Term Age:2 hours  Maternal Data    Feeding    LATCH Score Latch: Too sleepy or reluctant, no latch achieved, no sucking elicited.  Audible Swallowing: None  Type of Nipple: Everted at rest and after stimulation  Comfort (Breast/Nipple): Soft / non-tender  Hold (Positioning): Assistance needed to correctly position infant at breast and maintain latch.  LATCH Score: 5   Lactation Tools Discussed/Used    Interventions Interventions: Assisted with latch;Skin to skin;Breast massage;Adjust position;Support pillows  Discharge    Consult Status Consult Status: Follow-up Date: 11/18/21 Follow-up type: In-patient    Charyl Dancer 11/18/2021, 12:40 AM

## 2021-11-18 NOTE — Clinical Social Work Maternal (Signed)
CLINICAL SOCIAL WORK MATERNAL/CHILD NOTE  Patient Details  Name: Amy Mcclain MRN: [id number] Date of Birth: [birth year]  Date:  11/18/2021  Clinical Social Worker Initiating Note:  Kathrin [place], [place] Date/Time: Initiated:  11/18/21/      Child's Name:  Amy Mcclain   Biological Parents:  Mother, Father (MOB: Amy Mcclain [birth year] 1985, FOB: Amy Mcclain 05[birth year]31[birth year]1973)   Need for Interpreter:  Arabic   Reason for Referral:  Behavioral Health Concerns   Address:  [address] [place] [address]    Phone number:  [phone] (home)     Additional phone number:   Household Members/Support Persons (HM/SP):   Household Member/Support Person 1, Household Member/Support Person 2, Household Member/Support Person 3, Household Member/Support Person 4   HM/SP Name Relationship DOB or Age  HM/SP -1 Keala Drum 05-31-1973  HM/SP -2 Rayan Ahmed Daughter 06-16-2005  HM/SP -3 Rital Ahmed Daughter 04-20-2016  HM/SP -4 Ahmed Ahmed Son 09-21-2007  HM/SP -5        HM/SP -6        HM/SP -7        HM/SP -8          Natural Supports (not living in the home):      Professional Supports: Other (Comment) [facility] center- monthly IM injection of Abilify)   Employment: Homemaker   Type of Work:     Education:  Programmer, systems   Homebound arranged:    Museum/gallery curator Resources:  Medicaid   Other Resources:      Cultural/Religious Considerations Which May Impact Care:    Strengths:  Ability to meet basic needs  , Home prepared for child  , Psychotropic Medications, Pediatrician chosen   Psychotropic Medications:  Abilify      Pediatrician:    [place] area  Pediatrician List:   [place] ([address])  [place]      Pediatrician Fax Number:    Risk Factors/Current Problems:  Mental Health Concerns     Cognitive State:  Able to Concentrate  ,  Insightful     Mood/Affect:  Comfortable  , Calm  , Happy  , Bright     CSW Assessment: CSW received consult for hx of undifferentiated Schizophrenia and Depression.  CSW met with MOB to offer support and complete assessment.    CSW met with MOB at bedside and introduced CSW role. CSW observed MOB up in bed on phone and the infant asleep in the bassinet upon arrival. CSW gestured to use the virtual interpreter however MOB preferred that her daughter " Rayan" interpret for her. MOB presented happy, bright, and welcomed CSW visit. MOB engaged well during the assessment. CSW asked MOB demographic questions. MOB reported that she lives at the address on file with her spouse and children. MOB reported that she had WIC/FS in the past but is not interested in applying again.   CSW asked MOB how she has felt since giving birth. MOB reported feeling good. CSW inquired about MOB mental health. MOB daughter could not translate the terms. CSW offered to use the virtual interpreter and MOB was agreeable. CSW used interpreter Deloris Ping [phone]. CSW inquired about MOB mental health history. MOB acknowledged that she has a history of schizophrenia and depression. MOB reported for the past nine months she has not taken the Abilify IM injection and has not followed  up with her mental health specialist because of the pregnancy. MOB expressed that she has been feeling good and denies having auditory of visual hallucinations. CSW inquired if MOB planned to restart the medication. MOB reported that she will restart the Abilify medication and shared sees a provider at [facility]. CSW offered counseling and therapy resources. MOB reported she is not interested in seeing a therapist at this time. CSW discussed PPD symptoms and encouraged the need to follow up with a mental health provider soon for treatment especially if concerns arise. CSW provided education regarding the baby blues period vs. perinatal  mood disorders. CSW recommended MOB complete a self-evaluation during the postpartum time period using the New Mom Checklist from Postpartum Progress (Arabic) and encouraged MOB to contact a medical professional if symptoms are noted at any time. CSW assessed MOB for safety. MOB denied thoughts of harm to self and others. CSW inquired about MOB supports. MOB identified her spouse, daughter and friends as supports.   MOB reported she has all items for the infant including a bassinet where the infant will sleep including a bassinet where the infant will sleep. CSW provided review of Sudden Infant Death Syndrome (SIDS) precautions. MOB has chosen [facility] where her other children go, for the infant's follow up care. CSW assessed MOB for additional needs and resources. MOB reported no further need.   CSW identifies no further need for intervention and no barriers to discharge at this time.  CSW Plan/Description:  Sudden Infant Death Syndrome (SIDS) Education, Perinatal Mood and Anxiety Disorder (PMADs) Education, No Further Intervention Required/No Barriers to Discharge    Lia Hopping, LCSW 11/18/2021, 12:30 PM

## 2021-11-18 NOTE — Progress Notes (Signed)
Asked patient if she wanted me to order lunch around 12pm  and she said she was not hungry at the moment. Patient verbalized understanding to use call bell when ready to order lunch. Patient's husband brought food around 2pm.

## 2021-11-19 MED ORDER — IBUPROFEN 600 MG PO TABS: 600.0000 mg | ORAL_TABLET | Freq: Four times a day (QID) | ORAL | 0 refills | Status: DC | PRN

## 2021-11-19 NOTE — Lactation Note (Signed)
This note was copied from a baby's chart. Lactation Consultation Note  Patient Name: Amy Mcclain PZWCH'E Date: 11/19/2021 Reason for consult: Follow-up assessment Age:37 hours   P4 mother whose infant is now 13 hours old.  This is a term baby at 39+3 weeks.  Mother's current feeding preference is breast/formula.  Arabic interpreter, Norwood Levo [phone]) used for interpretation.  Mother had no further questions/concerns since our visit yesterday.  She has been breast feeding and supplementing with formula.  Reviewed appropriate formula guidelines with mother.  She is familiar with engorgement and did not wish to review.  S  Mother has our OP phone number for concerns after discharge.  Father currently not present.  Baby has been discharged; informed mother of the discharge process.   Maternal Data    Feeding    LATCH Score Latch: Grasps breast easily, tongue down, lips flanged, rhythmical sucking.  Audible Swallowing: Spontaneous and intermittent  Type of Nipple: Everted at rest and after stimulation  Comfort (Breast/Nipple): Soft / non-tender  Hold (Positioning): No assistance needed to correctly position infant at breast.  LATCH Score: 10   Lactation Tools Discussed/Used    Interventions    Discharge Discharge Education: Engorgement and breast care  Consult Status Consult Status: Complete Date: 11/19/21 Follow-up type: Call as needed    Irene Pap Liviah Cake 11/19/2021, 9:57 AM

## 2022-08-27 ENCOUNTER — Ambulatory Visit (INDEPENDENT_AMBULATORY_CARE_PROVIDER_SITE_OTHER): Payer: BLUE CROSS/BLUE SHIELD | Admitting: Physician Assistant

## 2022-08-27 DIAGNOSIS — F203 Undifferentiated schizophrenia: Secondary | ICD-10-CM

## 2022-08-27 NOTE — Progress Notes (Unsigned)
Psychiatric Initial Adult Assessment   Patient Identification: Amy Mcclain MRN:  [id number] Date of Evaluation:  08/30/2022 Referral Source: N/A Chief Complaint:   Chief Complaint  Patient presents with   Other    Reestablish Care   Medication Management   Visit Diagnosis:    ICD-10-CM   1. 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  Patient was previously established with this facility and was originally taking Rolland Porter for the management of her schizophrenia.  Patient would eventually discontinued taking Rolland Porter due to the medication causing her to have elevated prolactin levels.  After a trial of Abilify dose orally, patient was placed on Abilify Maintena 400 mg long-acting injectable.  Patient eventually discontinued the [place] after becoming pregnant and her provider telling her to stop taking the medication.  She reports that her eldest daughter set up her appointment to resume her previous medication.  Patient reports that she is currently breast-feeding.  Per husband, patient has been experiencing the same symptoms she was experiencing before she was placed on a long-acting injectable.  He reports that he is worried about the patient hurting herself or the children.  Per her husband, patient is doing a lot of talking and states that she occasionally reports smelling blood.  He reports that the patient experiences depression but her symptoms occur randomly.  When asked about depression, patient denied depression and reports that she is feeling okay.  She reports that the only thing that annoys her is when her son hangs  out with friends older than him and stays out for long periods of time.  She reports that she is worried that other people may notice issues with her that she does not notice.  Patient denies anxiety.  Per husband, patient has been experiencing auditory hallucinations; however, patient does not believe that she has been experiencing auditory hallucinations.  She does admit to smelling things that are not there such as blood.  She also reports that she feels that her body is shrinking when taking her shower.  The patient describes that she feels that they are "lazer beams."  Causing her to shrink.  She reports that anytime that she sits down, she feels that she drinks.  Patient also notes experiencing an abnormal feeling on her forehead and back.  A GAD-7 screen was performed with the patient scoring a 5.  Patient is alert and oriented x 4, calm, cooperative, and engaged in conversation during the encounter.  Patient endorses normal mood.  Patient denied suicidal or homicidal ideation.  She further denies auditory or visual hallucinations at this time and does not appear to be responding to internal/external stimuli.  Patient endorses paranoia but denies delusional thoughts.  Per patient's husband, patient will sleep when she is not supposed to.  Patient endorses decreased appetite.  Patient denies alcohol consumption, tobacco use, and illicit drug use.  Associated Signs/Symptoms: Depression Symptoms:  insomnia, hypersomnia, difficulty concentrating, disturbed sleep, weight loss, decreased labido, decreased appetite, (Hypo) Manic Symptoms:  Delusions, Distractibility, Hallucinations, Impulsivity, Irritable Mood, Labiality of Mood, Anxiety Symptoms:  Agoraphobia, Social Anxiety, Psychotic Symptoms:  Delusions, Hallucinations: Auditory Olfactory Visual Paranoia, PTSD Symptoms: Had a traumatic exposure:  Police came and took  her post delivery in 2018. In 2012, back in [place], she was given  relaxing medicine and she woke up in the hospital. Had a traumatic exposure in the last month:  N/A Re-experiencing:  Unable to assess. Patient gives incoherent answers Hypervigilance:  Unable to assess Hyperarousal:  Unable to assess Avoidance:  None Unable to assess  Past Psychiatric History:  Patient has a past psychiatric history significant for schizoaffective disorder  Previous Psychotropic Medications: Yes . 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  Consequences of Substance Abuse: Negative  Past Medical History:  Past Medical History:  Diagnosis Date   Anemia    Depression    Schizophrenia ([place])     Past Surgical History:  Procedure Laterality Date   NO PAST SURGERIES      Family Psychiatric History:  Patient and her husband deny a family history of psychiatric illness.  Family history of suicide attempt: Patient denies Family history of homicide attempt: Patient denies Family history of substance abuse: Patient denies  Family History:  Family History  Problem Relation Age of Onset   Hypertension Mother    Diabetes Mother    Diabetes Father    Hypertension Father     Social History:   Social History   Socioeconomic History   Marital status: Married    Spouse name: Not on file   Number of children: Not on file   Years of education: Not on file   Highest education level: Not on file  Occupational History   Not on file  Tobacco Use   Smoking status: Never   Smokeless tobacco: Never  Vaping Use   Vaping Use: Never used  Substance and Sexual Activity   Alcohol use: No    Alcohol/week: 0.0 standard drinks of alcohol   Drug use: No   Sexual activity: Yes  Other Topics Concern   Not on file  Social History Narrative   Not on file   Social Determinants of Health   Financial Resource Strain: Not on file  Food Insecurity: Not on file  Transportation Needs: Not on file   Physical Activity: Not on file  Stress: Not on file  Social Connections: Not on file    Additional Social History:  Patient endorses social support. Patient has four children. Patient endorses housing. Patient denies employment. Patient denies a past history of military experience. Patient denies a past history of prison or jail time. Highest education earned by the patient is high school. Patient denies access to weapons.  Allergies:   Allergies  Allergen Reactions   Ceftin [Cefuroxime Axetil] Rash    bruising    Metabolic Disorder Labs: Lab Results  Component Value Date   HGBA1C 4.8 10/18/2017   MPG 91.06 10/18/2017   Lab Results  Component Value Date   PROLACTIN 166.7 (H) 10/18/2017   Lab Results  Component Value Date   CHOL 168 10/18/2017   TRIG 60 10/18/2017   HDL 54 10/18/2017   CHOLHDL 3.1 10/18/2017   VLDL 12 10/18/2017   LDLCALC 102 (H) 10/18/2017   Lab Results  Component Value Date   TSH 1.713 10/18/2017    Therapeutic Level Labs: No results found for: "LITHIUM" No results found for: "CBMZ" No results found for: "VALPROATE"  Current Medications: Current Outpatient Medications  Medication Sig Dispense Refill   ferrous sulfate 325 (65 FE) MG tablet Take 325 mg by mouth daily with breakfast.  hydrOXYzine (ATARAX/VISTARIL) 50 MG tablet Take 1 tablet (50 mg total) by mouth every 6 (six) hours as needed for anxiety. (Patient not taking: Reported on 07/15/2021) 60 tablet 0   ibuprofen (ADVIL) 600 MG tablet Take 1 tablet (600 mg total) by mouth every 6 (six) hours as needed. 30 tablet 0   Prenatal Vit-Fe Fumarate-FA (PRENATAL MULTIVITAMIN) TABS tablet Take 1 tablet by mouth daily at 12 noon.     No current facility-administered medications for this visit.    Musculoskeletal: Strength & Muscle Tone: within normal limits Gait & Station: normal Patient leans: N/A  Psychiatric Specialty Exam: Review of Systems  Psychiatric/Behavioral:  Positive for  agitation, decreased concentration and sleep disturbance. Negative for dysphoric mood, hallucinations, self-injury and suicidal ideas. The patient is not nervous/anxious and is not hyperactive.     unknown if currently breastfeeding.There is no height or weight on file to calculate BMI.  General Appearance: Well Groomed  Eye Contact:  Good  Speech:  Clear and Coherent and Normal Rate  Volume:  Normal  Mood:  Anxious and Depressed  Affect:  Appropriate  Thought Process:  Coherent and Descriptions of Associations: Intact  Orientation:  Full (Time, Place, and Person)  Thought Content:  Delusions and Hallucinations: Auditory Tactile  Suicidal Thoughts:  No  Homicidal Thoughts:  No  Memory:  Immediate;   Good Recent;   Good Remote;   Fair  Judgement:  Fair  Insight:  Fair  Psychomotor Activity:  Normal  Concentration:  Concentration: Fair and Attention Span: Fair  Recall:  [employer] [place]: Good  Akathisia:  No  Handed:  Right  AIMS (if indicated):  not done  Assets:  Communication Skills Desire for [place] Transportation  ADL's:  Intact  Cognition: WNL  Sleep:  Fair   Screenings: AUDIT    Flowsheet Row Admission (Discharged) from 10/14/2017 in [facility]  Alcohol Use Disorder Identification Test Final Score (AUDIT) 0      GAD-7    Flowsheet Row Office Visit from 08/27/2022 in [facility] Office Visit from 02/17/2021 in [facility] Office Visit from 11/25/2020 in [facility] Office Visit from 10/21/2020 in [facility] Video Visit from 07/22/2020 in [facility]  Total GAD-7 Score 5 0 1 0 2      PHQ2-9    [place] Office Visit from 08/27/2022 in [facility] Office Visit from 02/17/2021 in [facility] Office Visit from 11/25/2020 in [facility] Office Visit from 10/21/2020 in [facility] Video Visit from 07/22/2020 in [facility]  PHQ-2 Total Score 1 0 0 0 0      [place] Office Visit from 08/27/2022 in [facility] Admission (Discharged) from 11/17/2021 in [facility] Office Visit from 02/17/2021 in [place] No Risk No Risk No Risk       Assessment and Plan:   Milady Defazio is a 37 year old, African-American female with a past psychiatric history significant for schizophrenia (undifferentiated) who presents to [facility], accompanied by her husband Starlit Koenen, [phone]), to reestablish psychiatric care for medication management.  Patient was previously seen by this facility and was originally taking Abilify Maintena 400 mg long-acting injectable.  Prior to being placed on Abilify Maintena, patient was also taking Rolland Porter for the management of her schizophrenia before discontinuing due to elevated prolactin levels.  Patient presents to the encounter experiencing symptoms that she had experienced prior to being placed on medication.  Per patient's husband, patient will experience auditory hallucinations.  Patient also admitted to spelling things that are not there such as blood and feeling that her body is shrinking when taking a shower or shrinking due to lazer beams. Patient is interested in medication management; however, she expresses concerns about the medication being passed through breast milk.  Provider informed patient that there is a possibility of her medication being passed through breast milk.  Provider suggested that patient wean her child off of breast-feeding before starting medication.  Patient and her husband were agreeable to the  idea.  Patient and her husband agreed to try and wean their child off breast milk within a month and then start her long-acting injectable.  Collaboration of Care: Medication Management AEB Provider managing patient's psychiatric medication and Psychiatrist AEB patient being followed a mental health provider at this facility  Patient/Guardian was advised Release of Information must be obtained prior to any record release in order to collaborate their care with an outside provider. Patient/Guardian was advised if they have not already done so to contact the registration department to sign all necessary forms in order for [place] to release information regarding their care.   Consent: Patient/Guardian gives verbal consent for treatment and assignment of benefits for services provided during this visit. Patient/Guardian expressed understanding and agreed to proceed.   1. Schizophrenia, undifferentiated ([place]) Patient to be placed back on Abilify Maintena 400 mg muscular injection Provider discussed with patient and her husband about waiting a month before starting Abilify to provide enough time for patient to wean child of breast feeding  Patient to follow up with shot clinic in a month Provider spent a total of 60+ minutes with the patient/reviewing the patient's chart  Malachy Mood, PA 4/1/20248:17 AM

## 2022-08-30 ENCOUNTER — Encounter (HOSPITAL_COMMUNITY): Payer: Self-pay | Admitting: Physician Assistant

## 2022-11-03 ENCOUNTER — Telehealth (HOSPITAL_COMMUNITY): Payer: Self-pay | Admitting: Physician Assistant

## 2022-11-16 NOTE — Telephone Encounter (Signed)
Message acknowledged and reviewed.  Provider to conduct appointment with this patient tomorrow.  They are originally discussed with patient weaning current child from breast-feeding so she could start medications.  Provider to discuss medication management with the patient during their encounter tomorrow.

## 2022-11-17 ENCOUNTER — Ambulatory Visit (INDEPENDENT_AMBULATORY_CARE_PROVIDER_SITE_OTHER): Payer: BLUE CROSS/BLUE SHIELD | Admitting: Physician Assistant

## 2022-11-17 ENCOUNTER — Encounter (HOSPITAL_COMMUNITY): Payer: Self-pay | Admitting: Physician Assistant

## 2022-11-17 VITALS — BP 137/90 | HR 117 | Ht 64.0 in | Wt 131.0 lb

## 2022-11-17 DIAGNOSIS — F203 Undifferentiated schizophrenia: Secondary | ICD-10-CM | POA: Diagnosis not present

## 2022-11-17 NOTE — Progress Notes (Signed)
BH MD/PA/NP OP Progress Note  11/17/2022 8:26 PM Amy Mcclain  MRN:  [id number]  Chief Complaint:  Chief Complaint  Patient presents with   Follow-up   HPI: ***  Amy Mcclain  Visit Diagnosis:    ICD-10-CM   1. Schizophrenia, undifferentiated (HCC)  F20.3       Past Psychiatric History:  Patient has a past psychiatric history significant for schizoaffective disorder   Past Medical History:  Past Medical History:  Diagnosis Date   Anemia    Depression    Schizophrenia (HCC)     Past Surgical History:  Procedure Laterality Date   NO PAST SURGERIES      Family Psychiatric History:  Patient and her husband deny a family history of psychiatric illness.   Family history of suicide attempt: Patient denies Family history of homicide attempt: Patient denies Family history of substance abuse: Patient denies  Family History:  Family History  Problem Relation Age of Onset   Hypertension Mother    Diabetes Mother    Diabetes Father    Hypertension Father     Social History:  Social History   Socioeconomic History   Marital status: Married    Spouse name: Not on file   Number of children: Not on file   Years of education: Not on file   Highest education level: Not on file  Occupational History   Not on file  Tobacco Use   Smoking status: Never   Smokeless tobacco: Never  Vaping Use   Vaping Use: Never used  Substance and Sexual Activity   Alcohol use: No    Alcohol/week: 0.0 standard drinks of alcohol   Drug use: No   Sexual activity: Yes  Other Topics Concern   Not on file  Social History Narrative   Not on file   Social Determinants of Health   Financial Resource Strain: Not on file  Food Insecurity: Not on file  Transportation Needs: Not on file  Physical Activity: Not on file  Stress: Not on file  Social Connections: Not on file    Allergies:  Allergies  Allergen Reactions   Ceftin [Cefuroxime Axetil] Rash    bruising    Metabolic  Disorder Labs: Lab Results  Component Value Date   HGBA1C 4.8 10/18/2017   MPG 91.06 10/18/2017   Lab Results  Component Value Date   PROLACTIN 166.7 (H) 10/18/2017   Lab Results  Component Value Date   CHOL 168 10/18/2017   TRIG 60 10/18/2017   HDL 54 10/18/2017   CHOLHDL 3.1 10/18/2017   VLDL 12 10/18/2017   LDLCALC 102 (H) 10/18/2017   Lab Results  Component Value Date   TSH 1.713 10/18/2017    Therapeutic Level Labs: No results found for: "LITHIUM" No results found for: "VALPROATE" No results found for: "CBMZ"  Current Medications: Current Outpatient Medications  Medication Sig Dispense Refill   ferrous sulfate 325 (65 FE) MG tablet Take 325 mg by mouth daily with breakfast.     hydrOXYzine (ATARAX/VISTARIL) 50 MG tablet Take 1 tablet (50 mg total) by mouth every 6 (six) hours as needed for anxiety. (Patient not taking: Reported on 07/15/2021) 60 tablet 0   ibuprofen (ADVIL) 600 MG tablet Take 1 tablet (600 mg total) by mouth every 6 (six) hours as needed. 30 tablet 0   Prenatal Vit-Fe Fumarate-FA (PRENATAL MULTIVITAMIN) TABS tablet Take 1 tablet by mouth daily at 12 noon.     No current facility-administered medications for this visit.  Musculoskeletal: Strength & Muscle Tone: within normal limits Gait & Station: normal Patient leans: N/A  Psychiatric Specialty Exam: Review of Systems  Psychiatric/Behavioral:  Positive for behavioral problems and hallucinations. Negative for decreased concentration, dysphoric mood, self-injury, sleep disturbance and suicidal ideas. The patient is not nervous/anxious and is not hyperactive.     Blood pressure (!) 137/90, pulse (!) 117, height 5\' 4"  (1.626 m), weight 131 lb (59.4 kg), SpO2 98 %, unknown if currently breastfeeding.Body mass index is 22.49 kg/m.  General Appearance: Well Groomed  Eye Contact:  Good  Speech:  Clear and Coherent and Normal Rate  Volume:  Normal  Mood:  Euthymic  Affect:  Appropriate  Thought  Process:  Coherent and Descriptions of Associations: Intact  Orientation:  Full (Time, Place, and Person)  Thought Content: Hallucinations: Olfactory   Suicidal Thoughts:  No  Homicidal Thoughts:  No  Memory:  Immediate;   Good Recent;   Good Remote;   Fair  Judgement:  Fair  Insight:  Fair  Psychomotor Activity:  Normal  Concentration:  Concentration: Good and Attention Span: Good  Recall:  [employer] of Knowledge: Fair  Language: Good  Akathisia:  No  Handed:  Right  AIMS (if indicated): not done  Assets:  Communication Skills Desire for Improvement Housing Intimacy Social Support Transportation  ADL's:  Intact  Cognition: WNL  Sleep:  Fair   Screenings: AUDIT    Flowsheet Row [facility]  Alcohol Use Disorder Identification Test Final Score (AUDIT) 0      GAD-7    Flowsheet Row Clinical Support from 11/17/2022 in [facility] Office Visit from 08/27/2022 in [facility] Office Visit from 02/17/2021 in [facility] Office Visit from 11/25/2020 in [facility] Office Visit from 10/21/2020 in [facility]  Total GAD-7 Score 0 5 0 1 0      PHQ2-9    Flowsheet Row Clinical Support from 11/17/2022 in [facility] Office Visit from 08/27/2022 in [facility] Office Visit from 02/17/2021 in [facility] Office Visit from 11/25/2020 in [facility] Office Visit from 10/21/2020 in [facility]  PHQ-2 Total Score 0 1 0 0 0      Flowsheet Row Clinical Support from 11/17/2022 in [facility] Office Visit from 08/27/2022 in [facility] Admission (Discharged) from 11/17/2021 in [facility]  Mother Baby Unit  C-SSRS RISK CATEGORY No Risk No Risk No Risk        Assessment and Plan: ***    Collaboration of Care: Collaboration of Care: Medication Management AEB provider managing patient's psychiatric medication and Psychiatrist AEB patient being followed by mental health provider at this facility  Patient/Guardian was advised Release of Information must be obtained prior to any record release in order to collaborate their care with an outside provider. Patient/Guardian was advised if they have not already done so to contact the registration department to sign all necessary forms in order for [place] to release information regarding their care.   Consent: Patient/Guardian gives verbal consent for treatment and assignment of benefits for services provided during this visit. Patient/Guardian expressed understanding and agreed to proceed.   1. Schizophrenia, undifferentiated (HCC) Plan for patient to be placed back on Abilify Maintena 400 mg muscular injection  Patient  to follow up with shot clinic on 12/08/2022 Provider spent a total of 45 minutes with the patient/reviewing patient's chart  Meta Hatchet, PA 11/17/2022, 8:26 PM

## 2022-12-08 ENCOUNTER — Ambulatory Visit (HOSPITAL_COMMUNITY): Payer: BLUE CROSS/BLUE SHIELD | Admitting: *Deleted

## 2022-12-08 NOTE — Progress Notes (Signed)
PATIENT HAS STATED THAT SHE DOESN'T WANT TO TAKE THE INJECTION: Abilify Maintena 400 mg  BECAUSE SHE IS SCARED.  PULLED PROVIDER EDDIE NWOKO INTO THE ROOM TO WITNESS PATIENT REFUSAL. PROVIDER SUGGESTED PATIENT RETURN IN A MONTH TO HAVE APPOINTMENT TO SEE HOW SHE IS DOING. AND INFORMED THAT IF SHE CHANGES HER MIND TO LEY [place] KNOW.

## 2023-01-12 ENCOUNTER — Encounter (HOSPITAL_COMMUNITY): Payer: BLUE CROSS/BLUE SHIELD | Admitting: Physician Assistant

## 2023-01-25 ENCOUNTER — Encounter (HOSPITAL_COMMUNITY): Payer: BLUE CROSS/BLUE SHIELD | Admitting: Physician Assistant

## 2023-01-27 ENCOUNTER — Ambulatory Visit (HOSPITAL_COMMUNITY): Payer: BLUE CROSS/BLUE SHIELD | Admitting: Physician Assistant

## 2023-01-27 DIAGNOSIS — F203 Undifferentiated schizophrenia: Secondary | ICD-10-CM

## 2023-01-30 ENCOUNTER — Encounter (HOSPITAL_COMMUNITY): Payer: Self-pay | Admitting: Physician Assistant

## 2023-01-30 NOTE — Progress Notes (Signed)
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  Interpretive services were utilized during the duration of the encounter.  Patient is currently not on any medications at this time.  Patient's husband presents today encounter with several concerns.  He reports that the patient is refusing to take medication and he is unsure why.  Per patient, she does not feel she needs the medication.  She reports a past history of taking medication and feels that she does not need to take anything anymore.  She reports that she is not currently sick and she does not believe the diagnosis given to her.  She reports that the diagnosis given to her does not pertain to her and is not real.  Per patient's husband, patient's behaviors have been worrisome.  He is fearful that she may hurt the kids or him.  He reports that he has tried to get the patient admitted to the hospital via court order but was unable to due to the patient still being able to perform activities of daily living.  For example, patient's husband states that the patient will start hitting the table and stuff around the house for no reason at all.  He also reports that she smells things around the house such as blood.  Per patient, there is a "feeling" in the backyard that makes her feel "less."  She denies hearing or seeing things at this time but does express that she occasionally smells of blood.  She reports that the smell of blood comes and goes.  Although she denies auditory or visual hallucinations at this time, she expresses that on occasion she will see her hand  grow big and small.  She also states that her son will look milligram p.o. at times as well as small other times.  She reports that she feels that there is something making her "head feel ugly but it does not last for long."  Patient recalls a time when she was at the beach and felt the wind blowing on her.  When the wind was blowing on her, patient states that she felt something was stuck to her body and making her grow bigger.  Patient reports what she is experiencing is a normal feeling and she does not want to take any more medication.  Patient reports that she is given medication she will wait to use it until she feels that she needs to use it.  When patient was asked when she thinks the right time would be to take the medication, patient replied that she knows herself and knows when she would need to use the medication.  Patient reports that she would take the medication if her father allowed her to.  Patient's husband states that her father allowed for her to take medication in the past but she refused.  Patient is alert and oriented x 4, calm, cooperative, and fully engaged in conversation during the encounter.  Patient endorses normal mood.  Patient denies suicidal or homicidal ideations.  She further denies auditory or visual hallucinations and does not appear to be responding to internal/external stimuli.  She does report that on occasion, something from  far away is affecting her as if something is "biting" her from afar.  Patient endorses fair sleep and receives on average 5 hours of sleep per night.  Patient endorses good appetite and eats on average 2 meals per day along with a snack.  Patient denies alcohol consumption, tobacco use, or illicit drug use.  Visit Diagnosis:    ICD-10-CM   1. Schizophrenia, undifferentiated (HCC)  F20.3        Past Psychiatric History:  Patient has a past psychiatric history significant for schizoaffective disorder   Past Medical History:  Past Medical  History:  Diagnosis Date   Anemia    Depression    Schizophrenia (HCC)     Past Surgical History:  Procedure Laterality Date   NO PAST SURGERIES      Family Psychiatric History:  Patient and her husband deny a family history of psychiatric illness.   Family history of suicide attempt: Patient denies Family history of homicide attempt: Patient denies Family history of substance abuse: Patient denies  Family History:  Family History  Problem Relation Age of Onset   Hypertension Mother    Diabetes Mother    Diabetes Father    Hypertension Father     Social History:  Social History   Socioeconomic History   Marital status: Married    Spouse name: Not on file   Number of children: Not on file   Years of education: Not on file   Highest education level: Not on file  Occupational History   Not on file  Tobacco Use   Smoking status: Never   Smokeless tobacco: Never  Vaping Use   Vaping status: Never Used  Substance and Sexual Activity   Alcohol use: No    Alcohol/week: 0.0 standard drinks of alcohol   Drug use: No   Sexual activity: Yes  Other Topics Concern   Not on file  Social History Narrative   Not on file   Social Determinants of Health   Financial Resource Strain: Not on file  Food Insecurity: Not on file  Transportation Needs: Not on file  Physical Activity: Not on file  Stress: Not on file  Social Connections: Not on file    Allergies:  Allergies  Allergen Reactions   Ceftin [Cefuroxime Axetil] Rash    bruising    Metabolic Disorder Labs: Lab Results  Component Value Date   HGBA1C 4.8 10/18/2017   MPG 91.06 10/18/2017   Lab Results  Component Value Date   PROLACTIN 166.7 (H) 10/18/2017   Lab Results  Component Value Date   CHOL 168 10/18/2017   TRIG 60 10/18/2017   HDL 54 10/18/2017   CHOLHDL 3.1 10/18/2017   VLDL 12 10/18/2017   LDLCALC 102 (H) 10/18/2017   Lab Results  Component Value Date   TSH 1.713 10/18/2017     Therapeutic Level Labs: No results found for: "LITHIUM" No results found for: "VALPROATE" No results found for: "CBMZ"  Current Medications: Current Outpatient Medications  Medication Sig Dispense Refill   ferrous sulfate 325 (65 FE) MG tablet Take 325 mg by mouth daily with breakfast.     hydrOXYzine (ATARAX/VISTARIL) 50 MG tablet Take 1 tablet (50 mg total) by mouth every 6 (six) hours as needed for anxiety. (Patient not taking: Reported on 07/15/2021) 60 tablet 0   ibuprofen (ADVIL) 600 MG tablet Take 1 tablet (600 mg total) by mouth every 6 (six) hours as needed. 30 tablet 0   Prenatal Vit-Fe Fumarate-FA (PRENATAL MULTIVITAMIN) TABS  tablet Take 1 tablet by mouth daily at 12 noon.     No current facility-administered medications for this visit.     Musculoskeletal: Strength & Muscle Tone: within normal limits Gait & Station: normal Patient leans: N/A  Psychiatric Specialty Exam: Review of Systems  Psychiatric/Behavioral:  Positive for behavioral problems and hallucinations. Negative for decreased concentration, dysphoric mood, self-injury, sleep disturbance and suicidal ideas. The patient is not nervous/anxious and is not hyperactive.     unknown if currently breastfeeding.There is no height or weight on file to calculate BMI.  General Appearance: Well Groomed  Eye Contact:  Good  Speech:  Clear and Coherent and Normal Rate  Volume:  Normal  Mood:  Euthymic  Affect:  Appropriate  Thought Process:  Coherent and Descriptions of Associations: Intact  Orientation:  Full (Time, Place, and Person)  Thought Content: Hallucinations: Olfactory Visual   Suicidal Thoughts:  No  Homicidal Thoughts:  No  Memory:  Immediate;   Good Recent;   Good Remote;   Fair  Judgement:  Fair  Insight:  Fair  Psychomotor Activity:  Normal  Concentration:  Concentration: Good and Attention Span: Good  Recall:  [employer] of Knowledge: Fair  Language: Good  Akathisia:  No  Handed:  Right   AIMS (if indicated): not done  Assets:  Communication Skills Desire for Improvement Housing Intimacy Social Support Transportation  ADL's:  Intact  Cognition: WNL  Sleep:  Fair   Screenings: AUDIT    Flowsheet Row [facility]  Alcohol Use Disorder Identification Test Final Score (AUDIT) 0      GAD-7    Flowsheet Row Clinical Support from 01/27/2023 in [facility] Clinical Support from 11/17/2022 in [facility] Office Visit from 08/27/2022 in [facility] Office Visit from 02/17/2021 in [facility] Office Visit from 11/25/2020 in [facility]  Total GAD-7 Score 0 0 5 0 1      PHQ2-9    Flowsheet Row Clinical Support from 01/27/2023 in [facility] Clinical Support from 11/17/2022 in [facility] Office Visit from 08/27/2022 in [facility] Office Visit from 02/17/2021 in [facility] Office Visit from 11/25/2020 in [facility]  PHQ-2 Total Score 0 0 1 0 0      Flowsheet Row Clinical Support from 01/27/2023 in [facility] Clinical Support from 11/17/2022 in [facility] Office Visit from 08/27/2022 in [facility]  C-SSRS RISK CATEGORY No Risk No Risk No Risk        Assessment and Plan:   Zahria Krumwiede is a 37 year old female with a 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  Patient is not currently on any medications at this time and refused medication  management during her assessment at the shot clinic.  Patient's husband presents to the encounter expressing concerns over the patient's behaviors and refusal to take medication.  During the encounter, patient seemingly expressed experiencing olfactory and visual hallucinations; however, she refuses to be placed on any medications at this time.  She expressed being tired of taking medications and states that her past history of medication use did not make her  feel better.  She reports that if she were given medication, then she would determine the best time for the medication to be used.  She also reported that if her father gave her permission to take medication, then she would start taking medication.  Patient has been expressed that her father allowed her to take medications in the past, but she still refused.  Provider informed patient's husband that as long as patient refused medication, then medications could not be forced on to her.  Patient agreed to follow up in 6 weeks to assess her mood and behavior.  Collaboration of Care: Collaboration of Care: Medication Management AEB provider managing patient's psychiatric medication and Psychiatrist AEB patient being followed by mental health provider at this facility  Patient/Guardian was advised Release of Information must be obtained prior to any record release in order to collaborate their care with an outside provider. Patient/Guardian was advised if they have not already done so to contact the registration department to sign all necessary forms in order for [place] to release information regarding their care.   Consent: Patient/Guardian gives verbal consent for treatment and assignment of benefits for services provided during this visit. Patient/Guardian expressed understanding and agreed to proceed.   1. Schizophrenia, undifferentiated (HCC) Patient refuses medication management at this time  Patient to follow up in 6 weeks Provider spent a total of 60  + minutes with the patient/reviewing patient's chart  Meta Hatchet, PA 01/30/2023, 2:29 PM

## 2023-03-15 ENCOUNTER — Encounter (HOSPITAL_COMMUNITY): Payer: BLUE CROSS/BLUE SHIELD | Admitting: Physician Assistant

## 2023-11-29 ENCOUNTER — Encounter (HOSPITAL_COMMUNITY): Payer: Self-pay | Admitting: Physician Assistant

## 2023-11-29 ENCOUNTER — Emergency Department (HOSPITAL_COMMUNITY)
Admission: EM | Admit: 2023-11-29 | Discharge: 2023-12-03 | Disposition: A | Discharge status: Other Acute Inpatient Hospital | Payer: MEDICAID | Attending: *Deleted | Admitting: *Deleted

## 2023-11-29 DIAGNOSIS — Z3201 Encounter for pregnancy test, result positive: Secondary | ICD-10-CM | POA: Insufficient documentation

## 2023-11-29 DIAGNOSIS — O99341 Other mental disorders complicating pregnancy, first trimester: Secondary | ICD-10-CM | POA: Diagnosis present

## 2023-11-29 DIAGNOSIS — F29 Unspecified psychosis not due to a substance or known physiological condition: Secondary | ICD-10-CM | POA: Diagnosis not present

## 2023-11-29 DIAGNOSIS — F209 Schizophrenia, unspecified: Secondary | ICD-10-CM | POA: Diagnosis present

## 2023-11-29 LAB — COMPREHENSIVE METABOLIC PANEL WITH GFR
ALT: 12 U/L (ref 0–44)
AST: 20 U/L (ref 15–41)
Albumin: 3.5 g/dL (ref 3.5–5.0)
Alkaline Phosphatase: 60 U/L (ref 38–126)
Anion gap: 10 (ref 5–15)
BUN: 6 mg/dL (ref 6–20)
CO2: 20 mmol/L — ABNORMAL LOW (ref 22–32)
Calcium: 8.9 mg/dL (ref 8.9–10.3)
Chloride: 106 mmol/L (ref 98–111)
Creatinine, Ser: 0.43 mg/dL — ABNORMAL LOW (ref 0.44–1.00)
GFR, Estimated: >60 mL/min (ref >60)
Glucose, Bld: 108 mg/dL — ABNORMAL HIGH (ref 70–99)
Potassium: 3.8 mmol/L (ref 3.5–5.1)
Sodium: 136 mmol/L (ref 135–145)
Total Bilirubin: 0.5 mg/dL (ref 0.0–1.2)
Total Protein: 7.6 g/dL (ref 6.5–8.1)

## 2023-11-29 LAB — CBC WITH DIFFERENTIAL/PLATELET
Abs Immature Granulocytes: 0.08 10*3/uL — ABNORMAL HIGH (ref 0.00–0.07)
Basophils Absolute: 0.1 10*3/uL (ref 0.0–0.1)
Basophils Relative: 0 %
Eosinophils Absolute: 0 10*3/uL (ref 0.0–0.5)
Eosinophils Relative: 0 %
HCT: 32.5 % — ABNORMAL LOW (ref 36.0–46.0)
Hemoglobin: 10.5 g/dL — ABNORMAL LOW (ref 12.0–15.0)
Immature Granulocytes: 1 %
Lymphocytes Relative: 11 %
Lymphs Abs: 1.6 10*3/uL (ref 0.7–4.0)
MCH: 28.9 pg (ref 26.0–34.0)
MCHC: 32.3 g/dL (ref 30.0–36.0)
MCV: 89.5 fL (ref 80.0–100.0)
Monocytes Absolute: 0.7 10*3/uL (ref 0.1–1.0)
Monocytes Relative: 5 %
Neutro Abs: 12.3 10*3/uL — ABNORMAL HIGH (ref 1.7–7.7)
Neutrophils Relative %: 83 %
Platelets: 370 10*3/uL (ref 150–400)
RBC: 3.63 MIL/uL — ABNORMAL LOW (ref 3.87–5.11)
RDW: 13.6 % (ref 11.5–15.5)
WBC: 14.7 10*3/uL — ABNORMAL HIGH (ref 4.0–10.5)
nRBC: 0 % (ref 0.0–0.2)

## 2023-11-29 LAB — SALICYLATE LEVEL: Salicylate Lvl: <7 mg/dL — ABNORMAL LOW (ref 7.0–30.0)

## 2023-11-29 LAB — ETHANOL: Alcohol, Ethyl (B): <15 mg/dL (ref <15)

## 2023-11-29 LAB — HCG, SERUM, QUALITATIVE: Preg, Serum: POSITIVE — AB

## 2023-11-29 LAB — ACETAMINOPHEN LEVEL: Acetaminophen (Tylenol), Serum: <10 ug/mL — ABNORMAL LOW (ref 10–30)

## 2023-11-29 MED ORDER — STERILE WATER FOR INJECTION IJ SOLN
INTRAMUSCULAR | Status: AC
  Administered 2023-11-29: 10 mL
  Filled 2023-11-29: qty 10

## 2023-11-29 MED ORDER — ZIPRASIDONE MESYLATE 20 MG IM SOLR
10.0000 mg | Freq: Once | INTRAMUSCULAR | Status: AC
  Administered 2023-11-29: 10 mg via INTRAMUSCULAR
  Filled 2023-11-29: qty 20

## 2023-11-29 NOTE — ED Triage Notes (Signed)
 Pt. BIBGPD w/ c/o manic episode and hallucinations. HX schizophrenia. Pt. IVC'd.

## 2023-11-29 NOTE — ED Notes (Signed)
 This RN was able to obtain labs w/ assistance of interpreter Inocente [phone]. Pt also dressed into burgundy scrubs, all belongings placed in belongings bag, and wanded per protocol. Pt allowed to keep her head wrap per religious practice.

## 2023-11-29 NOTE — ED Notes (Signed)
 Patient states she was not going to give a UA
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
## 2023-11-29 NOTE — ED Notes (Signed)
 Pt refused vitals, blood work and urine screening.

## 2023-11-29 NOTE — ED Notes (Signed)
 This RN was able to obtain VS w/ assistance of Arabic interpreter but Pt is still refusing labs.

## 2023-11-29 NOTE — ED Notes (Signed)
 Pt. Non-cooperative w/ staff. Pt. Refusing to change into scrubs and stating to interpreter that she is not staying here. Pt. Is IVC'd. MD aware.

## 2023-11-29 NOTE — ED Notes (Signed)
Patient is resting

## 2023-11-30 DIAGNOSIS — F209 Schizophrenia, unspecified: Secondary | ICD-10-CM

## 2023-11-30 LAB — HCG, QUANTITATIVE, PREGNANCY: hCG, Beta Chain, Quant, S: 84047 m[IU]/mL — ABNORMAL HIGH (ref <5)

## 2023-11-30 MED ORDER — PRENATAL MULTIVITAMIN CH
1.0000 | ORAL_TABLET | Freq: Every day | ORAL | Status: DC
  Filled 2023-11-30 (×4): qty 1

## 2023-11-30 MED ORDER — DIPHENHYDRAMINE HCL 25 MG PO CAPS: 50.0000 mg | ORAL_CAPSULE | Freq: Three times a day (TID) | ORAL | Status: DC | PRN

## 2023-11-30 MED ORDER — OLANZAPINE 10 MG IM SOLR: 5.0000 mg | Freq: Three times a day (TID) | INTRAMUSCULAR | Status: DC | PRN

## 2023-11-30 MED ORDER — DIPHENHYDRAMINE HCL 50 MG/ML IJ SOLN: 50.0000 mg | Freq: Three times a day (TID) | INTRAMUSCULAR | Status: DC | PRN

## 2023-11-30 MED ORDER — OLANZAPINE 5 MG PO TBDP: 5.0000 mg | ORAL_TABLET | Freq: Three times a day (TID) | ORAL | Status: DC | PRN

## 2023-11-30 MED ORDER — QUETIAPINE FUMARATE 50 MG PO TABS
50.0000 mg | ORAL_TABLET | Freq: Two times a day (BID) | ORAL | Status: DC
  Filled 2023-11-30 (×4): qty 1
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
## 2023-11-30 NOTE — ED Notes (Signed)
 Pt still on the acute side
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
## 2023-11-30 NOTE — ED Notes (Signed)
 Patient refused her Seroquel-stated, no need medicine

## 2023-11-30 NOTE — Progress Notes (Signed)
 LCSW Progress Note  [id number]   Amrit Erck  11/30/2023  10:01 AM  Description:   Inpatient Psychiatric Referral  Patient was recommended inpatient per Richerd Ivans There are no available beds at [facility], per [facility] Burnard Barter RN. Patient was referred to the following out of network facilities:   Destination   Service Provider Address Phone Fax  [facility]  [address] [place] [address] [phone]  [facility] EFAX  [address], [place] [phone]  [facility]  [address] [place], [place] [address] [phone]  [facility]  [address] [place] [address] [phone]  [facility]  [address] [place] [address] [phone]  [facility]  [address] [place] [address] [phone]      Situation ongoing, CSW to continue following and update chart as more information becomes available.     [place] Breya Cass MSW, LCSW  11/30/2023 10:01 AM

## 2023-11-30 NOTE — Progress Notes (Signed)
 Inpatient Psychiatric Referral  Patient was recommended inpatient per Richerd Ivans, NP . There are no available beds at [facility], per [facility] Luke Sprang, RN. Patient was referred to the following out of network facilities:  Destination  Service Provider Request Status Address Phone Fax  [facility]  Pending - Request Sent [address] [place] [address] [phone]  [facility]  Pending - Request Sent [address] [place] [phone]  [facility]  Pending - Request Sent [address] [place] [address] [phone]  [facility]  Pending - Request [facility] [address], [place] [address] [phone]  [facility]  Pending - Request Sent [address], [place] [address] [phone]  [facility]  Pending - Request Sent [address] [place] [address] [phone]  [facility]  Pending - Request Sent [address] [place] [address] [phone]  [facility]  Pending - Request Sent [address], [place] [address] [phone]    Situation ongoing, CSW to continue following and update chart as more information becomes available.   Harrie Sofia MSW, LCSWA 11/30/2023  9:02PM

## 2023-11-30 NOTE — Consult Note (Signed)
 [facility] Psychiatric Consult Follow-up  Patient Name: .Amy Mcclain  MRN: [id number]  DOB: 1986/03/23  Consult Order details:  Orders (From admission, onward)     Start     Ordered   11/29/23 2237  CONSULT TO CALL ACT TEAM       Ordering Provider: Edie Rosebud LABOR, PA-C  Provider:  (Not yet assigned)  Question:  Reason for Consult?  Answer:  Psych consult   11/29/23 2237             Mode of Visit: In person    Psychiatry Consult Evaluation  Service Date: November 30, 2023 LOS:  LOS: 0 days  Chief Complaint This room is very far away  Primary Psychiatric Diagnoses  Schizophrenia   Assessment  Amy Mcclain is a 38 y.o. female admitted: Presented to the EDfor 11/29/2023  4:44 PM for brought in by [employer] under IVC for symptoms of mania and hallucinations. 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   Her current presentation of acute psychosis is most consistent with decompensated schizophrenia. She meets criteria for inpatient psychiatric hospitalization based on being acutely psychotic.  Current outpatient psychotropic medications include none and historically she has had a N/A response to these medications. She was not compliant with medications prior to admission as evidenced by reports patient does not believe she needs medication. On initial examination, patient is cooperative. Please see plan below for detailed recommendations.   Diagnoses:  Active Hospital problems: Principal Problem:   Schizophrenia, unspecified (HCC)    Plan   ## Psychiatric Medication Recommendations:  Start  --seroquel 50mg  PO BID --olazapine 5mg  PO or IM Q 8 hours PRN agitation --benadryl  50mg  PO or IM Q 8 hours PRN for agitation if olanzapine  is not affective   ## Medical Decision Making Capacity: Not specifically addressed in this encounter  ## Further Work-up:  -- most recent EKG on 10/17/2017 had QtC  of 449; updated EKG is pending -- Pertinent labwork reviewed earlier this admission includes: CBC, CMP, acetaminophen , salicylate, pregnancy and alcohol; UDS is pending   ## Disposition:-- We recommend inpatient psychiatric hospitalization after medical hospitalization. Patient has been involuntarily committed on 11/29/2023.   ## Behavioral / Environmental: -Utilize compassion and acknowledge the patient's experiences while setting clear and realistic expectations for care.    ## Safety and Observation Level:  - Based on my clinical evaluation, I estimate the patient to be at low  risk of self harm in the current setting. - At this time, we recommend  routine. This decision is based on my review of the chart including patient's history and current presentation, interview of the patient, mental status examination, and consideration of suicide risk including evaluating suicidal ideation, plan, intent, suicidal or self-harm behaviors, risk factors, and protective factors. This judgment is based on our ability to directly address suicide risk, implement suicide prevention strategies, and develop a safety plan while the patient is in the clinical setting. Please contact our team if there is a concern that risk level has changed.  CSSR Risk Category:C-SSRS RISK CATEGORY: No Risk  Suicide Risk Assessment: Patient has following modifiable risk factors for suicide: medication noncompliance and active mental illness (to encompass adhd, tbi, mania, psychosis, trauma reaction), which we are addressing by recommending inpatient psychiatric hospitalization. Patient has following non-modifiable or demographic risk factors for suicide: psychiatric hospitalization Patient has the following protective factors against suicide: Access to outpatient mental health care,  Supportive family, and Minor children in the home  Thank you for this consult request. Recommendations have been communicated to the primary team.  We  will continue to follow at this time.   Amy Mcclain Patient, NP       History of Present Illness  Relevant Aspects of Hospital ED Course:  Admitted on 11/29/2023 for brought in by [employer] under IVC for symptoms of mania and hallucinations. 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    Patient Report:  Amy Mcclain, is seen face to face by this provider, consulted with Dr. Larina; and chart reviewed on 11/30/23.  Patient is assessed using interpreter services [phone].  On evaluation Amy Mcclain reports the neighbors are trying to harm there and her children with smells of rotting meat, pus and blood.  She also expressed concern that the neighbors are controlling her remotely through radiation and lasers from the TV.  She shared that they also spray something from their mouths to hurt her.  The patient says the lasers make my abdomen and private area tight.    Patient is pregnant.  She is unable to say how far along she is.  She said she has suspected she was pregnant but didn't know for sure.  She voices concern that she doesn't want to tell about the pregnancy because she is afraid they will try to take my baby.  During evaluation Amy Mcclain is seated on her bed in no acute distress.  She is alert & oriented x 4, calm, cooperative and attentive for this assessment.  Her mood is anxious with congruent affect.  She has normal speech, and behavior.  Objectively there is evidence of psychosis/mania and delusional thinking. Pt does not appear to be responding to internal or external stimuli.  Patient is able to converse coherently, goal directed thoughts, no distractibility, or pre-occupation.  She denies suicidal/self-harm/homicidal ideation, psychosis, and paranoia.  Patient answered question appropriately.    I personally spent a total of 70 minutes in the care of the patient today including preparing to see the patient,  getting/reviewing separately obtained history, performing a medically appropriate exam/evaluation, counseling and educating, placing orders, referring and communicating with other health care professionals, documenting clinical information in the EHR, independently interpreting results, communicating results, and coordinating care.  Psych ROS:  Depression: denies Anxiety:  denies Mania (lifetime and current): denies, but appears manic Psychosis: (lifetime and current): denies, but is actively psychotic   Collateral information:  Contacted Amy Mcclain at [phone] on 11/30/2023  Mr Lore stated that patient was receiving injections for her schizophrenia until a year ago when she quit taking the medication. Mr Cabell was unaware his wife is pregnant.  He is very concerned about her health and mental wellbeing.  He asks that we call his daughter, Amy Mcclain, [phone] with information when patient is transferred because she is more comfortable speaking English.    Review of Systems  Psychiatric/Behavioral:  Positive for hallucinations.   All other systems reviewed and are negative.    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 Legal Hx: none Living Situation: lives with spouse and 4 children Spiritual Hx: muslim  Access to weapons/lethal means: denies   Substance History Alcohol: denies  Tobacco: denies Illicit drugs: denies Prescription drug abuse: denies Rehab hx: denies  Exam Findings  Physical Exam:  Vital Signs:  Temp:  [97.7 F  (36.5 C)-99.2 F (37.3 C)] 97.7 F (36.5 C) (07/02 0602) Pulse Rate:  [92-142] 102 (07/02 0602) Resp:  [17-18] 17 (07/02 0602) BP: (139-140)/(64-65) 139/64 (07/02 0602) SpO2:  [100 %] 100 % (07/02 0602) Weight:  [59 kg] 59 kg (07/01 1919) Blood pressure 139/64, pulse (!) 102, temperature 97.7 F (36.5 C), temperature source Oral, resp. rate 17, height 5' (1.524 m), weight 59 kg, last menstrual period 08/04/2023, SpO2 100%, unknown if currently breastfeeding. Body mass index is 25.39 kg/m.  Physical Exam Vitals and nursing note reviewed.  Eyes:     Pupils: Pupils are equal, round, and reactive to light.  Pulmonary:     Effort: Pulmonary effort is normal.  Skin:    General: Skin is dry.  Neurological:     Mental Status: She is alert and oriented to person, place, and time.  Psychiatric:        Attention and Perception: Attention normal. She perceives auditory and visual hallucinations.        Mood and Affect: Mood is anxious.        Speech: Speech is rapid and pressured.        Behavior: Behavior is agitated and actively hallucinating. Behavior is cooperative.        Thought Content: Thought content is paranoid and delusional.        Cognition and Memory: Cognition and memory normal.     Mental Status Exam: General Appearance: Casual  Orientation:  Full (Time, Place, and Person)  Memory:  Immediate;   Good Recent;   Good Remote;   Good  Concentration:  Concentration: Good  Recall:  Good  Attention  Fair  Eye Contact:  Good  Speech:  Pressured  Language:  Good  Volume:  Normal  Mood: Anxious  Affect:  Congruent  Thought Process:  Disorganized  Thought Content:  Delusions and Hallucinations: Auditory Visual  Suicidal Thoughts:  No  Homicidal Thoughts:  No  Judgement:  Impaired  Insight:  Shallow  Psychomotor Activity:  Restlessness  Akathisia:  No  Fund of Knowledge:  Good      Assets:  Communication Skills Housing Leisure Time Physical Health Social  Support  Cognition:  WNL  ADL's:  Intact  AIMS (if indicated):        Other History   These have been pulled in through the EMR, reviewed, and updated if appropriate.  Family History:  The patient's family history includes Diabetes in her father and mother; Hypertension in her father and mother.  Medical History: Past Medical History:  Diagnosis Date  . Anemia   . Depression   . Schizophrenia [facility])     Surgical History: Past Surgical History:  Procedure Laterality Date  . NO PAST SURGERIES       Medications:   Current Facility-Administered Medications:  .  QUEtiapine (SEROQUEL) tablet 50 mg, 50 mg, Oral, BID, Michon Kaczmarek A, NP  Current Outpatient Medications:  .  ferrous sulfate 325 (65 FE) MG tablet, Take 325 mg by mouth daily with breakfast., Disp: , Rfl:  .  hydrOXYzine  (ATARAX /VISTARIL ) 50 MG tablet, Take 1 tablet (50 mg total) by mouth every 6 (  six) hours as needed for anxiety. (Patient not taking: Reported on 07/15/2021), Disp: 60 tablet, Rfl: 0 .  ibuprofen  (ADVIL ) 600 MG tablet, Take 1 tablet (600 mg total) by mouth every 6 (six) hours as needed., Disp: 30 tablet, Rfl: 0 .  Prenatal Vit-Fe Fumarate-FA (PRENATAL MULTIVITAMIN) TABS tablet, Take 1 tablet by mouth daily at 12 noon., Disp: , Rfl:   Allergies: Allergies  Allergen Reactions  . Ceftin [Cefuroxime Axetil] Rash    bruising    Amy Mcclain Patient, NP

## 2023-11-30 NOTE — BH Assessment (Signed)
 Per [name] Pouch, NT, pt is sleeping. Clinician asked RN and NT to let her know when the pt is awake and able to engage in TTS assessment.    Amy JONETTA Broach, MS, [facility], [facility] Triage Specialist [phone]

## 2023-11-30 NOTE — ED Notes (Signed)
 Curtistine with [facility] facility called to confirm how far along patient was in pregnancy as they received patient's chart with lab results. This tech informed Curtistine that labs were done 11/29/23 at 21:30 and patient has not yet received prenatal care.

## 2023-11-30 NOTE — ED Provider Notes (Signed)
 Emergency Medicine Observation Re-evaluation Note  Averly Etheridge is a 38 y.o. female, seen on rounds today.  Pt initially presented to the ED for complaints of Psychiatric Evaluation Currently, the patient is asleep.  Physical Exam  BP 139/64   Pulse (!) 102   Temp 97.7 F (36.5 C) (Oral)   Resp 17   Ht 5' (1.524 m)   Wt 59 kg   LMP 08/04/2023   SpO2 100%   BMI 25.39 kg/m  Physical Exam General: asleep Cardiac: asleep Lungs: asleep Psych: asleep  ED Course / MDM  EKG:   I have reviewed the labs performed to date as well as medications administered while in observation.  Recent changes in the last 24 hours include geodon being given yesterday. Pregnancy test is positive.  Plan  Current plan is for inpatient psychiatric admission.    Freddi Hamilton, MD 11/30/23 [phone]

## 2023-11-30 NOTE — ED Notes (Signed)
 Pt on acute side doing TTS consult.

## 2023-11-30 NOTE — BH Assessment (Signed)
 Clinician messaged Newell Kurtz, EMT and [name] Pouch, NT: Hey. It's Trey with TTS. Is the pt able to engage in the assessment Also is the pt medically cleared?   Clinician awaiting response.    Jackson JONETTA Broach, MS, [facility], [facility] Triage Specialist [phone]

## 2023-12-01 ENCOUNTER — Emergency Department (HOSPITAL_COMMUNITY): Payer: MEDICAID

## 2023-12-01 NOTE — ED Provider Notes (Signed)
 Emergency Medicine Observation Re-evaluation Note  Amy Mcclain is a 38 y.o. female, seen on rounds today.  Pt initially presented to the ED for complaints of Psychiatric Evaluation Currently, the patient is awaiting inpatient psychiatry.  Physical Exam  BP 126/66   Pulse (!) 106   Temp 98.2 F (36.8 C) (Oral)   Resp 16   Ht 5' (1.524 m)   Wt 59 kg   LMP 08/04/2023   SpO2 100%   BMI 25.39 kg/m  Physical Exam General: Calm Cardiac: Well perfused Lungs: Even respirations Psych: Calm  ED Course / MDM  EKG:   I have reviewed the labs performed to date as well as medications administered while in observation.  Recent changes in the last 24 hours include inpatient psych recommendation; attempting to find bed for transfer. Patient is pregnant.   Plan  Current plan is for inpatient psych.    Darra Fonda MATSU, MD 12/01/23 [phone]

## 2023-12-01 NOTE — Progress Notes (Signed)
 LCSW Progress Note  [id number]   Debrah Granderson  12/01/2023  12:08 PM  Description:   Inpatient Psychiatric Referral  Patient was recommended inpatient per: Efrain Patient NP. There are no available beds at [facility], per [facility] Bretta Qua RN.Patient was referred to the following out of network facilities:   Destination  Service Provider Address Phone Fax  [facility]  [address] [place] [address] [phone]  [facility] EFAX  [address] [place] [phone]  [facility]  [address] [place], [place] [address] [phone]  [facility]  [address], [place] [address] [phone]  [facility]  [address], [place] [address] [phone]  [facility]  [address] [place] [address] [phone]  [facility]  [address] [place], [place] [address] [phone]  [facility]  [address], [place] [address] [phone]      Situation ongoing, CSW to continue following and update chart as more information becomes available.      [place] Shawnia Vizcarrondo , MSW, LCSW  12/01/2023 12:08 PM

## 2023-12-01 NOTE — Progress Notes (Addendum)
 Inpatient Psychiatric Referral  Patient was recommended inpatient per Kyra Weber, NP. There are no available beds at [facility], per [facility] Linsey Strader. RN. Patient was re-faxed to the following out of network facilities:  Destination  Service Provider Request Status Address Phone Fax  [facility]  Pending - Request Sent [address] [place] [address] [phone]  [facility]  Pending - Request Sent [address] [place] [phone]  [facility]  Pending - Request Sent [address] [place] [address] [phone]  [facility]  Pending - Request [facility] [address] [place] [address] [phone]  [facility]  Pending - Request Sent [address] [place] [address] [phone]  [facility]  Pending - Request Sent [address] [place] [address] [phone]  [facility]  Pending - Request Sent [address] [place] [address] [phone]  [facility]  Pending - Request Sent [address], [place] [address] [phone]  [facility]  Pending - Request Sent [address] [place] [address] [phone]  [facility]  Pending - Request Sent [address] [place], [place] [address] [phone]  [facility]  Pending - Request Sent [address] [place] [address] [phone]  [facility]  Pending - Request Sent [address], [place] [address] [phone]  [facility]  Pending - Request Sent [address],  [place] [address] [phone]  [facility]  Pending - Request Sent [address] [place] [address] [phone]  [facility]  Pending - Request Sent [address], [place] [address] [phone]  [facility]  Pending - Request Sent [address], [place] [address] [phone]  [facility]  Pending - Request [facility], [place] [phone]  [facility]  Pending - Request Sent [address] [place] [address] [phone]  [facility]  Pending - Request Sent [address] [place] [address] [phone]  [facility]  Pending - Request Sent [place] [phone]  [facility] EFAX  Pending - Request Sent [place] [phone]  [facility]  Pending - Request Sent [address] [place], [place] [address] [phone]    Situation ongoing, CSW to continue following and update chart as more information becomes available.   Harrie Sofia MSW, LCSWA 12/01/2023  6:55PM

## 2023-12-01 NOTE — ED Notes (Signed)
Pt refused to give urine specimen.  

## 2023-12-01 NOTE — ED Notes (Signed)
 Pt's daughter would like to be updated on pt's condition as well as her father. Daughter Ray-Ann [phone]

## 2023-12-01 NOTE — ED Notes (Signed)
 Patient refused all night medications and paced all night.

## 2023-12-01 NOTE — ED Notes (Signed)
 [facility] received a call from Roz Joseph, CPS investigator [phone]) to ask additional questions about pt. [facility] informed Ms. Bookman that it was recently discovered that pt is pregnant and her medication regimen has been modified due to her pregnancy. Ms. Joseph will be visiting pt her in the [facility] ED. Ms. Joseph asked to be notified of pts disposition.   Per provider Efrain, pts daughter (RayAnn, [phone]) would also like to be notified of pts disposition.  Chesley Holt, [facility]  12/01/23

## 2023-12-02 NOTE — Progress Notes (Signed)
 Inpatient Psychiatric Referral  Patient was recommended inpatient per Efrain Patient NP. There are no available beds at [facility], per [facility] Bretta Qua, RN. Patient was referred to the following out of network facilities:  Destination  Service Provider Request Status Address Phone Fax  [facility]  Pending - Request Sent [address] [place] [address] [phone]  [facility]  Pending - Request Sent [address] [place] [phone]  [facility]  Pending - Request Sent [address] [place] [address] [phone]  [facility]  Pending - Request [facility] [address], [place] [address] [phone]  [facility]  Pending - Request Sent [address], [place] [address] [phone]  [facility]  Pending - Request Sent [address] [place] [address] [phone]  [facility]  Pending - Request Sent [address] [place] [address] [phone]  [facility]  Pending - Request Sent [address], [place] [address] [phone]  [facility]  Pending - Request Sent [address] [place] [address] [phone]  [facility]  Pending - Request Sent [address] [place], [place] [address] [phone]  [facility] Per[facility] Eating Disorders  Pending - Request Sent [address] [place] [address] [phone]  [facility]  Pending - Request Sent [address] [place] [address] [phone]  [facility]  Pending - Request Sent [address],  [place] [address] [phone]  [facility]  Pending - Request Sent [address] [place] [address] [phone]  [facility]  Pending - Request Sent [address], [place] [address] [phone]  [facility]  Pending - Request Sent [address], [place] [address] [phone]  [facility] Patient Pl[facility]  Pending - Request [facility] [place], [place] [phone]  [facility]  Pending - Request Sent [address], [place] [address] [phone]  [facility]  Pending - Request Sent [address], [place] [address] [phone]  [facility]  Pending - Request Sent [facility] [phone]  [facility] Inpatient EFAX  Pending - Request Sent [place] [phone]  [facility]  Pending - Request Sent [address] [place], [place] [address] [phone]  [facility]  Pending - Request Sent [address] [place] [address] [phone]  [facility]  Pending - Request Sent [address] [place] [address] [phone]  [facility]  Pending - Request Sent [address] [place], [place] [address] [phone]  [facility]  Pending - Request Sent [address] [place] [address] [phone]  [facility]  Pending - Request Sent [address] [place] [address] [phone]    Situation ongoing, CSW to continue following and update chart as more information becomes available.   Harrie Sofia MSW, LCSWA 12/02/2023  7:25PM

## 2023-12-02 NOTE — ED Notes (Addendum)
 [facility] called to inform that they will accept this pt. RN called sheriffs office to set up transportation for tomorrow, left a voicemail.

## 2023-12-02 NOTE — ED Provider Notes (Signed)
 Emergency Medicine Observation Re-evaluation Note  Amy Mcclain is a 38 y.o. female, seen on rounds today.  Pt initially presented to the ED for complaints of Psychiatric Evaluation Currently, the patient is resting in the room.  Physical Exam  BP 128/67 (BP Location: Left Arm)   Pulse (!) 103   Temp 98.1 F (36.7 C) (Oral)   Resp 18   Ht 5' (1.524 m)   Wt 59 kg   LMP 08/04/2023   SpO2 100%   BMI 25.39 kg/m  Physical Exam General: resting comfortably, NAD Lungs: normal WOB Psych: currently calm and resting  ED Course / MDM  EKG:   I have reviewed the labs performed to date as well as medications administered while in observation.  Recent changes in the last 24 hours include none.  Plan  Current plan is for inpatient psych.    Bari Roxie HERO, [place] 12/02/23 9191

## 2023-12-02 NOTE — Progress Notes (Addendum)
 Pt has been accepted to [facility] on 12/02/2023 Bed assignment: Main campus  Pt meets inpatient criteria per Efrain Patient NP.   Attending Physician will be Millie Manners, MD  Report can be called to: [phone] (this is a pager, please leave call-back number when giving report)  Pt can arrive after 8 AM  Care Team Notified:  Delrae Mura, RN

## 2023-12-02 NOTE — Progress Notes (Signed)
 LCSW Progress Note  [id number]   Amy Mcclain  12/02/2023  10:34 AM  Description:   Inpatient Psychiatric Referral  Patient was recommended inpatient per Efrain Patient NP. There are [facility] at [facility], per [facility] Bretta Qua RN. Patient was referred to the following out of network facilities:   Destination  Service Provider Address Phone Fax  [facility]  [address] [place] [address] [phone]  [facility] EFAX  [address] [place] [phone]  [facility]  [address] [place], [place] [address] [phone]  [facility]  [address], [place] [address] [phone]  [facility]  [address] [place] [address] [phone]  [facility]  [address] [place] [place] [address] [phone]  [facility]-Adult  [address] [place], [place] [address] [phone]  [facility]  [address], [place] [address] [phone]  [facility]  [address] [place] [address] [phone]  [facility]  [address] [place], [place] [address] [phone]  [facility] Perinatal and Eating Disorders  [address] [place] [address] [phone]  [facility]  [address], [place] [address] [phone]  [facility]  [address], [place] [address] [phone]  [facility]  [address] [place] [address] [phone]  [facility]  [address] [place] [address] [phone]  [facility]  [address], [place] [address] [phone]  [facility] Patient Placement  [facility], [place] [phone]  [facility]  [address] [place] [address] [phone]  [facility]  [address] [place] [address] [phone]  [facility]  [place] [phone]  [facility] Psychiatry Inpatient [facility]  [place] [phone]  [facility]  [address] [place], [place] [address] [phone]      Situation ongoing, CSW to continue following and update chart as more information becomes available.     [name], MSW, LCSW  12/02/2023 10:34 AM

## 2023-12-03 NOTE — ED Notes (Signed)
 Attempted calling husband about patient transfer

## 2023-12-03 NOTE — ED Provider Notes (Signed)
 Emergency Medicine Observation Re-evaluation Note  Amy Mcclain is a 38 y.o. female, seen on rounds today.  Pt initially presented to the ED for complaints of Psychiatric Evaluation Currently, the patient is resting.   Physical Exam  BP (!) 100/59 (BP Location: Left Arm)   Pulse 91   Temp 98.5 F (36.9 C) (Oral)   Resp 18   Ht 5' (1.524 m)   Wt 59 kg   LMP 08/04/2023   SpO2 99%   BMI 25.39 kg/m  Physical Exam General: NAD Cardiac: RR Lungs: Non labored  Psych: Calm   ED Course / MDM  EKG:   I have reviewed the labs performed to date as well as medications administered while in observation.  Recent changes in the last 24 hours include accepted to [facility] .  Plan  [facility] plan [facility] for [facility]. Amy Neysa Caron JINNY, DO 12/03/23 [phone]

## 2023-12-03 NOTE — ED Notes (Signed)
 Patient sleeping comfortably at this time, sitter at bedside

## 2024-05-08 ENCOUNTER — Encounter: Payer: Self-pay | Admitting: Obstetrics

## 2024-05-10 ENCOUNTER — Encounter: Payer: MEDICAID | Admitting: Obstetrics

## 2024-05-22 ENCOUNTER — Encounter: Payer: Self-pay | Admitting: *Deleted

## 2024-05-22 ENCOUNTER — Other Ambulatory Visit: Payer: Self-pay | Admitting: *Deleted

## 2024-05-29 ENCOUNTER — Inpatient Hospital Stay (HOSPITAL_COMMUNITY)
Admission: AD | Admit: 2024-05-29 | Discharge: 2024-05-31 | DRG: 776 | Disposition: A | Discharge status: Other Acute Inpatient Hospital | Payer: MEDICAID | Attending: Obstetrics & Gynecology | Admitting: Obstetrics & Gynecology

## 2024-05-29 ENCOUNTER — Encounter (HOSPITAL_COMMUNITY): Payer: Self-pay | Admitting: Family Medicine

## 2024-05-29 DIAGNOSIS — O99345 Other mental disorders complicating the puerperium: Secondary | ICD-10-CM | POA: Diagnosis present

## 2024-05-29 DIAGNOSIS — Z603 Acculturation difficulty: Secondary | ICD-10-CM | POA: Diagnosis present

## 2024-05-29 DIAGNOSIS — Z3A40 40 weeks gestation of pregnancy: Secondary | ICD-10-CM | POA: Diagnosis not present

## 2024-05-29 DIAGNOSIS — O4202 Full-term premature rupture of membranes, onset of labor within 24 hours of rupture: Secondary | ICD-10-CM | POA: Diagnosis not present

## 2024-05-29 DIAGNOSIS — Z91148 Patient's other noncompliance with medication regimen for other reason: Secondary | ICD-10-CM

## 2024-05-29 DIAGNOSIS — F203 Undifferentiated schizophrenia: Secondary | ICD-10-CM | POA: Diagnosis present

## 2024-05-29 DIAGNOSIS — Z8249 Family history of ischemic heart disease and other diseases of the circulatory system: Secondary | ICD-10-CM | POA: Diagnosis not present

## 2024-05-29 DIAGNOSIS — O480 Post-term pregnancy: Secondary | ICD-10-CM | POA: Diagnosis not present

## 2024-05-29 DIAGNOSIS — Z833 Family history of diabetes mellitus: Secondary | ICD-10-CM

## 2024-05-29 DIAGNOSIS — O0933 Supervision of pregnancy with insufficient antenatal care, third trimester: Secondary | ICD-10-CM | POA: Diagnosis not present

## 2024-05-29 DIAGNOSIS — Z881 Allergy status to other antibiotic agents status: Secondary | ICD-10-CM | POA: Diagnosis not present

## 2024-05-29 DIAGNOSIS — O99344 Other mental disorders complicating childbirth: Secondary | ICD-10-CM | POA: Diagnosis not present

## 2024-05-29 DIAGNOSIS — F29 Unspecified psychosis not due to a substance or known physiological condition: Secondary | ICD-10-CM | POA: Diagnosis not present

## 2024-05-29 LAB — RAPID HIV SCREEN (HIV 1/2 AB+AG)
HIV 1/2 Antibodies: NONREACTIVE
HIV-1 P24 Antigen - HIV24: NONREACTIVE

## 2024-05-29 LAB — CBC
HCT: 29.6 % — ABNORMAL LOW (ref 36.0–46.0)
Hemoglobin: 9.1 g/dL — ABNORMAL LOW (ref 12.0–15.0)
MCH: 23.9 pg — ABNORMAL LOW (ref 26.0–34.0)
MCHC: 30.7 g/dL (ref 30.0–36.0)
MCV: 77.7 fL — ABNORMAL LOW (ref 80.0–100.0)
Platelets: 349 K/uL (ref 150–400)
RBC: 3.81 MIL/uL — ABNORMAL LOW (ref 3.87–5.11)
RDW: 17.2 % — ABNORMAL HIGH (ref 11.5–15.5)
WBC: 16.8 K/uL — ABNORMAL HIGH (ref 4.0–10.5)
nRBC: 0 % (ref 0.0–0.2)

## 2024-05-29 LAB — HEPATITIS B SURFACE ANTIGEN: Hepatitis B Surface Ag: NONREACTIVE

## 2024-05-29 LAB — HIV ANTIBODY (ROUTINE TESTING W REFLEX): HIV Screen 4th Generation wRfx: NONREACTIVE

## 2024-05-29 MED ORDER — SIMETHICONE 80 MG PO CHEW: 80.0000 mg | CHEWABLE_TABLET | ORAL | Status: DC | PRN

## 2024-05-29 MED ORDER — SENNOSIDES-DOCUSATE SODIUM 8.6-50 MG PO TABS: 2.0000 | ORAL_TABLET | Freq: Every day | ORAL | Status: DC

## 2024-05-29 MED ORDER — TETANUS-DIPHTH-ACELL PERTUSSIS 5-2-15.5 LF-MCG/0.5 IM SUSP: 0.5000 mL | Freq: Once | INTRAMUSCULAR | Status: DC

## 2024-05-29 MED ORDER — IBUPROFEN 600 MG PO TABS: 600.0000 mg | ORAL_TABLET | Freq: Four times a day (QID) | ORAL | Status: DC

## 2024-05-29 MED ORDER — DIBUCAINE (PERIANAL) 1 % EX OINT: 1.0000 | TOPICAL_OINTMENT | CUTANEOUS | Status: DC | PRN

## 2024-05-29 MED ORDER — DIPHENHYDRAMINE HCL 25 MG PO CAPS: 25.0000 mg | ORAL_CAPSULE | Freq: Four times a day (QID) | ORAL | Status: DC | PRN

## 2024-05-29 MED ORDER — PRENATAL MULTIVITAMIN CH: 1.0000 | ORAL_TABLET | Freq: Every day | ORAL | Status: DC

## 2024-05-29 MED ORDER — WITCH HAZEL-GLYCERIN EX PADS: 1.0000 | MEDICATED_PAD | CUTANEOUS | Status: DC | PRN

## 2024-05-29 MED ORDER — ONDANSETRON HCL 4 MG/2ML IJ SOLN: 4.0000 mg | INTRAMUSCULAR | Status: DC | PRN

## 2024-05-29 MED ORDER — COCONUT OIL OIL: 1.0000 | TOPICAL_OIL | Status: DC | PRN

## 2024-05-29 MED ORDER — OLANZAPINE 5 MG PO TABS: 5.0000 mg | ORAL_TABLET | Freq: Every day | ORAL | Status: DC

## 2024-05-29 MED ORDER — ONDANSETRON HCL 4 MG PO TABS: 4.0000 mg | ORAL_TABLET | ORAL | Status: DC | PRN

## 2024-05-29 MED ORDER — BENZOCAINE-MENTHOL 20-0.5 % EX AERO: 1.0000 | INHALATION_SPRAY | CUTANEOUS | Status: DC | PRN

## 2024-05-29 MED ORDER — OLANZAPINE 10 MG IM SOLR
5.0000 mg | Freq: Every day | INTRAMUSCULAR | Status: DC
  Filled 2024-05-29 (×2): qty 10

## 2024-05-29 MED ORDER — OLANZAPINE 5 MG PO TABS
5.0000 mg | ORAL_TABLET | Freq: Every day | ORAL | Status: DC
  Filled 2024-05-29 (×2): qty 1

## 2024-05-29 MED ORDER — ZOLPIDEM TARTRATE 5 MG PO TABS: 5.0000 mg | ORAL_TABLET | Freq: Every evening | ORAL | Status: DC | PRN

## 2024-05-29 MED ORDER — ACETAMINOPHEN 325 MG PO TABS: 650.0000 mg | ORAL_TABLET | ORAL | Status: DC | PRN

## 2024-05-29 NOTE — H&P (Signed)
 Amy Mcclain is a 38 y.o. female presenting for postpartum care after delivering at home with no prenatal care except one ED visit 12/02/23, limited dating US . [redacted]w[redacted]d G6P4104. She has not taken any medication for schizophrenia. Baby is healthy and in warmer in the room  OB History     Gravida  6   Para  5   Term  4   Preterm  1   AB  0   Living  4      SAB  0   IAB  0   Ectopic  0   Multiple  0   Live Births  5          Past Medical History:  Diagnosis Date   Anemia    Depression    Schizophrenia (HCC)    Past Surgical History:  Procedure Laterality Date   NO PAST SURGERIES     Family History: family history includes Diabetes in her father and mother; Hypertension in her father and mother. Social History:  reports that she has never smoked. She has never used smokeless tobacco. She reports that she does not drink alcohol and does not use drugs.     Maternal Diabetes: No Genetic Screening: Declined Maternal Ultrasounds/Referrals: Other: Fetal Ultrasounds or other Referrals:  None Maternal Substance Abuse:  No Significant Maternal Medications:  None Significant Maternal Lab Results:  None Number of Prenatal Visits:Less than or equal to 3 verified prenatal visits Maternal Vaccinations:no Other Comments:  no care  Review of Systems Maternal Medical History:  Reason for admission: SROM at 0300 and delivered 0610      Blood pressure 124/80, pulse 86, temperature 98.8 F (37.1 C), temperature source Oral, resp. rate 15, last menstrual period 08/04/2023, SpO2 99%, unknown if currently breastfeeding. Maternal Exam:  Abdomen: Patient reports no abdominal tenderness. Fundal height is firm at umbilicus.   Introitus: Normal vulva.   Physical Exam Vitals and nursing note reviewed. Exam conducted with a chaperone present.  Constitutional:      Appearance: Normal appearance. She is not ill-appearing.  HENT:     Head: Normocephalic and atraumatic.   Cardiovascular:     Rate and Rhythm: Normal rate.  Pulmonary:     Effort: Pulmonary effort is normal.  Abdominal:     General: Abdomen is flat.  Genitourinary:    General: Normal vulva.  Musculoskeletal:     Cervical back: Normal range of motion.  Skin:    General: Skin is warm and dry.  Neurological:     Mental Status: She is alert.  Psychiatric:        Mood and Affect: Mood normal.        Behavior: Behavior normal.     Prenatal labs: ABO, Rh:   Antibody:   Rubella:   RPR:    HBsAg:    HIV:    GBS:     Assessment/Plan: S/p home delivery [redacted]w[redacted]d With healthy mail, placenta delivered intact   Lynwood Solomons 05/29/2024, 7:52 AM

## 2024-05-29 NOTE — Clinical Social Work Maternal (Signed)
 " CLINICAL SOCIAL WORK MATERNAL/CHILD NOTE  Patient Details  Name: Amy Mcclain MRN: [id number] Date of Birth: 03/20/1986  Date:  05/29/2024  Clinical Social Worker Initiating Note:  Tarin Johndrow Date/Time: Initiated:  05/29/24/1552     Child's Name:  Ezella Boy Kizer 05/29/2024   Biological Parents:  Mother, Father Kewanna Kasprzak 1984/09/28, Eletha, 05/31/1973)   Need for Interpreter:  Arabic   Reason for Referral:  Behavioral Health Concerns   Address:  [address] [place] [address]    Phone number:  [phone] (home)     Additional phone number:   Household Members/Support Persons (HM/SP):   Household Member/Support Person 1, Household Member/Support Person 2, Household Member/Support Person 3, Household Member/Support Person 4, Household Member/Support Person 5   HM/SP Name Relationship DOB or Age  HM/SP -1 Hamed Hinsley husband 05/31/1973  HM/SP -2 Rayan Ahmed daughter 06/16/2005  HM/SP -3 Ahmed Ahmed son 09/21/2007  HM/SP -4 Rital Ahmed daughter 04/20/2016  HM/SP -5 Khalid Ahmed son 11/17/2021  HM/SP -6        HM/SP -7        HM/SP -8          Natural Supports (not living in the home):      Professional Supports: None   Employment: Homemaker   Type of Work:     Education:  Engineer, agricultural   Homebound arranged:    Surveyor, Quantity Resources:  Medicaid   Other Resources:      Cultural/Religious Considerations Which May Impact Care:    Strengths:  Merchandiser, retail, Home prepared for child     Psychotropic Medications:         Pediatrician:    [employer] area  Pediatrician List:   [facility] ([address] [employer])  [place]    [place]      Pediatrician Fax Number:    Risk Factors/Current Problems:  Mental Health Concerns     Cognitive State:  Able to Concentrate  , Alert     Mood/Affect:  Calm  , Comfortable     CSW Assessment: CSW  received a consult for MOB hx of Schizophrenia and lack of prenatal care. CSW met with MOB to complete assessment and offer support. CSW entered the room and observed MOB resting in bed, holding the infant and her oldest daughter at bedside. CSW arranged [employer] language interpreter, MOB preferred her daughter translate for her. CSW introduced self, CSW role and reason for visit. MOB was agreeable to visit. CSW inquired about how MOB was feeling, MOB reported good. MOB presented alert  and oriented during the assessment no signs of psychosis at this time. CSW confirmed demographic information, MOB reported the address was correct but her number is [phone].   CSW inquired about MOB MH hx, MOB's daughter reported she has a confirmed diagnosis of schizophrenia. CSW inquired about current treatment, MOB reported none. MOB stated she does not feel like she needs medication. Daughter states MOB has has increased symptom of psychosis since being unmedicated. Daughter reported MOB having auditory and visual hallucinations, refusing to get medical care for her self or the infant. Daughter expressed that when MOB is taking her medication consistently she isfine and her MH is controlled.  Daughter desires MOB to be placed inpatient to receive the proper care for her MH. Daughter verbalized understanding regarding process and verbalized no concerns at this time. CSW  provided education regarding the baby blues period vs. perinatal mood disorders, discussed treatment and gave resources for mental health follow up if concerns arise.  CSW recommends self-evaluation during the postpartum time period using the New Mom Checklist from Postpartum Progress and encouraged MOB to contact a medical professional if symptoms are noted at any time. MOB identified FOB and her daughter as her primary supports.   CSW was provided IVC paperwork by psych NP, CSW submitted IVC paperwork to magistrate and IVC was approved. MOB currently under  IVC orders. MOB will require inpatient behavioral health placement, once medically stable.  CSW inquired about lack of [facility], daughter reported MOB just refused to go to appointments. Daughter reported she scheduled multiple appointments but MOB would refuse to go. CSW explained the hospital policy drug screen policy due to lack of prenatal care. MOB  and daughter verbalized understanding. CSW made CPS report due to MOB's current MH concerns to [facility] CPS,  CPS report screened out at this time. No barriers to infants discharge to FOB.  CSW provided review of Sudden Infant Death Syndrome (SIDS) precautions. CSW inquired about care for the infant, daughter reported she and her family including FOB will care for the infant. MOB reported she has all essential items for the infant including a crib and car seat. Barrier to MOB's discharge as inpatient placement is being located, no barriers to infant discharge at this time.   CSW Plan/Description:  Sudden Infant Death Syndrome (SIDS) Education, Perinatal Mood and Anxiety Disorder (PMADs) Education, Psychosocial Support and Ongoing Assessment of Needs, CSW Awaiting CPS Disposition Plan, Child Protective Service Report  , Hospital Drug Screen Policy Information, CSW Will Continue to Monitor Umbilical Cord Tissue Drug Screen Results and Make Report if Warranted    Tilley Faeth M Christabella Alvira, LCSW 05/29/2024, 4:10 PM  "

## 2024-05-29 NOTE — Discharge Summary (Shared)
 "    Postpartum Discharge Summary  Date of Service updated***     Patient Name: Amy Mcclain DOB: 02-08-1986 MRN: [id number]  Date of admission: 05/29/2024 Delivery date:05/29/2024 Delivering provider:   Date of discharge: 05/29/2024  Admitting diagnosis: Delivered at home Intrauterine pregnancy: [redacted]w[redacted]d     Secondary diagnosis:  Active Problems:   * No active hospital problems. *  Additional problems: ***    Discharge diagnosis: {DX.:23714}                                              Post partum procedures:{Postpartum procedures:23558} Augmentation: {Augmentation:20782} Complications: {OB Labor/Delivery Complications:20784}  Hospital course: {Courses:23701}  Magnesium  Sulfate received: {Mag received:30440022} BMZ received: {BMZ received:30440023} Rhophylac:{Rhophylac received:30440032} FFM:{FFM:69559966} T-DaP:{Tdap:23962} Flu: {Qol:76036} RSV Vaccine received: {RSV:31013} Transfusion:{Transfusion received:30440034}  Immunizations received: Immunization History  Administered Date(s) Administered   Influenza,inj,Quad PF,6+ Mos 10/28/2015, 03/03/2016   Tdap 01/20/2016    Physical exam  Vitals:   05/29/24 0741  BP: 124/80  Pulse: 86  Resp: 15  Temp: 98.8 F (37.1 C)  TempSrc: Oral  SpO2: 99%   General: {Exam; general:21111117} Lochia: {Desc; appropriate/inappropriate:30686::appropriate} Uterine Fundus: {Desc; firm/soft:30687} Incision: {Exam; incision:21111123} DVT Evaluation: {Exam; icu:7888877} Labs: Lab Results  Component Value Date   WBC 14.7 (H) 11/29/2023   HGB 10.5 (L) 11/29/2023   HCT 32.5 (L) 11/29/2023   MCV 89.5 11/29/2023   PLT 370 11/29/2023      Latest Ref Rng & Units 11/29/2023    8:00 PM  CMP  Glucose 70 - 99 mg/dL 891   BUN 6 - 20 mg/dL 6   Creatinine 9.55 - 8.99 mg/dL 9.56   Sodium 864 - 854 mmol/L 136   Potassium 3.5 - 5.1 mmol/L 3.8   Chloride 98 - 111 mmol/L 106   CO2 22 - 32 mmol/L 20   Calcium 8.9 - 10.3 mg/dL 8.9    Total Protein 6.5 - 8.1 g/dL 7.6   Total Bilirubin 0.0 - 1.2 mg/dL 0.5   Alkaline Phos 38 - 126 U/L 60   AST 15 - 41 U/L 20   ALT 0 - 44 U/L 12    Edinburgh Score:    11/17/2021    2:38 PM  Edinburgh Postnatal Depression Scale Screening Tool  I have been able to laugh and see the funny side of things. 0   I have looked forward with enjoyment to things. 0   I have blamed myself unnecessarily when things went wrong. 0   I have been anxious or worried for no good reason. 0   I have felt scared or panicky for no good reason. 0   Things have been getting on top of me. 0   I have been so unhappy that I have had difficulty sleeping. 0   I have felt sad or miserable. 0   I have been so unhappy that I have been crying. 0   The thought of harming myself has occurred to me. 0   Edinburgh Postnatal Depression Scale Total 0      Data saved with a previous flowsheet row definition   No data recorded  After visit meds:  Allergies as of 05/29/2024       Reactions   Ceftin [cefuroxime Axetil] Rash   bruising        Medication List    You have not been prescribed any  medications.      Discharge home in stable condition Infant Feeding: {Baby feeding:23562} Infant Disposition:{CHL IP OB HOME WITH FNUYZM:76418} Discharge instruction: per After Visit Summary and Postpartum booklet. Activity: Advance as tolerated. Pelvic rest for 6 weeks.  Diet: {OB ipzu:78888878} Future Appointments:No future appointments. Follow up Visit:   Please schedule this patient for a {Visit type:23955} postpartum visit in {Postpartum visit:23953} with the following provider: {Provider type:23954}. Additional Postpartum F/U:{PP Procedure:23957}  {Risk level:23960} pregnancy complicated by: {complication:23959} Delivery mode:  Vaginal, Spontaneous Anticipated Birth Control:  {Birth Control:23956}   05/29/2024 Lynwood Solomons, MD    "

## 2024-05-29 NOTE — Progress Notes (Signed)
 Patient refusing fundal massage.  Patient refusing medications.  Will continue to monitor bleeding.

## 2024-05-29 NOTE — Consult Note (Signed)
 [facility] Psychiatric Consult Initial  Patient Name: .Amy Mcclain  MRN: [id number]  DOB: Apr 24, 1986  Consult Order details:  Orders (From admission, onward)     Start     Ordered   05/29/24 1133  IP CONSULT TO PSYCHIATRY       Ordering Provider: Vannie Cornell SAUNDERS, CNM  Provider:  (Not yet assigned)  Question Answer Comment  Location [facility]   Reason for Consult? Hx active, untreated schizophrenia      05/29/24 1132             Mode of Visit: In person    Psychiatry Consult Evaluation  Service Date: May 29, 2024 LOS:  LOS: 0 days  Chief Complaint psychosis, refusing care  Primary Psychiatric Diagnoses  Schizophrenia, undifferentiated   Assessment  Amy Mcclain is a 38 y.o. female admitted: Medicallyfor 05/29/2024  7:10 AM for postpartum care after delivering her baby at home. She carries the psychiatric diagnoses of schizophrenia, undifferentiated.  Her current presentation of paranoid delusions about her neighbors, odd behaviors, smelling urine and blood, disorganized behaviors and past history of symptoms is most consistent with schizophrenia, undifferentiated.  She was non compliant with medications prior to admission as evidenced by family reporting she was not taking her medications or going to the doctor. On initial examination, patient was attempting to breast feed her baby.  Appeared tired. Please see plan below for detailed recommendations.   Diagnoses:  Active Hospital problems: Active Problems:   Schizophrenia, undifferentiated (HCC)    Plan   ## Psychiatric Medication Recommendations:  Zyprexa  5 mg oral or IM daily, permission obtained from her husband and IVC is in place  ## Medical Decision Making Capacity: Not specifically addressed in this encounter  ## Further Work-up:  -- most recent EKG on 10/17/2017 had QtC of 449 -- Pertinent labwork reviewed earlier this admission includes: refused labs   ## Disposition:-- We  recommend inpatient psychiatric hospitalization after medical hospitalization. Patient has been involuntarily committed on 05/29/2024.   ## Behavioral / Environmental: -Utilize compassion and acknowledge the patient's experiences while setting clear and realistic expectations for care.    ## Safety and Observation Level:  - Based on my clinical evaluation, I estimate the patient to be at low risk of self harm in the current setting. - At this time, we recommend  1:1 Observation. This decision is based on my review of the chart including patient's history and current presentation, interview of the patient, mental status examination, and consideration of suicide risk including evaluating suicidal ideation, plan, intent, suicidal or self-harm behaviors, risk factors, and protective factors. This judgment is based on our ability to directly address suicide risk, implement suicide prevention strategies, and develop a safety plan while the patient is in the clinical setting. Please contact our team if there is a concern that risk level has changed.  CSSR Risk Category:   Suicide Risk Assessment: Patient has following modifiable risk factors for suicide: active mental illness (to encompass adhd, tbi, mania, psychosis, trauma reaction), which we are addressing by starting medication and recommending inpatient psychiatric hospitalization. Patient has following non-modifiable or demographic risk factors for suicide: psychiatric hospitalization Patient has the following protective factors against suicide: Access to outpatient mental health care, Supportive family, and Minor children in the home  Thank you for this consult request. Recommendations have been communicated to the primary team.  We will continue to follow at this time.   Amy Becker, NP  History of Present Illness  Relevant Aspects of Hospital Hospital Course:  Admitted on 05/29/2024 after the birth of her baby at home, psychotic and  refused care--past and present.  Patient Report:  The client with a history of schizophrenia minimizes her symptoms and denies depression, anxiety, and psychosis among other things.  However, her oldest child, 23 yo daughter, is in the room with her.  She reported her mother was functioning well considering she had not been on medications for the past couple of years due to her previous pregnancy and breastfeeding until 06/22/23.  Then, she became paranoid about the neighbors and feeling they were doing things they were not.  She was also smelling blood and urine many times.  Her sleep schedule has been off with sleeping at times during the day and staying up during the night at times.  In July, this daughter took out an IVC and she went to [facility] prior to transferring to [facility].  This is when they discovered she was pregnant.  When she returned home from [facility], she refused to take her medication despite the daughter and family members encouragements.  She also refused to go to doctor's appointments including prenatal care.  Last night, she went into labor in the middle of the night and refused to come to the hospital.  She delivered about 3 hours later and then went to the hospital, baby is fine.  Amy Mcclain continues to refuse care in the hospital (V/S, labs, fundal checks, etc.).  Her daughter requested medications and an IVC so her mother would take her medications.  When entering the room, the patient was holding her baby appropriately and then tried to get him to breastfeed with minimal success; appeared very tired.  The family came to the [place]  from [place] in 22-Jun-2015 and her mother died the summer of June 21, 2022.  She stays home and takes care of the family.  No issues eating per the daughter.  She has been cooking for the family and performing household chores.    The daughter worked with the interpreter and then solo.  Her English is impeccable and also fluent in Arabic, very polite.  She was  planning on taking out an IVC so medicine could be started.  This provider let her know that she would be completing the IVC and it would not be necessary for her.  The husband was called and he agreed to [facility] to start Zyprexa  which is a safer option for psychosis with pregnant and breastfeeding mothers.    Psych ROS:  Depression: denied Anxiety:  denied Mania (lifetime and current): none Psychosis: (lifetime and current): delusions, paranoia  Collateral information:  Contacted 21 yo daughter at bedside during the assessment and supplied most of the information above.  She was going to take out an IVC if the hospital did not as she wants her mother to take medications.  The patient was functioning well until 2024/06/21 when she stated having paranoia about the neighbors, smelling blood and urine, and odd behaviors including sleep patterns.  Review of Systems  All other systems reviewed and are negative.    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 Vital Signs:  Temp:  [98.1 F (36.7 C)-98.8 F (37.1 C)] 98.1 F (36.7 C) (12/30 1034) Pulse Rate:  [84-86] 84 (12/30 1034) Resp:  [15] 15 (12/30 0741) BP: (113-124)/(73-80) 113/73 (12/30 1034) SpO2:  [99 %-100 %] 100 % (12/30 1034) Blood pressure 113/73, pulse 84, temperature 98.1 F (36.7 C), resp. rate 15, last menstrual period 08/04/2023, SpO2  100%, unknown if currently breastfeeding. There is no height or weight on file to calculate BMI.  Physical Exam  Mental Status Exam: General Appearance: Casual  Orientation:  Full (Time, Place, and Person)  Memory:  Immediate;   Fair Recent;   Fair Remote;   Fair  Concentration:  Concentration: Fair and Attention Span: Fair  Recall:  Fair  Attention  Fair  Eye Contact:  Fair  Speech:  Clear and Coherent  Language:  Fair  Volume:  Normal  Mood: denies depression  Affect:  Blunt  Thought Process:  Disorganized at times per chart  Thought Content:  Delusions and Paranoid Ideation  Suicidal Thoughts:  No  Homicidal Thoughts:  No  Judgement:  Impaired  Insight:  Lacking  Psychomotor Activity:  Decreased  Akathisia:  No  Fund of Knowledge:  UTA      Assets:  Communication Skills Housing Intimacy Leisure Time Physical Health Resilience Social Support Transportation  Cognition:  WNL  ADL's:  Intact  AIMS (if indicated):        Other History   These have been pulled in through the EMR, reviewed, and updated if appropriate.  Family History:  The patient's family history includes Diabetes in her father and mother; Hypertension in her father and mother.  Medical History: Past Medical History:  Diagnosis Date   Anemia    Depression    Schizophrenia (HCC)     Surgical History: Past Surgical History:  Procedure Laterality Date   NO PAST SURGERIES       Medications:  Current Medications[1]  Allergies: Allergies[2]  Amy Becker, NP     [1]  Current Facility-Administered Medications:    acetaminophen  (TYLENOL ) tablet 650 mg, 650 mg, Oral, Q4H PRN, Eveline Lynwood MATSU, MD   benzocaine -Menthol  (DERMOPLAST) 20-0.5 % topical spray 1 Application, 1 Application, Topical, PRN, Arnold, James G, MD   coconut oil, 1 Application, Topical, PRN, Arnold, James G, MD   witch hazel-glycerin  (TUCKS) pad 1 Application, 1 Application, Topical, PRN **AND** dibucaine (NUPERCAINAL)  1 % rectal ointment 1 Application, 1 Application, Rectal, PRN, Eveline Lynwood MATSU, MD   diphenhydrAMINE  (BENADRYL ) capsule 25 mg, 25 mg, Oral, Q6H PRN, Eveline Lynwood MATSU, MD   ibuprofen  (ADVIL ) tablet 600 mg, 600 mg, Oral, Q6H, Eveline Lynwood MATSU, MD   ondansetron  (ZOFRAN ) tablet 4 mg, 4 mg, Oral, Q4H PRN **OR** ondansetron  (ZOFRAN ) injection 4 mg, 4 mg, Intravenous, Q4H PRN, Eveline Lynwood MATSU, MD   prenatal multivitamin tablet 1 tablet, 1 tablet, Oral, Q1200, Eveline Lynwood MATSU, MD   [START ON 05/30/2024] senna-docusate (Senokot-S) tablet 2 tablet, 2 tablet, Oral, Daily, Eveline Lynwood MATSU, MD   simethicone  [facility]) chewable tablet 80 mg, 80 mg, Oral, PRN, Eveline Lynwood MATSU, MD   [START ON 05/30/2024] Tdap (ADACEL) injection 0.5 mL, 0.5 mL, Intramuscular, Once, Eveline Lynwood MATSU, MD   zolpidem  (AMBIEN ) tablet 5 mg, 5 mg, Oral, QHS PRN,  Eveline Lynwood MATSU, MD [2]  Allergies Allergen Reactions   Ceftin [Cefuroxime Axetil] Rash    bruising

## 2024-05-29 NOTE — Progress Notes (Signed)
 Patient refused CBC. Faculty notified.Daughter requested to see doctor also about starting Schizophrenia shots. Doctor said will  come to see.

## 2024-05-29 NOTE — Progress Notes (Signed)
 Patient let lab draw her labs. Refused baby labs at the moment.

## 2024-05-29 NOTE — Progress Notes (Signed)
 Asked pt if she would take her zyprexa  medicine for us  and she said she did not want to take it. Will continue to encourage the medicine.

## 2024-05-30 ENCOUNTER — Other Ambulatory Visit (HOSPITAL_COMMUNITY): Payer: Self-pay

## 2024-05-30 LAB — GC/CHLAMYDIA PROBE AMP (~~LOC~~) NOT AT ARMC
Comment: NEGATIVE
Comment: NORMAL

## 2024-05-30 LAB — LIPID PANEL
Cholesterol: 311 mg/dL — ABNORMAL HIGH (ref 0–200)
HDL: 55 mg/dL
LDL Cholesterol: 217 mg/dL — ABNORMAL HIGH (ref 0–99)
Total CHOL/HDL Ratio: 5.7 ratio
Triglycerides: 196 mg/dL — ABNORMAL HIGH
VLDL: 39 mg/dL (ref 0–40)

## 2024-05-30 LAB — URINE DRUG SCREEN
Amphetamines: NEGATIVE
Barbiturates: NEGATIVE
Benzodiazepines: NEGATIVE
Cocaine: NEGATIVE
Fentanyl: NEGATIVE
Methadone Scn, Ur: NEGATIVE
Opiates: NEGATIVE
Tetrahydrocannabinol: NEGATIVE

## 2024-05-30 LAB — COMPREHENSIVE METABOLIC PANEL WITH GFR
ALT: 11 U/L (ref 0–44)
AST: 25 U/L (ref 15–41)
Albumin: 3.4 g/dL — ABNORMAL LOW (ref 3.5–5.0)
Alkaline Phosphatase: 154 U/L — ABNORMAL HIGH (ref 38–126)
Anion gap: 14 (ref 5–15)
BUN: 7 mg/dL (ref 6–20)
CO2: 17 mmol/L — ABNORMAL LOW (ref 22–32)
Calcium: 8.8 mg/dL — ABNORMAL LOW (ref 8.9–10.3)
Chloride: 102 mmol/L (ref 98–111)
Creatinine, Ser: 0.45 mg/dL (ref 0.44–1.00)
GFR, Estimated: >60 mL/min
Glucose, Bld: 115 mg/dL — ABNORMAL HIGH (ref 70–99)
Potassium: 4.3 mmol/L (ref 3.5–5.1)
Sodium: 134 mmol/L — ABNORMAL LOW (ref 135–145)
Total Bilirubin: 0.3 mg/dL (ref 0.0–1.2)
Total Protein: 5.7 g/dL — ABNORMAL LOW (ref 6.5–8.1)

## 2024-05-30 LAB — HEMOGLOBIN A1C
Hgb A1c MFr Bld: 5 % (ref 4.8–5.6)
Mean Plasma Glucose: 96.8 mg/dL

## 2024-05-30 LAB — SYPHILIS: RPR W/REFLEX TO RPR TITER AND TREPONEMAL ANTIBODIES, TRADITIONAL SCREENING AND DIAGNOSIS ALGORITHM: RPR Ser Ql: NONREACTIVE

## 2024-05-30 MED ORDER — IBUPROFEN 600 MG PO TABS
600.0000 mg | ORAL_TABLET | Freq: Four times a day (QID) | ORAL | 0 refills | Status: DC
  Filled 2024-05-30: qty 30, 8d supply, fill #0

## 2024-05-30 MED ORDER — FERROUS SULFATE 325 (65 FE) MG PO TABS: 325.0000 mg | ORAL_TABLET | Freq: Every day | ORAL | Status: DC

## 2024-05-30 MED ORDER — OLANZAPINE 5 MG PO TABS
5.0000 mg | ORAL_TABLET | Freq: Every day | ORAL | 1 refills | Status: DC
  Filled 2024-05-30: qty 30, 30d supply, fill #0

## 2024-05-30 MED ORDER — PRENATAL MULTIVITAMIN CH
1.0000 | ORAL_TABLET | Freq: Every day | ORAL | 5 refills | Status: DC
  Filled 2024-05-30: qty 30, 30d supply, fill #0
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
## 2024-05-30 NOTE — Progress Notes (Signed)
 Post Partum Day 1 Subjective: up ad lib, voiding, and tolerating PO Breast and formula feeding   Objective: Blood pressure 117/83, pulse 91, temperature 98.6 F (37 C), temperature source Oral, resp. rate 18, last menstrual period 08/04/2023, SpO2 100%, unknown if currently breastfeeding.  Physical Exam:  General: alert, cooperative, and no distress Lochia: appropriate Incision: n/a DVT Evaluation: No evidence of DVT seen on physical exam.  Recent Labs    05/29/24 1333  HGB 9.1*  HCT 29.6*    Assessment/Plan: Followed by social work and psychiatry Psychiatry sent a message for placement with [facility] after we d/c her  Declining Zyprexa   Hgb 9.1, orders placed for oral iron    LOS: 1 day   Nidia Daring, FNP

## 2024-05-30 NOTE — Progress Notes (Signed)
 Delivered TOC meds to patient's room and discussed discharge medications and discharge instructions using video interpreter. Patient's significant other in room during discharge instructions. All questions and concerns answered. Report called to Oddis Birmingham, RN to [facility] BMU unit that patient will be IVC'd to. [place] [employer] called to notify of need of transport to [facility]. Awaiting [place] [employer] for transport. Tammy, Steva [place]

## 2024-05-30 NOTE — Lactation Note (Signed)
 This note was copied from a baby's chart. Lactation Consultation Note  Patient Name: Amy Mcclain Date: 05/30/2024 Age:39 hours Reason for consult: Follow-up assessment;MD order;Term  P5, Daughter interpreting.  Mother has been breastfeeding and supplementing with formula. Request by MD to check on pump situation in case mother is separated from her infant. Mother and daughter state they have a pump at home but are unsure if it has all it's parts. Daughter will check on it and let Lactation know the status. Daughter states if it is not in working condition, she plans to purchase a pump for her mother. Suggest calling her insurance company to check on a pump.  Provided mother with a manual pump fitted with a 18 mm flange. Mother pumped 4 ml which she will give to baby when he wakes. Provided milk storage guidelines.  Suggest calling for help as needed.    Maternal Data Does the patient have breastfeeding experience prior to this delivery?: Yes  Feeding Mother's Current Feeding Choice: Breast Milk and Formula   Lactation Tools Discussed/Used Tools: Pump;Flanges Flange Size: 18 Breast pump type: Manual Pump Education: Milk Storage;Setup, frequency, and cleaning Reason for Pumping: stimulation and supplementation Pumping frequency: PRN/ if seperated q 4 hours for 15 min Pumped volume: 4 mL  Interventions Interventions: Education;Hand pump, CDC milk storage guidelines.  Discharge Pump: Personal;Manual (Family checking on personal pump)  Consult Status Consult Status: PRN  Shannon Levorn Lemme  RN, IBCLC 05/30/2024, 1:46 PM

## 2024-05-30 NOTE — Lactation Note (Signed)
 This note was copied from a baby's chart. Lactation Consultation Note  Patient Name: Amy Mcclain Date: 05/30/2024 Age:39 hours Reason for consult: Term;Initial assessment  P5. Mom BF all of her children except 1. BF ranged from 3 months to 1 yr.  Dad is interpreting for mom. Mom states she isn't having any trouble w/the BF this baby. Suggested if baby gets on and it feels tight or hurting to pull chin down a little to widen flange. Mom did so and she stated much better. Elevated the back of bed w/mom permission to back support. Mom thanked LC. Newborn feeding habits, behavior, STS, I&O, body alignment, and supplementing reviewed. Mom encouraged to feed baby 8-12 times/24 hours and with feeding cues.  Dad stated mom is putting baby to the breast first then if the baby is still hungry mom gives formula if needed. Mom done hand expression demonstrating colostrum. Praised mom. Encouraged mom to call for assistance as needed. Maternal Data Has patient been taught Hand Expression?: No Does the patient have breastfeeding experience prior to this delivery?: Yes How long did the patient breastfeed?: 3 months to 1 yr  Feeding    LATCH Score Latch: Grasps breast easily, tongue down, lips flanged, rhythmical sucking.  Audible Swallowing: A few with stimulation  Type of Nipple: Everted at rest and after stimulation  Comfort (Breast/Nipple): Soft / non-tender  Hold (Positioning): No assistance needed to correctly position infant at breast.  LATCH Score: 9   Lactation Tools Discussed/Used    Interventions Interventions: Breast feeding basics reviewed;Assisted with latch;Skin to skin;Adjust position;Support pillows;Education;LC Services brochure  Discharge    Consult Status Consult Status: Complete    Sharman Garrott G 05/30/2024, 4:32 AM

## 2024-05-30 NOTE — Progress Notes (Signed)
 CNM to bedside to discuss with patient importance of obtaining labs. Patient declining labs, states the doctor got all the labs that were needed yesterday.  CNM discussed that lab values can change over time and we would like to get new labs to keep her safe. She still declines labs.   Message sent to [facility] NP for further guidance. Previously informed labs were necessary prior to transport to [facility] unit.   - Remote Arabic interpreter used for entire episode of care.   Camie Rote, MSN, CNM, RNC-OB Certified Nurse Midwife, [facility] 05/30/2024 3:10 PM

## 2024-05-30 NOTE — Patient Instructions (Signed)
 If you are interested in an outpatient lactation consultation -- available in-office or virtually -- please reach out to us  at:  [facility] (First Floor) ?? [address], [place], [place]  ?? [phone] Please leave a message on our lactation voicemail box. We welcome any lactation-related questions or concerns -- our team is here to support you and your baby.  Lactation Support Groups Join us  at: [facility] ?? Tuesdays, 10:00 AM - 12:00 PM ?? [address], [place] [employer], [place]  Lactating parents and lap babies are welcome!  ?? [email or web address]  ?? [email or web address] -------------     Amy Mcclain, IBCLC [facility]

## 2024-05-31 ENCOUNTER — Encounter: Payer: Self-pay | Admitting: Psychiatry

## 2024-05-31 ENCOUNTER — Other Ambulatory Visit: Payer: Self-pay

## 2024-05-31 ENCOUNTER — Inpatient Hospital Stay
Admission: RE | Admit: 2024-05-31 | Discharge: 2024-06-15 | DRG: 885 | Disposition: A | Discharge status: Home / Self Care | Payer: MEDICAID | Source: Intra-hospital | Attending: Psychiatry | Admitting: Psychiatry

## 2024-05-31 DIAGNOSIS — Z91148 Patient's other noncompliance with medication regimen for other reason: Secondary | ICD-10-CM | POA: Diagnosis not present

## 2024-05-31 DIAGNOSIS — F203 Undifferentiated schizophrenia: Secondary | ICD-10-CM | POA: Diagnosis not present

## 2024-05-31 MED ORDER — OLANZAPINE 10 MG IM SOLR: 5.0000 mg | Freq: Every day | INTRAMUSCULAR | Status: DC

## 2024-05-31 MED ORDER — SENNOSIDES-DOCUSATE SODIUM 8.6-50 MG PO TABS
2.0000 | ORAL_TABLET | Freq: Every day | ORAL | Status: DC
  Filled 2024-05-31 (×8): qty 2

## 2024-05-31 MED ORDER — DIPHENHYDRAMINE HCL 25 MG PO CAPS: 25.0000 mg | ORAL_CAPSULE | Freq: Four times a day (QID) | ORAL | Status: DC | PRN

## 2024-05-31 MED ORDER — DIBUCAINE (PERIANAL) 1 % EX OINT: 1.0000 | TOPICAL_OINTMENT | CUTANEOUS | Status: DC | PRN

## 2024-05-31 MED ORDER — COCONUT OIL OIL: 1.0000 | TOPICAL_OIL | Status: DC | PRN

## 2024-05-31 MED ORDER — IBUPROFEN 600 MG PO TABS
600.0000 mg | ORAL_TABLET | Freq: Four times a day (QID) | ORAL | Status: DC
  Administered 2024-06-07 – 2024-06-11 (×3): 600 mg via ORAL
  Filled 2024-05-31 (×9): qty 1

## 2024-05-31 MED ORDER — OLANZAPINE 5 MG PO TABS
5.0000 mg | ORAL_TABLET | Freq: Every day | ORAL | Status: DC
  Filled 2024-05-31: qty 1

## 2024-05-31 MED ORDER — FERROUS SULFATE 325 (65 FE) MG PO TABS
325.0000 mg | ORAL_TABLET | Freq: Every day | ORAL | Status: DC
  Filled 2024-05-31: qty 1

## 2024-05-31 MED ORDER — WITCH HAZEL-GLYCERIN EX PADS: 1.0000 | MEDICATED_PAD | CUTANEOUS | Status: DC | PRN

## 2024-05-31 MED ORDER — BENZOCAINE-MENTHOL 20-0.5 % EX AERO: 1.0000 | INHALATION_SPRAY | CUTANEOUS | Status: DC | PRN

## 2024-05-31 MED ORDER — ZOLPIDEM TARTRATE 5 MG PO TABS
5.0000 mg | ORAL_TABLET | Freq: Every evening | ORAL | Status: DC | PRN
  Administered 2024-06-03 – 2024-06-13 (×8): 5 mg via ORAL
  Filled 2024-05-31 (×8): qty 1

## 2024-05-31 MED ORDER — OLANZAPINE 10 MG IM SOLR
5.0000 mg | Freq: Three times a day (TID) | INTRAMUSCULAR | Status: DC | PRN
  Filled 2024-05-31: qty 10

## 2024-05-31 MED ORDER — PRENATAL MULTIVITAMIN CH
1.0000 | ORAL_TABLET | Freq: Every day | ORAL | Status: DC
  Filled 2024-05-31 (×3): qty 1

## 2024-05-31 MED ORDER — ALUM & MAG HYDROXIDE-SIMETH 200-200-20 MG/5ML PO SUSP: 30.0000 mL | ORAL | Status: DC | PRN

## 2024-05-31 MED ORDER — SIMETHICONE 80 MG PO CHEW: 80.0000 mg | CHEWABLE_TABLET | ORAL | Status: DC | PRN

## 2024-05-31 MED ORDER — MAGNESIUM HYDROXIDE 400 MG/5ML PO SUSP: 30.0000 mL | Freq: Every day | ORAL | Status: DC | PRN

## 2024-05-31 MED ORDER — ACETAMINOPHEN 325 MG PO TABS: 650.0000 mg | ORAL_TABLET | ORAL | Status: DC | PRN

## 2024-05-31 NOTE — Progress Notes (Signed)
 Report called to [facility]. RN at [facility]. Tennie Suzen BRAVO, RN

## 2024-05-31 NOTE — Group Note (Signed)
 Date:  05/31/2024 Time:  10:26 AM  Group Topic/Focus:  MTH went over the rules and expectations on this unit for the patients, Identified who the nurses and techs were while also answering patient questions.    Participation Level:  Did Not Attend  Leigh VEAR Pais 05/31/2024, 10:26 AM

## 2024-05-31 NOTE — Group Note (Signed)
 Date:  05/31/2024 Time:  8:41 PM  Group Topic/Focus:  Conflict Resolution:   The focus of this group is to discuss the conflict resolution process and how it may be used upon discharge.    Pt did not attend group.  Azelia Reiger L 05/31/2024, 8:41 PM

## 2024-05-31 NOTE — BHH Suicide Risk Assessment (Addendum)
 [facility] Admission Suicide Risk Assessment   Nursing information obtained from:    Demographic factors:    Current Mental Status:    Loss Factors:    Historical Factors:    Risk Reduction Factors:     Total Time spent with patient: 45 minutes Principal Problem: Schizophrenia, undifferentiated (HCC) Diagnosis:  Principal Problem:   Schizophrenia, undifferentiated (HCC)  Subjective Data: CHIEF COMPLAINT  Im fine. I can go home.  HISTORY OF PRESENT ILLNESS  [name] is a 39 year old postpartum female with a known history of schizophrenia, undifferentiated type, admitted following home delivery of an infant and subsequent psychiatric consultation due to psychotic decompensation, medication noncompliance, refusal of medical care, and impaired capacity.  Collateral history reveals that the patient discontinued psychiatric medications several years ago related to pregnancy and breastfeeding. Since approximately January 2025, she has demonstrated progressive psychotic symptoms, including:  Paranoid delusions regarding neighbors  Olfactory hallucinations (smelling blood and urine)  Disorganized and odd behaviors  Reversal of sleep-wake cycle  Increasing refusal of medical and psychiatric care  In July 2025, she was involuntarily hospitalized at [facility] and later transferred to [facility], at which time pregnancy was discovered. After discharge, she refused all medications and follow-up, despite family encouragement.  The night prior to admission, the patient went into labor at home, refused hospital evaluation, delivered at home, and subsequently presented to the hospital postpartum. Since admission, she has refused vital signs, laboratory testing, and fundal checks. Family requested involuntary commitment due to concern for worsening psychosis and inability to engage in care.  During evaluation, the patient minimizes symptoms, denies psychiatric illness, and expresses desire to  leave the hospital. Although she denies psychotic symptoms, extensive collateral confirms active paranoia, hallucinations, and impaired judgment. Given refusal of car   Continued Clinical Symptoms:  Alcohol Use Disorder Identification Test Final Score (AUDIT): 0 The Alcohol Use Disorders Identification Test, Guidelines for Use in Primary Care, Second Edition.  World Science Writer [facility]). Score between 0-7:  no or low risk or alcohol related problems. Score between 8-15:  moderate risk of alcohol related problems. Score between 16-19:  high risk of alcohol related problems. Score 20 or above:  warrants further diagnostic evaluation for alcohol dependence and treatment.   CLINICAL FACTORS:   Schizophrenia:   Paranoid or undifferentiated type   Musculoskeletal: Strength & Muscle Tone: within normal limits Gait & Station: normal Patient leans: N/A  Psychiatric Specialty Exam:  Patient is a 39 year old female who appears stated age alert oriented x 3 mood is okay affect is constricted thought process concrete denies any current suicidal or homicidal thoughts minimizes behaviors Insight and judgment limited   Physical Exam: Physical Exam ROS Last menstrual period 08/04/2023, unknown if currently breastfeeding. There is no height or weight on file to calculate BMI.   COGNITIVE FEATURES THAT CONTRIBUTE TO RISK:  Closed-mindedness    SUICIDE RISK:   Moderate:  Frequent suicidal ideation with limited intensity, and duration, some specificity in terms of plans, no associated intent, good self-control, limited dysphoria/symptomatology, some risk factors present, and identifiable protective factors, including available and accessible social support.  PLAN OF CARE: Will start the patient on Zyprexa  5 mg at night  I certify that inpatient services furnished can reasonably be expected to improve the patient's condition.   Millie JONELLE Manners, MD 05/31/2024, 11:43 AM

## 2024-05-31 NOTE — L&D Delivery Note (Signed)
 Patient delivered at home. Placenta delivered intact after arriving in L&D as described in H&P  Amy Lynwood MATSU, MD

## 2024-05-31 NOTE — Group Note (Signed)
 Recreation Therapy Group Note   Group Topic:General Recreation  Group Date: 05/31/2024 Start Time: 1445 End Time: 1515 Facilitators: Celestia Jeoffrey BRAVO, LRT, CTRS Location: Courtyard  Group Description: [employer]. LRT and patients played games of basketball, drew with chalk, and played corn hole while outside in the courtyard while getting fresh air and sunlight. Music was being played in the background. LRT and peers conversed about different games they have played before, what they do in their free time and anything else that is on their minds. LRT encouraged pts to drink water  after being outside, sweating and getting their heart rate up.  Goal Area(s) Addressed: Patient will build on frustration tolerance skills. Patients will partake in a competitive play game with peers. Patients will gain knowledge of new leisure interest/hobby.   Affect/Mood: N/A   Participation Level: Did not attend    Clinical Observations/Individualized Feedback: Patient did not attend.  Plan: Continue to engage patient in RT group sessions 2-3x/week.   Jeoffrey BRAVO Celestia, LRT, CTRS 05/31/2024 4:42 PM

## 2024-05-31 NOTE — Group Note (Signed)
 Recreation Therapy Group Note   Group Topic:Communication  Group Date: 05/31/2024 Start Time: 1410 End Time: 1445 Facilitators: Celestia Jeoffrey BRAVO, LRT, CTRS Location: Craft Room  Group Description: Resolution Charades. Each group member writes down a New Years resolution theyre considering (e.g., spend more time outdoors or practice mindfulness) onto a small slip of paper. Resolutions are folded and placed in a bowl. One person picks a resolution and acts it out without words while the group guesses. Once the resolution is guessed, the group discusses how achieving this goal could be meaningful.  Goal Area(s) Addressed:  [employer] and self-expression. Create a supportive space for sharing personal goals. Encourage reflection on individual aspirations. Nonverbal communication. Active listening, teamwork and team building.   Affect/Mood: N/A   Participation Level: Did not attend    Clinical Observations/Individualized Feedback: Patient did not attend.  Plan: Continue to engage patient in RT group sessions 2-3x/week.   Jeoffrey BRAVO Celestia, LRT, CTRS 05/31/2024 3:50 PM

## 2024-05-31 NOTE — Progress Notes (Signed)
.  Admission Note:  39 yr old Arabic speaking female who presents IVC in from [facility]. Pt did not present in any acute distress for the acute psychosis and delusional d/o. Pt appears flat and depressed. Pt was angry, agitated and uncooperative initially getting her skin assessed and was adamant about leaving now, I don't belong here I didn't do anything wrong .I want to go and feed my baby, referring to breast feeding.Pt denied SI/HI plan or intent  and contracts for safety.  Pt denies AVH, stated she was just cautious and afraid its like anyone else it's could be because of the countries fighting each other. Pt  reports being 2 days post-partum, and all information was obtain via interpreter. Skin was assessed after a lot or cohering and found to  with dark skin rash to abdomen arm and legs. Pt's feet was soiled and she was dressed in layers of clothing.  PT searched and no contraband found, POC and unit policies explained and understanding verbalized. Pt refused to sign consents. Food and fluids offered, and accepted. Pt continued to request to leave in spite of competitive orientation on the discharge process.

## 2024-05-31 NOTE — BH Assessment (Signed)
 Brief back up call note   Received a call at 7:53 AM from the [facility] provider. The provider would like to have a guidance around possible forced medication due to the current condition.   The patient is to be transferred to [facility] psych unit, and the sheriff is there to escort her. However, the patient refuses to leave the room. The patient is under IVC.  There is no behavioral issues such as aggression, and no imminent concern of safety to others/self. She remains calm in the room. She has prn oral olanzapine , which is not offered yet.   The provider is advised to offer olanzapine  (with the goal of sedation) if the patient is not redirectable for the transfer. Further pharmacological intervention will be deferred at this time, especially given the absence of behavioral issues. The provider is advised to continue verbal redirection and reach out to the primary consult team.

## 2024-05-31 NOTE — Group Note (Signed)
 [facility] LCSW Group Therapy Note   Group Date: 05/31/2024 Start Time: 1000 End Time: 1100   Type of Therapy/Topic:  Group Therapy:  Balance in Life  Participation Level:  Did Not Attend   Description of Group:    This group will address the concept of balance and how it feels and looks when one is unbalanced. Patients will be encouraged to process areas in their lives that are out of balance, and identify reasons for remaining unbalanced. Facilitators will guide patients utilizing problem- solving interventions to address and correct the stressor making their life unbalanced. Understanding and applying boundaries will be explored and addressed for obtaining  and maintaining a balanced life. Patients will be encouraged to explore ways to assertively make their unbalanced needs known to significant others in their lives, using other group members and facilitator for support and feedback.  Therapeutic Goals: Patient will identify two or more emotions or situations they have that consume much of in their lives. Patient will identify signs/triggers that life has become out of balance:  Patient will identify two ways to set boundaries in order to achieve balance in their lives:  Patient will demonstrate ability to communicate their needs through discussion and/or role plays  Summary of Patient Progress:    Patient did not attend.     Therapeutic Modalities:   Cognitive Behavioral Therapy Solution-Focused Therapy Assertiveness Training   Alveta CHRISTELLA Kerns, LCSW

## 2024-05-31 NOTE — H&P (Addendum)
 " PSYCHIATRIC ADMISSION HISTORY & PHYSICAL  Patient Name: Amy Mcclain Age: 39 years Sex: Female Date of Admission: 05/29/2024 Legal Status: Involuntary Commitment (IVC) Admitting Service: Inpatient Psychiatry Source of Information: Patient interview with interpreter, collateral from 36 year old daughter and husband, chart review, OB and CSW notes Reliability: Limited due to minimization and poor insight  CHIEF COMPLAINT  Im fine. I can go home.  HISTORY OF PRESENT ILLNESS  [name] is a 39 year old postpartum female with a known history of schizophrenia, undifferentiated type, admitted following home delivery of an infant and subsequent psychiatric consultation due to psychotic decompensation, medication noncompliance, refusal of medical care, and impaired capacity.  Collateral history reveals that the patient discontinued psychiatric medications several years ago related to pregnancy and breastfeeding. Since approximately January 2025, she has demonstrated progressive psychotic symptoms, including:  Paranoid delusions regarding neighbors  Olfactory hallucinations (smelling blood and urine)  Disorganized and odd behaviors  Reversal of sleep-wake cycle  Increasing refusal of medical and psychiatric care  In July 2025, she was involuntarily hospitalized at [facility] and later transferred to [facility], at which time pregnancy was discovered. After discharge, she refused all medications and follow-up, despite family encouragement.  The night prior to admission, the patient went into labor at home, refused hospital evaluation, delivered at home, and subsequently presented to the hospital postpartum. Since admission, she has refused vital signs, laboratory testing, and fundal checks. Family requested involuntary commitment due to concern for worsening psychosis and inability to engage in care.  During evaluation, the patient minimizes symptoms, denies psychiatric illness, and  expresses desire to leave the hospital. Although she denies psychotic symptoms, extensive collateral confirms active paranoia, hallucinations, and impaired judgment. Given refusal of care and lack of insight, she meets criteria for inpatient psychiatric admission.  PAST PSYCHIATRIC HISTORY  Primary Diagnosis: Schizophrenia, undifferentiated  Prior Hospitalizations:  [facility] - July 2025 [facility])  Outpatient Treatment: [facility] Ur[facility] [phone]); none currently  Medication Trials: Haloperidol  (past)  Medication Adherence: Noncompliant for several years  Psychotherapy: Past, not ongoing  Suicide Attempts: None reported  MEDICAL / OBSTETRIC HISTORY  Recent home vaginal delivery, postpartum day 0  No prenatal care during pregnancy  Currently breastfeeding  CURRENT MEDICATIONS  None on admission (patient refusing medications prior to hospitalization)  ALLERGIES  No known drug allergies reported  SUBSTANCE USE HISTORY  Denies current or past use of alcohol, tobacco, or illicit substances  FAMILY HISTORY  Noncontributory for psychiatric illness per available information  SOCIAL HISTORY  Married, lives with husband and children  Immigrated from [place] in 2017  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  Recent psychosocial stressor: death of mother (summer 2024)  Strong family involvement and support  REVIEW OF SYSTEMS  Psychiatric: Denies depression, anxiety, mania, hallucinations, or delusions (contradicted by collateral)  All other systems: Reviewed and negative  MENTAL STATUS EXAMINATION  Appearance: Postpartum female, tired-appearing, holding infant appropriately  Behavior: Guarded, minimally cooperative  Speech: Normal rate and volume, reduced spontaneity  Mood: Fine  Affect: Constricted  Thought Process: Circumstantial and at times disorganized  Thought Content: Denies delusions; collateral confirms paranoia  Perception:  Denies AH/VH; collateral reports auditory and visual hallucinations  Cognition: Alert; orientation grossly intact  Insight: Poor  Judgment: Impaired  Impulse Control: Fair  Physical exam -   Heart and lungs normal Gait normal Neuro normal  SUICIDE & VIOLENCE RISK ASSESSMENT  Suicidal Ideation: Denied  Homicidal Ideation: Denied  Past Attempts: None  Risk Factors:  Chronic psychotic disorder  Medication noncompliance  Postpartum period  Poor insight and judgment  Refusal of medical care  Protective Factors:  Family support  Newborn child  Overall Suicide Risk: Low acute, moderate chronic  CAPACITY ASSESSMENT  The patient lacks capacity to make informed decisions regarding psychiatric and medical care due to impaired insight, minimization of symptoms, and refusal of necessary treatment.  DIAGNOSIS (DSM-5)  Schizophrenia, undifferentiated type - acute exacerbation  Medication noncompliance  Postpartum state with high psychiatric risk  MEDICAL NECESSITY STATEMENT  Inpatient psychiatric hospitalization is medically necessary due to active psychosis, impaired decision-making capacity, refusal of essential postpartum medical care, and high risk for further decompensation without supervised treatment.  TREATMENT PLAN  Admit to inpatient psychiatry under involuntary commitment  Initiate Olanzapine  (Zyprexa ) for psychosis, selected for efficacy and relative safety in breastfeeding  Monitor for medication side effects, sedation, and postpartum psychiatric complications  Coordinate care with OB and pediatrics regarding breastfeeding and maternal health  Encourage gradual compliance with medical monitoring (vitals, labs)  Maintain close family and collateral communication  Social work and case management for discharge planning and outpatient psychiatric follow-up  DISPOSITION  Continue inpatient psychiatric treatment for stabilization. Discharge  planning to include medication adherence plan, outpatient psychiatric follow-up, and family involvement once clinically appropriate.  Millie Manners M.D 1.1.2026 11.44am "

## 2024-06-01 DIAGNOSIS — Z91148 Patient's other noncompliance with medication regimen for other reason: Secondary | ICD-10-CM | POA: Diagnosis not present

## 2024-06-01 DIAGNOSIS — F203 Undifferentiated schizophrenia: Secondary | ICD-10-CM | POA: Diagnosis not present

## 2024-06-01 LAB — GC/CHLAMYDIA PROBE AMP (~~LOC~~) NOT AT ARMC
Chlamydia: NEGATIVE
Comment: NEGATIVE
Comment: NORMAL
Neisseria Gonorrhea: NEGATIVE

## 2024-06-01 NOTE — Progress Notes (Addendum)
 Pt spent the entire day sitting in a chair outside her room, she refused to go into her room or get in bed, There is something in there that scares me and there is a smell. I will move when my family comes to see me Pt have refused all her scheduled medications, denies self harm thoughts or AVH.      06/01/24 1300  Psych Admission Type (Psych Patients Only)  Admission Status Involuntary  Psychosocial Assessment  Patient Complaints None  Eye Contact Brief  Facial Expression Flat  Affect Anxious  Speech Logical/coherent  Interaction Cautious  Motor Activity Restless  Appearance/Hygiene In scrubs  Behavior Characteristics Anxious  Mood Anxious;Sad  Thought Process  Coherency Circumstantial  Content Preoccupation  Delusions Paranoid  Perception WDL  Hallucination None reported or observed  Judgment Poor  Confusion None  Danger to Self  Current suicidal ideation? Denies  Agreement Not to Harm Self Yes  Description of Agreement verbal  Danger to Others  Danger to Others None reported or observed

## 2024-06-01 NOTE — Group Note (Signed)
 Date:  06/01/2024 Time:  9:40 PM  Group Topic/Focus:  Coping With Mental Health Crisis:   The purpose of this group is to help patients identify strategies for coping with mental health crisis.  Group discusses possible causes of crisis and ways to manage them effectively. Managing Feelings:   The focus of this group is to identify what feelings patients have difficulty handling and develop a plan to handle them in a healthier way upon discharge.    Pt did not attend group.  Amy Mcclain L 06/01/2024, 9:40 PM

## 2024-06-01 NOTE — BH Specialist Note (Unsigned)
"     Integrated Behavioral Health via Telemedicine Visit  06/01/2024 Amy Mcclain [id number]  Number of Integrated Behavioral Health Clinician visits: No data recorded Session Start time: No data recorded  Session End time: No data recorded Total time in minutes: No data recorded   Referring Provider: *** Patient/Family location: *** [facility] Provider location: *** All persons participating in visit: *** Types of Service: {CHL AMB TYPE OF [phone]}  I connected with Amy Mcclain and/or Amy Mcclain's {family members:20773} via  Telephone or Video Enabled Telemedicine Application  (Video is Caregility application) and verified that I am speaking with the correct person using two identifiers. Discussed confidentiality: {YES/NO:21197}  I discussed the limitations of telemedicine and the availability of in person appointments.  Discussed there is a possibility of technology failure and discussed alternative modes of communication if that failure occurs.  I discussed that engaging in this telemedicine visit, they consent to the provision of behavioral healthcare and the services will be billed under their insurance.  Patient and/or legal guardian expressed understanding and consented to Telemedicine visit: {YES/NO:21197}  Presenting Concerns: Patient and/or family reports the following symptoms/concerns: *** Duration of problem: ***; Severity of problem: {Mild/Moderate/Severe:20260}  Patient and/or Family's Strengths/Protective Factors: {CHL AMB BH PROTECTIVE FACTORS:[phone]}  Goals Addressed: Patient will:  Reduce symptoms of: {IBH Symptoms:21014056}   Increase knowledge and/or ability of: {IBH Patient Tools:21014057}   Demonstrate ability to: {IBH Goals:21014053}  Progress towards Goals: {CHL AMB BH PROGRESS TOWARDS GOALS:[phone]}    Interventions: Interventions utilized:  {IBH Interventions:21014054} Standardized Assessments completed: {IBH Screening  Tools:21014051}    Patient and/or Family Response: ***  Clinical Assessment/Diagnosis  No diagnosis found.    Assessment: Patient currently experiencing ***.   Patient may benefit from ***.  Plan: Follow up with behavioral health clinician on : *** Behavioral recommendations: *** Referral(s): {IBH Referrals:21014055}  I discussed the assessment and treatment plan with the patient and/or parent/guardian. They were provided an opportunity to ask questions and all were answered. They agreed with the plan and demonstrated an understanding of the instructions.   They were advised to call back or seek an in-person evaluation if the symptoms worsen or if the condition fails to improve as anticipated.  Natalyia Innes C Astin Rape, LCSW "

## 2024-06-01 NOTE — BH IP Treatment Plan (Signed)
 Interdisciplinary Treatment and Diagnostic Plan Update  06/01/2024 Time of Session: 10:00AM Amy Mcclain MRN: [id number]  Principal Diagnosis: Schizophrenia, undifferentiated (HCC)  Secondary Diagnoses: Principal Problem:   Schizophrenia, undifferentiated (HCC)   Current Medications:  Current Facility-Administered Medications  Medication Dose Route Frequency Provider Last Rate Last Admin   acetaminophen  (TYLENOL ) tablet 650 mg  650 mg Oral Q4H PRN Lord, Jamison Y, NP       alum & mag hydroxide-simeth (MAALOX/MYLANTA) 200-200-20 MG/5ML suspension 30 mL  30 mL Oral Q4H PRN Lord, Jamison Y, NP       benzocaine -Menthol  (DERMOPLAST) 20-0.5 % topical spray 1 Application  1 Application Topical PRN Lord, Jamison Y, NP       coconut oil  1 Application Topical PRN Jacquetta Sharlot GRADE, NP       witch hazel-glycerin  (TUCKS) pad 1 Application  1 Application Topical PRN Jacquetta Sharlot GRADE, NP       And   dibucaine (NUPERCAINAL) 1 % rectal ointment 1 Application  1 Application Rectal PRN Jacquetta Sharlot GRADE, NP       diphenhydrAMINE  (BENADRYL ) capsule 25 mg  25 mg Oral Q6H PRN Lord, Jamison Y, NP       ferrous sulfate tablet 325 mg  325 mg Oral Q1400 Jacquetta Sharlot GRADE, NP       ibuprofen  (ADVIL ) tablet 600 mg  600 mg Oral Q6H Lord, Sharlot GRADE, NP       magnesium  hydroxide (MILK OF MAGNESIA) suspension 30 mL  30 mL Oral Daily PRN Lord, Jamison Y, NP       OLANZapine  (ZYPREXA ) tablet 5 mg  5 mg Oral QHS Jacquetta Sharlot GRADE, NP       Or   OLANZapine  (ZYPREXA ) injection 5 mg  5 mg Intramuscular QHS Jacquetta Sharlot GRADE, NP       OLANZapine  (ZYPREXA ) injection 5 mg  5 mg Intramuscular TID PRN Jacquetta Sharlot GRADE, NP       prenatal multivitamin tablet 1 tablet  1 tablet Oral Q1200 Jacquetta Sharlot GRADE, NP       senna-docusate (Senokot-S) tablet 2 tablet  2 tablet Oral Daily Lord, Sharlot GRADE, NP       simethicone  (MYLICON) chewable tablet 80 mg  80 mg Oral PRN Lord, Jamison Y, NP       zolpidem  (AMBIEN ) tablet 5 mg  5 mg Oral QHS PRN Jacquetta Sharlot GRADE, NP       PTA Medications: Medications Prior to Admission  Medication Sig Dispense Refill Last Dose/Taking   ibuprofen  (ADVIL ) 600 MG tablet Take 1 tablet (600 mg total) by mouth every 6 (six) hours. 30 tablet 0 Unknown   OLANZapine  (ZYPREXA ) 5 MG tablet Take 1 tablet (5 mg total) by mouth at bedtime. 30 tablet 1 Taking   Prenatal Vit-Fe Fumarate-FA (PRENATAL MULTIVITAMIN) TABS tablet Take 1 tablet by mouth daily at 12 noon. 30 tablet 5 Unknown   lurasidone (LATUDA) 20 MG TABS tablet Take 20 mg by mouth at bedtime. (Patient not taking: Reported on 05/31/2024)   Not Taking    Patient Stressors:    Patient Strengths:    Treatment Modalities: Medication Management, Group therapy, Case management,  1 to 1 session with clinician, Psychoeducation, Recreational therapy.   Physician Treatment Plan for Primary Diagnosis: Schizophrenia, undifferentiated (HCC) Long Term Goal(s):     Short Term Goals:    Medication Management: Evaluate patient's response, side effects, and tolerance of medication regimen.  Therapeutic Interventions: 1 to 1 sessions, Unit Group sessions and  Medication administration.  Evaluation of Outcomes: Not Met  Physician Treatment Plan for Secondary Diagnosis: Principal Problem:   Schizophrenia, undifferentiated (HCC)  Long Term Goal(s):     Short Term Goals:       Medication Management: Evaluate patient's response, side effects, and tolerance of medication regimen.  Therapeutic Interventions: 1 to 1 sessions, Unit Group sessions and Medication administration.  Evaluation of Outcomes: Not Met   RN Treatment Plan for Primary Diagnosis: Schizophrenia, undifferentiated (HCC) Long Term Goal(s): Knowledge of disease and therapeutic regimen to maintain health will improve  Short Term Goals: Ability to verbalize frustration and anger appropriately will improve, Ability to demonstrate self-control, Ability to participate in decision making will improve,  Ability to verbalize feelings will improve, Ability to disclose and discuss suicidal ideas, Ability to identify and develop effective coping behaviors will improve, and Compliance with prescribed medications will improve  Medication Management: RN will administer medications as ordered by provider, will assess and evaluate patient's response and provide education to patient for prescribed medication. RN will report any adverse and/or side effects to prescribing provider.  Therapeutic Interventions: 1 on 1 counseling sessions, Psychoeducation, Medication administration, Evaluate responses to treatment, Monitor vital signs and CBGs as ordered, Perform/monitor CIWA, COWS, AIMS and Fall Risk screenings as ordered, Perform wound care treatments as ordered.  Evaluation of Outcomes: Not Met   LCSW Treatment Plan for Primary Diagnosis: Schizophrenia, undifferentiated (HCC) Long Term Goal(s): Safe transition to appropriate next level of care at discharge, Engage patient in therapeutic group addressing interpersonal concerns.  Short Term Goals: Engage patient in aftercare planning with referrals and resources, Increase social support, Increase ability to appropriately verbalize feelings, Increase emotional regulation, Facilitate acceptance of mental health diagnosis and concerns, and Increase skills for wellness and recovery  Therapeutic Interventions: Assess for all discharge needs, 1 to 1 time with Social worker, Explore available resources and support systems, Assess for adequacy in community support network, Educate family and significant other(s) on suicide prevention, Complete Psychosocial Assessment, Interpersonal group therapy.  Evaluation of Outcomes: Not Met   Progress in Treatment: Attending groups: No. Participating in groups: No. Taking medication as prescribed: Yes. Toleration medication: Yes. Family/Significant other contact made: No, will contact:  once permission has been provided.   Patient understands diagnosis: No. Discussing patient identified problems/goals with staff: Yes. Medical problems stabilized or resolved: Yes. Denies suicidal/homicidal ideation: Yes. Issues/concerns per patient self-inventory: No. Other: none  New problem(s) identified: No, Describe:  none  New Short Term/Long Term Goal(s): detox, elimination of symptoms of psychosis, medication management for mood stabilization; elimination of SI thoughts; development of comprehensive mental wellness/sobriety plan.   Patient Goals:  No goals.  I just had a baby.  Discharge Plan or Barriers: CSW to assist with development of appropriate discharge plans.   Reason for Continuation of Hospitalization: Aggression Anxiety Depression Medication stabilization Suicidal ideation  Estimated Length of Stay: 1-7 days  Last 3 [place] Suicide Severity Risk Score: Flowsheet Row Admission (Current) from 05/31/2024 in [facility] ED from [facility]/29/2023 in [facility] Department at [facility] Clinical Support from 01/27/2023 in [facility]  C-SSRS RISK CATEGORY No Risk No Risk No Risk    Last PHQ 2/9 Scores:    01/27/2023    4:36 PM 11/17/2022    3:18 PM 08/27/2022    3:43 PM  Depression screen PHQ 2/9  Decreased Interest 0 0 1  Down, Depressed, Hopeless 0 0 0  PHQ - 2 Score 0  0 1    Scribe for Treatment Team: Sherryle JINNY Margo, KEN 06/01/2024 2:22 PM

## 2024-06-01 NOTE — BHH Counselor (Signed)
 Patient declining referral for aftercare treatment at this time.   Sherryle Margo, MSW, LCSW 06/01/2024 12:32 PM

## 2024-06-01 NOTE — Group Note (Signed)
 Recreation Therapy Group Note   Group Topic:Leisure Education  Group Date: 06/01/2024 Start Time: 1020 End Time: 1115 Facilitators: Celestia Jeoffrey BRAVO, LRT, CTRS Location: Craft Room  Group Description: Leisure. Patients were given the option to choose from journaling, coloring, drawing, making origami, playing with playdoh, listening to music or singing karaoke. LRT and pts discussed the meaning of leisure, the importance of participating in leisure during their free time/when they're outside of the hospital, as well as how our leisure interests can also serve as coping skills.   Goal Area(s) Addressed:  Patient will identify a current leisure interest.  Patient will learn the definition of leisure. Patient will practice making a positive decision. Patient will have the opportunity to try a new leisure activity. Patient will communicate with peers and LRT.    Affect/Mood: N/A   Participation Level: Did not attend    Clinical Observations/Individualized Feedback: Patient did not attend.  Plan: Continue to engage patient in RT group sessions 2-3x/week.   Jeoffrey BRAVO Celestia, LRT, CTRS 06/01/2024 11:32 AM

## 2024-06-01 NOTE — Plan of Care (Signed)
  Problem: Activity: Goal: Sleeping patterns will improve Outcome: Not Progressing

## 2024-06-01 NOTE — Group Note (Signed)
 Date:  06/01/2024 Time:  10:08 AM  Group Topic/Focus:  Goals Group:   The focus of this group is to help patients establish daily goals to achieve during treatment and discuss how the patient can incorporate goal setting into their daily lives to aide in recovery.    Participation Level:  Did Not Attend   Amy Mcclain June 06/01/2024, 10:08 AM

## 2024-06-01 NOTE — Group Note (Signed)
 Recreation Therapy Group Note   Group Topic:Stress Management  Group Date: 06/01/2024 Start Time: 1530 End Time: 1630 Facilitators: Celestia Jeoffrey BRAVO, LRT, CTRS Location: Dayroom  Group Description: Rummy. LRT and pts played games of Rummy. LRT prompted group discussion on the physical signs and symptoms of stress, like those you feel when playing a competitive card game. LRT and pt discussed the physical and mental signs of stress, as well as coping skills to manage them.   Goal Area(s) Addressed: Patient will identify physical symptoms of stress. Patient will identify coping skills for stress. Patient will build frustration tolerance skills.  Patient will increase communication.    Affect/Mood: N/A   Participation Level: Did not attend    Clinical Observations/Individualized Feedback: Patient did not attend.  Plan: Continue to engage patient in RT group sessions 2-3x/week.   Jeoffrey BRAVO Celestia, LRT, CTRS 06/01/2024 5:16 PM

## 2024-06-01 NOTE — Group Note (Signed)
 Date:  06/01/2024 Time:  6:56 PM  Group Topic/Focus:  Wellness Toolbox:   The focus of this group is to discuss various aspects of wellness, balancing those aspects and exploring ways to increase the ability to experience wellness.  Patients will create a wellness toolbox for use upon discharge.    Participation Level:  Active  Participation Quality:  Appropriate  Affect:  Appropriate  Cognitive:  Appropriate  Insight: Appropriate  Engagement in Group:  Engaged  Modes of Intervention:  Activity  Additional Comments:    [name] Maximino Cozzolino 06/01/2024, 6:56 PM

## 2024-06-01 NOTE — Plan of Care (Signed)
" °  Problem: Education: Goal: Knowledge of [place] [facility] Education information/materials will improve Outcome: Not Progressing Goal: Emotional status will improve Outcome: Not Progressing Goal: Mental status will improve Outcome: Not Progressing Goal: Verbalization of understanding the information provided will improve Outcome: Not Progressing   Problem: Activity: Goal: Interest or engagement in activities will improve Outcome: Not Progressing Goal: Sleeping patterns will improve Outcome: Not Progressing   Problem: Safety: Goal: Ability to remain free from injury will improve Outcome: Not Progressing   "
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
## 2024-06-02 DIAGNOSIS — F29 Unspecified psychosis not due to a substance or known physiological condition: Secondary | ICD-10-CM

## 2024-06-02 MED ORDER — OLANZAPINE 5 MG PO TABS: 7.5000 mg | ORAL_TABLET | Freq: Every day | ORAL | Status: DC

## 2024-06-02 MED ORDER — OLANZAPINE 5 MG PO TABS
5.0000 mg | ORAL_TABLET | Freq: Every day | ORAL | Status: DC
  Administered 2024-06-03: 5 mg via ORAL
  Filled 2024-06-02: qty 1

## 2024-06-02 MED ORDER — OLANZAPINE 10 MG IM SOLR: 5.0000 mg | Freq: Every day | INTRAMUSCULAR | Status: DC

## 2024-06-02 MED ORDER — OLANZAPINE 10 MG IM SOLR
5.0000 mg | Freq: Every day | INTRAMUSCULAR | Status: DC
  Administered 2024-06-02: 5 mg via INTRAMUSCULAR

## 2024-06-02 NOTE — Progress Notes (Signed)
" °   06/02/24 0057  Psych Admission Type (Psych Patients Only)  Admission Status Involuntary  Psychosocial Assessment  Patient Complaints Loneliness  Eye Contact Brief  Facial Expression Flat  Affect Anxious  Speech Logical/coherent  Interaction Cautious  Motor Activity Restless  Appearance/Hygiene Unremarkable  Behavior Characteristics Anxious  Mood Anxious  Thought Process  Coherency Disorganized  Content Preoccupation  Delusions Paranoid  Perception Derealization  Hallucination None reported or observed  Judgment Poor  Confusion Mild   Patient alert and oriented x 3, affect is flat forwards very little,her thoughts are disorganized  and speech is tangential, communication was done via interpreter machine. Patient endorsed restlessness and anxiety; she appears worried about her husband and her newborn. Patient requested she see her baby otherwise she's not lying down on her bed. Patient is noted on the hallway beside her door way sitting in a chair.  Patient was offered emotional support, 15 minutes safety checks done will continue to monitor.  "

## 2024-06-02 NOTE — Progress Notes (Signed)
 [facility] MD Progress Note  06/02/2024 9:31 PM Amy Mcclain  MRN:  [id number]   Subjective:  Chart reviewed, case discussed in multidisciplinary meeting, patient seen during rounds.   1/3: Patient seen in the presence of an interpreter.   Initially, she reported that she is done with the doctors. She then was OK speaking to clinical research associate. She reports she has taken all of the medication that she needs and needs no more. She reports that the doctor told her she does not have to take medication. She is unable to identify which doctor this was.    Although being told several times that patient would be staying in the hospital she continue to state that she is being discharged with her husband. At one point stating that she has already been discharged. She denies SI, HI, and AVH. Her thought process continues to contain delusions and is disorganized.   When discussing potential for forced medication, patient did not comprehend the information that was being told to her. Attempted to discuss taking oral medication and patient continue to decline. She continues to state that she does not need to see psychiatry.   MD spoke with family. Family reported that patient has been off of her medication for some time. They report she does much better when she is on medication and that this is not her baseline. They would like her to get restarted back on medication. Please review MD note discussion forced medication and family discussion. Family confirmed to nursing that patient is not breastfeeding and that the infant is taking formula.   Past Psychiatric History: see h&P Family History:  Family History  Problem Relation Age of Onset   Hypertension Mother    Diabetes Mother    Diabetes Father    Hypertension Father    Social History:  Social History   Substance and Sexual Activity  Alcohol Use No   Alcohol/week: 0.0 standard drinks of alcohol     Social History   Substance and Sexual Activity  Drug Use No     Social History   Socioeconomic History   Marital status: Married    Spouse name: Not on file   Number of children: Not on file   Years of education: Not on file   Highest education level: Not on file  Occupational History   Not on file  Tobacco Use   Smoking status: Never   Smokeless tobacco: Never  Vaping Use   Vaping status: Never Used  Substance and Sexual Activity   Alcohol use: No    Alcohol/week: 0.0 standard drinks of alcohol   Drug use: No   Sexual activity: Yes  Other Topics Concern   Not on file  Social History Narrative   Not on file   Social Drivers of Health   Tobacco Use: Low Risk (05/31/2024)   Patient History    Smoking Tobacco Use: Never    Smokeless Tobacco Use: Never    Passive Exposure: Not on file  Financial Resource Strain: Not on file  Food Insecurity: No Food Insecurity (05/31/2024)   Epic    Worried About Programme Researcher, Broadcasting/film/video in the Last Year: Never true    Ran Out of Food in the Last Year: Never true  Transportation Needs: No Transportation Needs (05/31/2024)   Epic    Lack of Transportation (Medical): No    Lack of Transportation (Non-Medical): No  Physical Activity: Not on file  Stress: Not on file  Social Connections: Not on file  Depression (  PHQ2-9): Low Risk (01/27/2023)   Depression (PHQ2-9)    PHQ-2 Score: 0  Alcohol Screen: Low Risk (05/31/2024)   Alcohol Screen    Last Alcohol Screening Score (AUDIT): 0  Housing: Low Risk (05/31/2024)   Epic    Unable to Pay for Housing in the Last Year: No    Number of Times Moved in the Last Year: 0    Homeless in the Last Year: No  Utilities: Not At Risk (05/31/2024)   Epic    Threatened with loss of utilities: No  Health Literacy: Not on file   Past Medical History:  Past Medical History:  Diagnosis Date   Anemia    Depression    Schizophrenia (HCC)     Past Surgical History:  Procedure Laterality Date   NO PAST SURGERIES      Current Medications: Current Facility-Administered  Medications  Medication Dose Route Frequency Provider Last Rate Last Admin   acetaminophen  (TYLENOL ) tablet 650 mg  650 mg Oral Q4H PRN Jacquetta Sharlot GRADE, NP       alum & mag hydroxide-simeth (MAALOX/MYLANTA) 200-200-20 MG/5ML suspension 30 mL  30 mL Oral Q4H PRN Jacquetta Sharlot GRADE, NP       benzocaine -Menthol  (DERMOPLAST) 20-0.5 % topical spray 1 Application  1 Application Topical PRN Lord, Jamison Y, NP       coconut oil  1 Application Topical PRN Jacquetta Sharlot GRADE, NP       witch hazel-glycerin  (TUCKS) pad 1 Application  1 Application Topical PRN Jacquetta Sharlot GRADE, NP       And   dibucaine (NUPERCAINAL) 1 % rectal ointment 1 Application  1 Application Rectal PRN Jacquetta Sharlot GRADE, NP       diphenhydrAMINE  (BENADRYL ) capsule 25 mg  25 mg Oral Q6H PRN Jacquetta Sharlot GRADE, NP       ferrous sulfate  tablet 325 mg  325 mg Oral Q1400 Jacquetta Sharlot GRADE, NP       ibuprofen  (ADVIL ) tablet 600 mg  600 mg Oral Q6H Lord, Sharlot GRADE, NP       magnesium  hydroxide (MILK OF MAGNESIA) suspension 30 mL  30 mL Oral Daily PRN Jacquetta Sharlot GRADE, NP       OLANZapine  (ZYPREXA ) injection 5 mg  5 mg Intramuscular TID PRN Jacquetta Sharlot GRADE, NP       OLANZapine  (ZYPREXA ) tablet 5 mg  5 mg Oral QHS Madaram, Kondal R, MD       Or   OLANZapine  (ZYPREXA ) injection 5 mg  5 mg Intramuscular QHS Madaram, Kondal R, MD   5 mg at 06/02/24 2126   prenatal multivitamin tablet 1 tablet  1 tablet Oral Q1200 Jacquetta Sharlot GRADE, NP       senna-docusate (Senokot-S) tablet 2 tablet  2 tablet Oral Daily Jacquetta Sharlot GRADE, NP       simethicone  (MYLICON) chewable tablet 80 mg  80 mg Oral PRN Lord, Jamison Y, NP       zolpidem  (AMBIEN ) tablet 5 mg  5 mg Oral QHS PRN Jacquetta Sharlot GRADE, NP        Lab Results: No results found for this or any previous visit (from the past 48 hours).  Blood Alcohol level:  Lab Results  Component Value Date   [facility] <15 11/29/2023   ETH <10 10/11/2017    Metabolic Disorder Labs: Lab Results  Component Value Date   HGBA1C 5.0  05/29/2024   MPG 96.8 05/29/2024   MPG 91.06 10/18/2017   Lab Results  Component Value Date   PROLACTIN 166.7 (H) 10/18/2017   Lab Results  Component Value Date   CHOL 311 (H) 05/29/2024   TRIG 196 (H) 05/29/2024   HDL 55 05/29/2024   CHOLHDL 5.7 05/29/2024   VLDL 39 05/29/2024   LDLCALC 217 (H) 05/29/2024   LDLCALC 102 (H) 10/18/2017    Physical Findings: AIMS:  , ,  ,  ,    CIWA:    COWS:      Psychiatric Specialty Exam:  Presentation  General Appearance:  Appropriate for Environment; Casual  Eye Contact: Minimal  Speech: Clear and Coherent  Speech Volume: Normal    Mood and Affect  Mood: Irritable  Affect: Blunt   Thought Process  Thought Processes: Disorganized  Orientation:Full (Time, Place and Person)  Thought Content:Illogical; Delusions  Hallucinations:Hallucinations: None  Ideas of Reference:Delusions  Suicidal Thoughts:Suicidal Thoughts: No  Homicidal Thoughts:Homicidal Thoughts: No   Sensorium  Memory: Immediate Poor  Judgment: Impaired  Insight: Lacking   Executive Functions  Concentration: Fair  Attention Span: Fair  Recall: Poor  Fund of Knowledge: Fair  Language: Fair   Psychomotor Activity  Psychomotor Activity: Psychomotor Activity: Normal  Musculoskeletal: Strength & Muscle Tone: within normal limits Gait & Station: normal Assets  Assets: Housing; Social Support    Physical Exam: Physical Exam Vitals and nursing note reviewed.  Constitutional:      Appearance: Normal appearance.  Pulmonary:     Effort: Pulmonary effort is normal.  Neurological:     Mental Status: She is alert.    Review of Systems  Respiratory:  Negative for shortness of breath.   Cardiovascular:  Negative for chest pain.  Gastrointestinal:  Negative for diarrhea, nausea and vomiting.  Psychiatric/Behavioral:  Negative for substance abuse and suicidal ideas.   All other systems reviewed and are  negative.  Blood pressure 112/72, pulse 95, temperature 98 F (36.7 C), temperature source Oral, resp. rate 15, last menstrual period 08/04/2023, SpO2 100%, unknown if currently breastfeeding. There is no height or weight on file to calculate BMI.  Diagnosis: Principal Problem:   Schizophrenia, undifferentiated (HCC)   PLAN: Safety and Monitoring:  -- Involuntary admission to inpatient psychiatric unit for safety, stabilization and treatment  -- Daily contact with patient to assess and evaluate symptoms and progress in treatment  -- Patient's case to be discussed in multi-disciplinary team meeting  -- Observation Level : q15 minute checks  -- Vital signs:  q12 hours  -- Precautions: suicide, elopement, and assault -- Encouraged patient to participate in unit milieu and in scheduled group therapies  2. Psychiatric Treatment:  Scheduled Medications: Zyprexa  PO or IM - Forced medication order as per Dr. Ruther  Ambien  PRN sleep    -- The risks/benefits/side-effects/alternatives to this medication were discussed in detail with the patient and time was given for questions. The patient consents to medication trial.  3. Medical Issues Being Addressed:  Post Partum - patient declining vitamins/medications No medical concerns expressed  4. Discharge Planning:   -- Social work and case management to assist with discharge planning and identification of hospital follow-up needs prior to discharge  -- Estimated LOS: 5-7 days  Tomeko Scoville, NP 06/02/2024, 9:31 PM

## 2024-06-02 NOTE — Progress Notes (Addendum)
 PSYCHIATRIC INPATIENT PROGRESS NOTE  Patient Name: Amy Mcclain Age: 39 year old female Date of Service: 06/01/2024 Hospital Day: [Inpatient] Primary Diagnosis: Schizophrenia, undifferentiated type Legal Status: Involuntary (per chart/collateral) Language: Arabic - evaluation conducted with certified medical interpreter  SUBJECTIVE  Patient was seen and examined on the inpatient unit with the assistance of interpreters. She minimizes symptoms and remains highly focused on discharge, repeatedly stating she wants to go home. She denies having a psychiatric illness and denies hallucinations or delusions. She does not acknowledge the need for medication or inpatient treatment.  She demonstrates limited engagement in treatment and poor participation in unit activities. She continues to express resistance to care but has not displayed overt agitation or aggression.  COLLATERAL / INTERVAL HISTORY  Extensive collateral and chart review continue to support ongoing psychotic symptoms, including paranoid beliefs, olfactory hallucinations, disorganized behavior, impaired judgment, and refusal of care. The patient has a documented history of medication noncompliance, including refusal of prescribed olanzapine  (Zyprexa ).  She remains postpartum and previously refused medical evaluations and labs. Insight remains poor.  MENTAL STATUS EXAM  Appearance: Postpartum female, guarded  Behavior: Minimally cooperative, discharge-focused  Speech: Normal rate and volume (via interpreter)  Mood: Fine per patient  Affect: Constricted  Thought Process: Circumstantial, disorganized at times  Thought Content:  Denies delusions; collateral confirms paranoia  Denies hallucinations; collateral confirms olfactory hallucinations  Perception: Poor insight into illness  Cognition: Grossly intact  Insight: Poor  Judgment: Poor  SUICIDE & SAFETY ASSESSMENT  Suicidal Ideation: Denied  Homicidal  Ideation: Denied  Overt Aggression: None observed  Risk Factors: Active psychosis, postpartum state, impaired judgment, medication noncompliance  Protective Factors: Inpatient setting, family involvement  Overall Risk: Low for imminent self-harm by report, but elevated safety risk due to untreated psychosis and impaired capacity. Self-report reliability is limited. Blood pressure 121/77, pulse 88, temperature 98 F (36.7 C), temperature source Oral, resp. rate 15, last menstrual period 08/04/2023, SpO2 100%, unknown if currently breastfeeding.  MEDICATION ADHERENCE  Olanzapine  (Zyprexa ): Prescribed; patient remains noncompliant  No reported side effects (medication largely not taken)  ASSESSMENT  Ms. Lim continues to demonstrate acute psychotic symptoms with poor insight, minimization of illness, and refusal of medication. Despite denying symptoms, collateral information confirms persistent paranoia, hallucinations, and impaired judgment. She lacks insight into her condition and remains unable to meaningfully engage in treatment decisions.  She continues to meet criteria for continued inpatient psychiatric hospitalization due to psychosis, medication noncompliance, and impaired capacity.  PLAN  Continue inpatient psychiatric treatment for stabilization.  Strongly recommend continuation of olanzapine  (Zyprexa ); monitor adherence.  Provide psychoeducation with interpreter support.  Maintain safety monitoring on the unit.  Coordinate care with OB/medical team as needed.  Continue collateral communication with family.

## 2024-06-02 NOTE — Plan of Care (Signed)
" °  °  Problem: Activity: Goal: Interest or engagement in activities will improve Outcome: Not Progressing   Problem: Activity: Goal: Sleeping patterns will improve Outcome: Not Progressing   Problem: Health Behavior/Discharge Planning: Goal: Identification of resources available to assist in meeting health care needs will improve Outcome: Not Progressing   "

## 2024-06-02 NOTE — Group Note (Signed)
 Date:  06/02/2024 Time:  2:04 PM  Group Topic/Focus:  Goals Group:   The focus of this group is to help patients establish daily goals to achieve during treatment and discuss how the patient can incorporate goal setting into their daily lives to aide in recovery.    Participation Level:  Did Not Attend   Amy Mcclain 06/02/2024, 2:04 PM

## 2024-06-02 NOTE — Plan of Care (Signed)
" °  Problem: Education: Goal: Emotional status will improve Outcome: Not Progressing   Problem: Education: Goal: Knowledge of [facility] Education information/materials will improve Outcome: Not Progressing  Patient is not responding to internal stimuli, not receptive to staff, denies SI/HI/AVH, affect is flat, thoughts are disorganized, speech is tangential.  "

## 2024-06-02 NOTE — Group Note (Signed)
 Date:  06/02/2024 Time:  10:06 PM  Group Topic/Focus:  Wrap-Up Group:   The focus of this group is to help patients review their daily goal of treatment and discuss progress on daily workbooks.  DIDN'T ATTEND    Kerri Katz 06/02/2024, 10:06 PM

## 2024-06-02 NOTE — Progress Notes (Signed)
 Pt interviewed with assistance of interpreter.  Pt denies SI HI AVH, states she does not know why she is here. Pt resistant to care, refusing medications.  Pt states she does not eat solid food only juices.  Pt says all she wants to do is leave and go be with her baby.  06/02/24 1000  Psych Admission Type (Psych Patients Only)  Admission Status Involuntary  Psychosocial Assessment  Patient Complaints Loneliness  Eye Contact Brief  Facial Expression Flat  Affect Anxious  Speech Logical/coherent  Interaction Guarded  Motor Activity Restless  Appearance/Hygiene Unremarkable  Behavior Characteristics Anxious  Mood Anxious  Thought Process  Coherency Disorganized  Content Preoccupation  Delusions Paranoid  Perception UTA  Hallucination None reported or observed  Judgment Limited  Confusion Mild  Danger to Self  Current suicidal ideation? Denies  Danger to Others  Danger to Others None reported or observed

## 2024-06-03 NOTE — Plan of Care (Signed)
" °  Problem: Education: Goal: Emotional status will improve Outcome: Progressing Goal: Mental status will improve Outcome: Not Progressing Goal: Verbalization of understanding the information provided will improve Outcome: Not Progressing   Problem: Activity: Goal: Interest or engagement in activities will improve Outcome: Progressing Goal: Sleeping patterns will improve Outcome: Progressing   Problem: Coping: Goal: Ability to verbalize frustrations and anger appropriately will improve Outcome: Progressing Goal: Ability to demonstrate self-control will improve Outcome: Progressing   Problem: Health Behavior/Discharge Planning: Goal: Compliance with treatment plan for underlying cause of condition will improve Outcome: Not Progressing   "

## 2024-06-03 NOTE — Plan of Care (Signed)
  Problem: Education: Goal: Knowledge of [facility] Education information/materials will improve Outcome: Progressing   Problem: Education: Goal: Emotional status will improve Outcome: Progressing   Problem: Education: Goal: Mental status will improve Outcome: Progressing   

## 2024-06-03 NOTE — Progress Notes (Addendum)
 Identifying Information  Adult female inpatient with severe psychotic disorder, currently hospitalized for stabilization of acute psychosis and inability to safely function in the community.  Interval History / Subjective  Patient was seen and evaluated today with the assistance of a professional interpreter. The patient remains severely delusional, stating that her house is possessed and expressing fixed persecutory and bizarre beliefs. She reports ongoing auditory hallucinations, endorsing hearing voices, and was observed to be internally preoccupied throughout the interview.  Patient continues to minimize the severity of her symptoms, denies the need for psychiatric treatment, and demonstrates poor insight and impaired judgment. She denies medication nonadherence but provides inconsistent information.  Collateral information was obtained from family today. Family reports that the patient has not been taking psychiatric medications at home, contributing to symptom decompensation. Since admission, the patient has refused prescribed olanzapine  (Zyprexa ) despite repeated education regarding risks, benefits, and alternatives.  Given persistent psychosis and refusal of treatment, discussion was initiated regarding enforced medication due to lack of capacity and risk of further deterioration.  Objective / Mental Status Examination  Appearance: Disheveled, appears stated age  Behavior: Guarded, intermittently uncooperative  Speech: Normal rate and volume via interpreter  Mood: Fine (subjective)  Affect: Constricted, incongruent  Thought Process: Disorganized, tangential  Thought Content:  Prominent fixed delusions (house possessed)  Auditory hallucinations  Internally preoccupied  Perception: Positive for auditory hallucinations  Insight: Poor  Judgment: Poor  Cognition: Impaired due to active psychosis  Suicidal Ideation: Denied  Homicidal Ideation: Development Worker, [employer] /  Suicide Risk Assessment  No active suicidal or homicidal ideation reported  However, patient exhibits severe psychosis, impaired reality testing, poor insight, and medication refusal, placing her at elevated risk for unintentional harm and inability to care for herself  Requires continued inpatient level of care  Assessment  Patient continues to demonstrate severe psychotic symptoms, including persistent delusions, auditory hallucinations, and internal preoccupation, in the context of medication nonadherence and poor insight. She is unable to engage meaningfully in treatment, continues to refuse antipsychotic medication, and lacks capacity to make informed decisions regarding care.  Diagnoses  Primary: Schizophrenia spectrum or other psychotic disorder (acute exacerbation)  Contributing Factors: Medication nonadherence, poor insight, limited capacity  Plan  Psychosis Management  Continue to offer antipsychotic medication (olanzapine ) with ongoing education  Patient continues to refuse medication  Medication Capacity / Enforced Treatment  Given persistent refusal, lack of capacity, and severity of illness, initiate process for enforced medication per hospital policy and legal guidelines  Safety  Continue close inpatient monitoring  Maintain structured milieu and staff support  Collateral & Coordination  Continue communication with family regarding treatment plan and clinical status  Document collateral information clearly in chart  Disposition  Patient not psychiatrically stable for discharge  Continue inpatient hospitalization due to active psychosis, impaired judgment, and inability to safely function outside the hospital  Medical Necessity Statement  Continued inpatient psychiatric hospitalization is medically necessary due to severe ongoing psychosis, medication refusal, lack of insight, impaired capacity, and risk of further clinical deterioration without a  controlled treatment environment.  Signature: Derhonda Eastlick Madaran 1.4.2026

## 2024-06-03 NOTE — Progress Notes (Signed)
" °   06/03/24 0931  Psych Admission Type (Psych Patients Only)  Admission Status Involuntary  Psychosocial Assessment  Patient Complaints None  Eye Contact Fair  Facial Expression Flat  Affect Preoccupied  Speech Logical/coherent  Interaction Guarded  Motor Activity Restless  Appearance/Hygiene Unremarkable  Behavior Characteristics Resistant to care  Mood Preoccupied  Aggressive Behavior  Effect No apparent injury  Thought Process  Coherency Circumstantial  Content Preoccupation;Blaming others  Delusions Persecutory  Perception Derealization  Hallucination None reported or observed  Judgment Impaired  Confusion Mild  Danger to Self  Current suicidal ideation? Denies  Agreement Not to Harm Self Yes  Description of Agreement verbal  Danger to Others  Danger to Others None reported or observed    "

## 2024-06-03 NOTE — Progress Notes (Signed)
" °   06/02/24 2000  Psych Admission Type (Psych Patients Only)  Admission Status Involuntary  Psychosocial Assessment  Patient Complaints Tension  Eye Contact Brief  Facial Expression Flat  Affect Anxious  Speech Argumentative;Tangential  Interaction Cautious  Motor Activity Restless  Appearance/Hygiene Unremarkable  Behavior Characteristics Unwilling to participate  Mood Suspicious;Preoccupied  Thought Process  Coherency Disorganized  Content Preoccupation  Delusions Paranoid  Perception Derealization  Hallucination None reported or observed  Judgment Poor  Confusion Mild  Danger to Self  Current suicidal ideation? Denies   Patient is alert and oriented to person, place and situation,she appears responding to internal stimuli, restless and not sleeping in her room, she stays up at night in a chair near the nurses station. Writer communicated with patient with use of the interpreter, the patient was not receptive, she was agitated and walked out of the conversation. Writer gave the patient the force medication as ordered. No distress noted, patient was noted sleeping in her room later, 15 minutes safety checks maintained.  "

## 2024-06-03 NOTE — Group Note (Signed)
 Date:  06/03/2024 Time:  9:54 PM   Additional Comments:  Did not attend group.  Amy Mcclain 06/03/2024, 9:54 PM

## 2024-06-03 NOTE — Progress Notes (Addendum)
" °   06/03/24 2100  Psych Admission Type (Psych Patients Only)  Admission Status Involuntary  Psychosocial Assessment  Patient Complaints None  Eye Contact Brief  Facial Expression Flat  Affect Anxious  Speech Logical/coherent;Soft;Slow  Interaction Cautious  Motor Activity Restless  Appearance/Hygiene Unremarkable  Behavior Characteristics Cooperative  Mood Pleasant  Thought Process  Coherency Disorganized  Content Preoccupation  Delusions Paranoid  Perception Derealization  Hallucination None reported or observed  Judgment Poor  Confusion Mild  Danger to Self  Current suicidal ideation? Denies  Agreement Not to Harm Self Yes  Description of Agreement verbal   Patient is alert and oriented x 4, affect is flat but brightens upon approach, she denies SI/HI/AVH interacting appropriately with staff, she appears more receptive to staff, cooperative with her medication regimen, thoughts less disorganized and she was noted lying in her bed with her closed, no distress noted. 15 minutes safety checks maintained will continue to monitor.  "

## 2024-06-03 NOTE — Progress Notes (Signed)
 [facility] MD Progress Note  06/03/2024 4:19 PM Amy Mcclain  MRN:  [id number]   Subjective:  Chart reviewed, case discussed in multidisciplinary meeting, patient seen during rounds.   1/4: Patient seen in the presence of an interpreter. She reports that she is feeling normal and that she doesn't want to take medication. She reports the medication given last night made her sleep too long. When asked how long she slept, she reports she slept from 9 PM to 3 AM. She then reports that her normal sleeping pattern is like a normal person  from around 9 PM to 8  AM. Discussed that she has the option of taking oral medication as opposed to the shot if she felt like the shot made her sleep too long (Although it was brought to her attention that sleeping 9 PM to 3 AM was six hours). She then reported to writer that the injection last night was enough and she doesn't need anymore. She would like to go home and she can come back and follow up with psychiatry if needed in the future. Discussed that that is not currently an option as she is under involuntary commitment and she reported that doctors told her she was fine and was no longer required to stay (Of which is not accurate). She denies SI, HI, AVH. She also denies ever having any psychiatric diagnoses. She then ends the assessment and gets on the phone to call her husband. She was not observed to be upset on the phone with her husband, but was able to return to her room and self regulate.  Patient continues to show delusional thinking. Her mood is irritable and her affect is blunt. She continues to deny medications and deny a history of a psychiatric disorder. Due to ongoing symptoms of psychosis, the patient continues to meet criteria for involuntary commitment.  1/3: Patient seen in the presence of an interpreter.   Initially, she reported that she is done with the doctors. She then was OK speaking to clinical research associate. She reports she has taken all of the medication that  she needs and needs no more. She reports that the doctor told her she does not have to take medication. She is unable to identify which doctor this was.    Although being told several times that patient would be staying in the hospital she continue to state that she is being discharged with her husband. At one point stating that she has already been discharged. She denies SI, HI, and AVH. Her thought process continues to contain delusions and is disorganized.   When discussing potential for forced medication, patient did not comprehend the information that was being told to her. Attempted to discuss taking oral medication and patient continue to decline. She continues to state that she does not need to see psychiatry.   MD spoke with family. Family reported that patient has been off of her medication for some time. They report she does much better when she is on medication and that this is not her baseline. They would like her to get restarted back on medication. Please review MD note discussion forced medication and family discussion. Family confirmed to nursing that patient is not breastfeeding and that the infant is taking formula.   Past Psychiatric History: see h&P Family History:  Family History  Problem Relation Age of Onset   Hypertension Mother    Diabetes Mother    Diabetes Father    Hypertension Father    Social History:  Social  History   Substance and Sexual Activity  Alcohol Use No   Alcohol/week: 0.0 standard drinks of alcohol     Social History   Substance and Sexual Activity  Drug Use No    Social History   Socioeconomic History   Marital status: Married    Spouse name: Not on file   Number of children: Not on file   Years of education: Not on file   Highest education level: Not on file  Occupational History   Not on file  Tobacco Use   Smoking status: Never   Smokeless tobacco: Never  Vaping Use   Vaping status: Never Used  Substance and Sexual Activity    Alcohol use: No    Alcohol/week: 0.0 standard drinks of alcohol   Drug use: No   Sexual activity: Yes  Other Topics Concern   Not on file  Social History Narrative   Not on file   Social Drivers of Health   Tobacco Use: Low Risk (05/31/2024)   Patient History    Smoking Tobacco Use: Never    Smokeless Tobacco Use: Never    Passive Exposure: Not on file  Financial Resource Strain: Not on file  Food Insecurity: No Food Insecurity (05/31/2024)   Epic    Worried About Programme Researcher, Broadcasting/film/video in the Last Year: Never true    Ran Out of Food in the Last Year: Never true  Transportation Needs: No Transportation Needs (05/31/2024)   Epic    Lack of Transportation (Medical): No    Lack of Transportation (Non-Medical): No  Physical Activity: Not on file  Stress: Not on file  Social Connections: Not on file  Depression (PHQ2-9): Low Risk (01/27/2023)   Depression (PHQ2-9)    PHQ-2 Score: 0  Alcohol Screen: Low Risk (05/31/2024)   Alcohol Screen    Last Alcohol Screening Score (AUDIT): 0  Housing: Low Risk (05/31/2024)   Epic    Unable to Pay for Housing in the Last Year: No    Number of Times Moved in the Last Year: 0    Homeless in the Last Year: No  Utilities: Not At Risk (05/31/2024)   Epic    Threatened with loss of utilities: No  Health Literacy: Not on file   Past Medical History:  Past Medical History:  Diagnosis Date   Anemia    Depression    Schizophrenia (HCC)     Past Surgical History:  Procedure Laterality Date   NO PAST SURGERIES      Current Medications: Current Facility-Administered Medications  Medication Dose Route Frequency Provider Last Rate Last Admin   acetaminophen  (TYLENOL ) tablet 650 mg  650 mg Oral Q4H PRN Jacquetta Sharlot GRADE, NP       alum & mag hydroxide-simeth (MAALOX/MYLANTA) 200-200-20 MG/5ML suspension 30 mL  30 mL Oral Q4H PRN Jacquetta Sharlot GRADE, NP       benzocaine -Menthol  (DERMOPLAST) 20-0.5 % topical spray 1 Application  1 Application Topical PRN Lord,  Jamison Y, NP       coconut oil  1 Application Topical PRN Jacquetta Sharlot GRADE, NP       witch hazel-glycerin  (TUCKS) pad 1 Application  1 Application Topical PRN Jacquetta Sharlot GRADE, NP       And   dibucaine (NUPERCAINAL) 1 % rectal ointment 1 Application  1 Application Rectal PRN Jacquetta Sharlot GRADE, NP       diphenhydrAMINE  (BENADRYL ) capsule 25 mg  25 mg Oral Q6H PRN Jacquetta Sharlot GRADE, NP  ferrous sulfate  tablet 325 mg  325 mg Oral Q1400 Jacquetta Sharlot GRADE, NP       ibuprofen  (ADVIL ) tablet 600 mg  600 mg Oral Q6H Lord, Sharlot GRADE, NP       magnesium  hydroxide (MILK OF MAGNESIA) suspension 30 mL  30 mL Oral Daily PRN Lord, Jamison Y, NP       OLANZapine  (ZYPREXA ) injection 5 mg  5 mg Intramuscular TID PRN Jacquetta Sharlot GRADE, NP       OLANZapine  (ZYPREXA ) tablet 5 mg  5 mg Oral QHS Madaram, Kondal R, MD       Or   OLANZapine  (ZYPREXA ) injection 5 mg  5 mg Intramuscular QHS Madaram, Kondal R, MD   5 mg at 06/02/24 2126   prenatal multivitamin tablet 1 tablet  1 tablet Oral Q1200 Jacquetta Sharlot GRADE, NP       senna-docusate (Senokot-S) tablet 2 tablet  2 tablet Oral Daily Jacquetta Sharlot GRADE, NP       simethicone  (MYLICON) chewable tablet 80 mg  80 mg Oral PRN Lord, Jamison Y, NP       zolpidem  (AMBIEN ) tablet 5 mg  5 mg Oral QHS PRN Jacquetta Sharlot GRADE, NP        Lab Results: No results found for this or any previous visit (from the past 48 hours).  Blood Alcohol level:  Lab Results  Component Value Date   [facility] <15 11/29/2023   ETH <10 10/11/2017    Metabolic Disorder Labs: Lab Results  Component Value Date   HGBA1C 5.0 05/29/2024   MPG 96.8 05/29/2024   MPG 91.06 10/18/2017   Lab Results  Component Value Date   PROLACTIN 166.7 (H) 10/18/2017   Lab Results  Component Value Date   CHOL 311 (H) 05/29/2024   TRIG 196 (H) 05/29/2024   HDL 55 05/29/2024   CHOLHDL 5.7 05/29/2024   VLDL 39 05/29/2024   LDLCALC 217 (H) 05/29/2024   LDLCALC 102 (H) 10/18/2017    Physical Findings: AIMS:  , ,  ,  ,     CIWA:    COWS:      Psychiatric Specialty Exam:  Presentation  General Appearance:  Appropriate for Environment; Casual  Eye Contact: Fair  Speech: Clear and Coherent  Speech Volume: Normal    Mood and Affect  Mood: Irritable  Affect: Blunt   Thought Process  Thought Processes: Disorganized  Orientation:Full (Time, Place and Person)  Thought Content:Delusions; Tangential  Hallucinations:Hallucinations: None  Ideas of Reference:Delusions  Suicidal Thoughts:Suicidal Thoughts: No  Homicidal Thoughts:Homicidal Thoughts: No   Sensorium  Memory: Immediate Poor  Judgment: Impaired  Insight: Lacking   Executive Functions  Concentration: Fair  Attention Span: Fair  Recall: Poor  Fund of Knowledge: Fair  Language: Fair   Psychomotor Activity  Psychomotor Activity: Psychomotor Activity: Normal  Musculoskeletal: Strength & Muscle Tone: within normal limits Gait & Station: normal Assets  Assets: Housing; Social Support    Physical Exam: Physical Exam Vitals and nursing note reviewed.  Constitutional:      Appearance: Normal appearance.  Pulmonary:     Effort: Pulmonary effort is normal.  Neurological:     Mental Status: She is alert.    Review of Systems  Respiratory:  Negative for shortness of breath.   Cardiovascular:  Negative for chest pain.  Gastrointestinal:  Negative for diarrhea, nausea and vomiting.  Psychiatric/Behavioral:  Negative for substance abuse and suicidal ideas.   All other systems reviewed and are negative.  Blood pressure ROLLEN)  111/97, pulse 82, temperature 98.2 F (36.8 C), temperature source Oral, resp. rate 18, last menstrual period 08/04/2023, SpO2 100%, unknown if currently breastfeeding. There is no height or weight on file to calculate BMI.  Diagnosis: Principal Problem:   Schizophrenia, undifferentiated (HCC)   PLAN: Safety and Monitoring:  -- Involuntary admission to inpatient  psychiatric unit for safety, stabilization and treatment  -- Daily contact with patient to assess and evaluate symptoms and progress in treatment  -- Patient's case to be discussed in multi-disciplinary team meeting  -- Observation Level : q15 minute checks  -- Vital signs:  q12 hours  -- Precautions: suicide, elopement, and assault -- Encouraged patient to participate in unit milieu and in scheduled group therapies   2. Psychiatric Treatment:  Scheduled Medications: Zyprexa  PO or IM - Forced medication order as per Dr. Ruther  Ambien  PRN sleep    -- The risks/benefits/side-effects/alternatives to this medication were discussed in detail with the patient and time was given for questions. The patient consents to medication trial.   3. Medical Issues Being Addressed:  Post Partum - patient declining vitamins/medications No medical concerns expressed  4. Discharge Planning:   -- Social work and case management to assist with discharge planning and identification of hospital follow-up needs prior to discharge  -- Estimated LOS: 5-7 days  Jacson Rapaport, NP 06/03/2024, 4:19 PM

## 2024-06-04 ENCOUNTER — Ambulatory Visit: Payer: Self-pay | Admitting: Obstetrics and Gynecology

## 2024-06-04 ENCOUNTER — Encounter: Payer: Self-pay | Admitting: Obstetrics and Gynecology

## 2024-06-04 MED ORDER — OLANZAPINE 10 MG PO TABS
10.0000 mg | ORAL_TABLET | Freq: Every day | ORAL | Status: DC
  Administered 2024-06-04 – 2024-06-10 (×7): 10 mg via ORAL
  Filled 2024-06-04 (×7): qty 1

## 2024-06-04 MED ORDER — OLANZAPINE 10 MG IM SOLR: 10.0000 mg | Freq: Every day | INTRAMUSCULAR | Status: DC

## 2024-06-04 NOTE — Progress Notes (Signed)
" °   06/04/24 9072  Psych Admission Type (Psych Patients Only)  Admission Status Involuntary  Psychosocial Assessment  Patient Complaints None  Eye Contact Fair  Facial Expression Flat  Affect Preoccupied  Speech Logical/coherent  Interaction Guarded  Motor Activity Restless  Appearance/Hygiene Unremarkable  Behavior Characteristics Cooperative;Guarded  Mood Preoccupied;Pleasant  Aggressive Behavior  Effect No apparent injury  Thought Process  Coherency WDL  Content Preoccupation  Delusions None reported or observed  Perception WDL  Hallucination None reported or observed  Judgment Impaired  Confusion Mild  Danger to Self  Current suicidal ideation? Denies  Agreement Not to Harm Self Yes  Description of Agreement verbal  Danger to Others  Danger to Others None reported or observed    "

## 2024-06-04 NOTE — BHH Counselor (Signed)
 CSW attempted to contact the patient's husband Rochella Benner, [phone].  CSW utilized [employer], [id number].  Call was not successful and HIPAA compliant voicemail was left.  Sherryle Margo, MSW, LCSW 06/04/2024 3:17 PM

## 2024-06-04 NOTE — Group Note (Deleted)
 Date:  06/04/2024 Time:  12:45 PM  Group Topic/Focus:  Building Self Esteem:   The Focus of this group is helping patients become aware of the effects of self-esteem on their lives, the things they and others do that enhance or undermine their self-esteem, seeing the relationship between their level of self-esteem and the choices they make and learning ways to enhance self-esteem.     Participation Level:  {BHH PARTICIPATION OZCZO:77735}  Participation Quality:  {BHH PARTICIPATION QUALITY:22265}  Affect:  {BHH AFFECT:22266}  Cognitive:  {BHH COGNITIVE:22267}  Insight: {BHH Insight2:20797}  Engagement in Group:  {BHH ENGAGEMENT IN HMNLE:77731}  Modes of Intervention:  {BHH MODES OF INTERVENTION:22269}  Additional Comments:  ***  [name] Brion Hedges 06/04/2024, 12:45 PM

## 2024-06-04 NOTE — Group Note (Signed)
 Recreation Therapy Group Note   Group Topic:General Recreation  Group Date: 06/04/2024 Start Time: 1530 End Time: 1615 Facilitators: Celestia Jeoffrey BRAVO, LRT, CTRS Location: Courtyard  Group Description: [employer]. LRT and patients played games of basketball, drew with chalk, and played corn hole while outside in the courtyard while getting fresh air and sunlight. Music was being played in the background. LRT and peers conversed about different games they have played before, what they do in their free time and anything else that is on their minds. LRT encouraged pts to drink water  after being outside, sweating and getting their heart rate up.  Goal Area(s) Addressed: Patient will build on frustration tolerance skills. Patients will partake in a competitive play game with peers. Patients will gain knowledge of new leisure interest/hobby.    Affect/Mood: N/A   Participation Level: Did not attend    Clinical Observations/Individualized Feedback: Patient did not attend.  Plan: Continue to engage patient in RT group sessions 2-3x/week.   Jeoffrey BRAVO Celestia, LRT, CTRS 06/04/2024 4:59 PM

## 2024-06-04 NOTE — Plan of Care (Signed)
" °  Problem: Education: Goal: Emotional status will improve Outcome: Progressing Goal: Mental status will improve Outcome: Progressing Goal: Verbalization of understanding the information provided will improve Outcome: Progressing   Problem: Activity: Goal: Interest or engagement in activities will improve Outcome: Not Progressing Goal: Sleeping patterns will improve Outcome: Not Progressing   Problem: Coping: Goal: Ability to verbalize frustrations and anger appropriately will improve Outcome: Progressing Goal: Ability to demonstrate self-control will improve Outcome: Progressing   "

## 2024-06-04 NOTE — Group Note (Signed)
 LCSW Group Therapy Note  Group Date: 06/04/2024 Start Time: 1315 End Time: 1400   Type of Therapy and Topic:  Group Therapy: Anger Cues and Responses  Participation Level:  Did Not Attend   Description of Group:   In this group, patients learned how to recognize the physical, cognitive, emotional, and behavioral responses they have to anger-provoking situations.  They identified a recent time they became angry and how they reacted.  They analyzed how their reaction was possibly beneficial and how it was possibly unhelpful.  The group discussed a variety of healthier coping skills that could help with such a situation in the future.  Focus was placed on how helpful it is to recognize the underlying emotions to our anger, because working on those can lead to a more permanent solution as well as our ability to focus on the important rather than the urgent.  Therapeutic Goals: Patients will remember their last incident of anger and how they felt emotionally and physically, what their thoughts were at the time, and how they behaved. Patients will identify how their behavior at that time worked for them, as well as how it worked against them. Patients will explore possible new behaviors to use in future anger situations. Patients will learn that anger itself is normal and cannot be eliminated, and that healthier reactions can assist with resolving conflict rather than worsening situations.  Summary of Patient Progress:   X  Therapeutic Modalities:   Cognitive Behavioral Therapy    Sherryle JINNY Margo, LCSW 06/04/2024  2:35 PM

## 2024-06-04 NOTE — Plan of Care (Signed)
  Problem: Education: Goal: Emotional status will improve Outcome: Progressing   Problem: Education: Goal: Knowledge of [facility] Education information/materials will improve Outcome: Progressing   Problem: Education: Goal: Mental status will improve Outcome: Progressing

## 2024-06-04 NOTE — Group Note (Signed)
 Recreation Therapy Group Note   Group Topic:Coping Skills  Group Date: 06/04/2024 Start Time: 1050 End Time: 1135 Facilitators: Celestia Jeoffrey BRAVO, LRT, CTRS Location: Craft Room  Group Description: Mind Map.  Patient was provided a blank template of a diagram with 32 blank boxes in a tiered system, branching from the center (similar to a bubble chart). LRT directed patients to label the middle of the diagram Coping Skills. LRT and patients then came up with 8 different coping skills as examples. Pt were directed to record their coping skills in the 2nd tier boxes closest to the center.  Patients would then share their coping skills with the group as LRT wrote them out. LRT gave a handout of 99 different coping skills at the end of group.   Goal Area(s) Addressed: Patients will be able to define coping skills. Patient will identify new coping skills.  Patient will increase communication.   Affect/Mood: N/A   Participation Level: Did not attend    Clinical Observations/Individualized Feedback: Patient did not attend.  Plan: Continue to engage patient in RT group sessions 2-3x/week.   Jeoffrey BRAVO Celestia, LRT, CTRS 06/04/2024 12:23 PM

## 2024-06-04 NOTE — Progress Notes (Signed)
 [facility] MD Progress Note  06/04/2024 4:45 PM Micole Delehanty  MRN:  [id number]   Subjective:  Chart reviewed, case discussed in multidisciplinary meeting, patient seen during rounds.  1/5: Patient is seen today with a virtual interpreter.  Patient speaks Arabic.  When asked about the reasons for her hospitalization patient is able to respond that she went to her hospital to have a baby and baby went home but patient was brought to the psychiatric hospital.  She reports living with husband and her children.  She denies any abuse or any stressors at home.  She remains discharge focused and wants to go home stating that the baby is on formula now but she wants to go breast-feed the baby.  She denies feeling depressed or sad.  She reports that she is just waiting for the time when her family visits her.  She reports fair appetite and sleep.  Per treatment team patient is responding to Zyprexa  but still has residual disorganized thought process at times.  Patient wanted to know the medication name that she is taking.  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  1/4: Patient seen in the presence of an interpreter. She reports that she is feeling normal and that she doesn't want to take medication. She reports the medication given last night made her sleep too long. When asked how long she slept, she reports she slept from 9 PM to 3 AM. She then reports that her normal sleeping pattern is like a normal person  from around 9 PM to 8  AM. Discussed that she has the option of taking oral medication as opposed to the shot if she felt like the shot made her sleep too long (Although it was brought to her attention that sleeping 9 PM to 3 AM was six hours). She then reported to writer that the injection last night was enough and she doesn't need anymore. She would like to go home and she can come back and follow up with psychiatry if needed in  the future. Discussed that that is not currently an option as she is under involuntary commitment and she reported that doctors told her she was fine and was no longer required to stay (Of which is not accurate). She denies SI, HI, AVH. She also denies ever having any psychiatric diagnoses. She then ends the assessment and gets on the phone to call her husband. She was not observed to be upset on the phone with her husband, but was able to return to her room and self regulate.  Patient continues to show delusional thinking. Her mood is irritable and her affect is blunt. She continues to deny medications and deny a history of a psychiatric disorder. Due to ongoing symptoms of psychosis, the patient continues to meet criteria for involuntary commitment.  1/3: Patient seen in the presence of an interpreter.   Initially, she reported that she is done with the doctors. She then was OK speaking to clinical research associate. She reports she has taken all of the medication that she needs and needs no more. She reports that the doctor told her she does not have to take medication. She is unable to identify which doctor this was.    Although being told several times that patient would be staying in the hospital she continue to state that she is being discharged with her husband. At one point stating that she has already been discharged. She  denies SI, HI, and AVH. Her thought process continues to contain delusions and is disorganized.   When discussing potential for forced medication, patient did not comprehend the information that was being told to her. Attempted to discuss taking oral medication and patient continue to decline. She continues to state that she does not need to see psychiatry.   MD spoke with family. Family reported that patient has been off of her medication for some time. They report she does much better when she is on medication and that this is not her baseline. They would like her to get restarted back on  medication. Please review MD note discussion forced medication and family discussion. Family confirmed to nursing that patient is not breastfeeding and that the infant is taking formula.   Past Psychiatric History: see h&P Family History:  Family History  Problem Relation Age of Onset   Hypertension Mother    Diabetes Mother    Diabetes Father    Hypertension Father    Social History:  Social History   Substance and Sexual Activity  Alcohol Use No   Alcohol/week: 0.0 standard drinks of alcohol     Social History   Substance and Sexual Activity  Drug Use No    Social History   Socioeconomic History   Marital status: Married    Spouse name: Not on file   Number of children: Not on file   Years of education: Not on file   Highest education level: Not on file  Occupational History   Not on file  Tobacco Use   Smoking status: Never   Smokeless tobacco: Never  Vaping Use   Vaping status: Never Used  Substance and Sexual Activity   Alcohol use: No    Alcohol/week: 0.0 standard drinks of alcohol   Drug use: No   Sexual activity: Yes  Other Topics Concern   Not on file  Social History Narrative   Not on file   Social Drivers of Health   Tobacco Use: Low Risk (05/31/2024)   Patient History    Smoking Tobacco Use: Never    Smokeless Tobacco Use: Never    Passive Exposure: Not on file  Financial Resource Strain: Not on file  Food Insecurity: No Food Insecurity (05/31/2024)   Epic    Worried About Programme Researcher, Broadcasting/film/video in the Last Year: Never true    Ran Out of Food in the Last Year: Never true  Transportation Needs: No Transportation Needs (05/31/2024)   Epic    Lack of Transportation (Medical): No    Lack of Transportation (Non-Medical): No  Physical Activity: Not on file  Stress: Not on file  Social Connections: Not on file  Depression (PHQ2-9): Low Risk (01/27/2023)   Depression (PHQ2-9)    PHQ-2 Score: 0  Alcohol Screen: Low Risk (05/31/2024)   Alcohol Screen     Last Alcohol Screening Score (AUDIT): 0  Housing: Low Risk (05/31/2024)   Epic    Unable to Pay for Housing in the Last Year: No    Number of Times Moved in the Last Year: 0    Homeless in the Last Year: No  Utilities: Not At Risk (05/31/2024)   Epic    Threatened with loss of utilities: No  Health Literacy: Not on file   Past Medical History:  Past Medical History:  Diagnosis Date   Anemia    Depression    Schizophrenia (HCC)     Past Surgical History:  Procedure Laterality Date  NO PAST SURGERIES      Current Medications: Current Facility-Administered Medications  Medication Dose Route Frequency Provider Last Rate Last Admin   acetaminophen  (TYLENOL ) tablet 650 mg  650 mg Oral Q4H PRN Lord, Jamison Y, NP       alum & mag hydroxide-simeth (MAALOX/MYLANTA) 200-200-20 MG/5ML suspension 30 mL  30 mL Oral Q4H PRN Lord, Jamison Y, NP       benzocaine -Menthol  (DERMOPLAST) 20-0.5 % topical spray 1 Application  1 Application Topical PRN Lord, Jamison Y, NP       coconut oil  1 Application Topical PRN Jacquetta Sharlot GRADE, NP       witch hazel-glycerin  (TUCKS) pad 1 Application  1 Application Topical PRN Jacquetta Sharlot GRADE, NP       And   dibucaine (NUPERCAINAL) 1 % rectal ointment 1 Application  1 Application Rectal PRN Jacquetta Sharlot GRADE, NP       diphenhydrAMINE  (BENADRYL ) capsule 25 mg  25 mg Oral Q6H PRN Jacquetta Sharlot GRADE, NP       ferrous sulfate  tablet 325 mg  325 mg Oral Q1400 Jacquetta Sharlot GRADE, NP       ibuprofen  (ADVIL ) tablet 600 mg  600 mg Oral Q6H Lord, Jamison Y, NP       magnesium  hydroxide (MILK OF MAGNESIA) suspension 30 mL  30 mL Oral Daily PRN Lord, Jamison Y, NP       OLANZapine  (ZYPREXA ) injection 5 mg  5 mg Intramuscular TID PRN Jacquetta Sharlot GRADE, NP       OLANZapine  (ZYPREXA ) tablet 5 mg  5 mg Oral QHS Madaram, Kondal R, MD   5 mg at 06/03/24 2124   Or   OLANZapine  (ZYPREXA ) injection 5 mg  5 mg Intramuscular QHS Madaram, Kondal R, MD   5 mg at 06/02/24 2126   prenatal  multivitamin tablet 1 tablet  1 tablet Oral Q1200 Jacquetta Sharlot GRADE, NP       senna-docusate (Senokot-S) tablet 2 tablet  2 tablet Oral Daily Jacquetta Sharlot GRADE, NP       simethicone  (MYLICON) chewable tablet 80 mg  80 mg Oral PRN Lord, Jamison Y, NP       zolpidem  (AMBIEN ) tablet 5 mg  5 mg Oral QHS PRN Lord, Jamison Y, NP   5 mg at 06/03/24 2123    Lab Results: No results found for this or any previous visit (from the past 48 hours).  Blood Alcohol level:  Lab Results  Component Value Date   [facility] <15 11/29/2023   ETH <10 10/11/2017    Metabolic Disorder Labs: Lab Results  Component Value Date   HGBA1C 5.0 05/29/2024   MPG 96.8 05/29/2024   MPG 91.06 10/18/2017   Lab Results  Component Value Date   PROLACTIN 166.7 (H) 10/18/2017   Lab Results  Component Value Date   CHOL 311 (H) 05/29/2024   TRIG 196 (H) 05/29/2024   HDL 55 05/29/2024   CHOLHDL 5.7 05/29/2024   VLDL 39 05/29/2024   LDLCALC 217 (H) 05/29/2024   LDLCALC 102 (H) 10/18/2017    Physical Findings: AIMS:  , ,  ,  ,    CIWA:    COWS:      Psychiatric Specialty Exam:  Presentation  General Appearance:  Appropriate for Environment; Casual  Eye Contact: Fair  Speech: Clear and Coherent  Speech Volume: Normal    Mood and Affect  Mood: Irritable  Affect: Blunt   Thought Process  Thought Processes: Disorganized Improving Orientation:Full (  Time, Place and Person)  Thought Content:Delusions; Tangential Resolving Hallucinations:Hallucinations: None  Ideas of Reference:Delusions  Suicidal Thoughts:Suicidal Thoughts: No  Homicidal Thoughts:Homicidal Thoughts: No   Sensorium  Memory: Immediate Poor  Judgment: Impaired  Insight: Lacking   Executive Functions  Concentration: Fair  Attention Span: Fair  Recall: Poor  Fund of Knowledge: Fair  Language: Fair   Psychomotor Activity  Psychomotor Activity: Psychomotor Activity: Normal  Musculoskeletal: Strength &  Muscle Tone: within normal limits Gait & Station: normal Assets  Assets: Housing; Social Support    Physical Exam: Physical Exam Vitals and nursing note reviewed.  Constitutional:      Appearance: Normal appearance.  Pulmonary:     Effort: Pulmonary effort is normal.  Neurological:     Mental Status: She is alert.    Review of Systems  Respiratory:  Negative for shortness of breath.   Cardiovascular:  Negative for chest pain.  Gastrointestinal:  Negative for diarrhea, nausea and vomiting.  Psychiatric/Behavioral:  Negative for substance abuse and suicidal ideas.   All other systems reviewed and are negative.  Blood pressure 121/87, pulse 92, temperature 98.2 F (36.8 C), temperature source Oral, resp. rate 19, last menstrual period 08/04/2023, SpO2 100%, unknown if currently breastfeeding. There is no height or weight on file to calculate BMI.  Diagnosis: Principal Problem:   Schizophrenia, undifferentiated (HCC)   PLAN: Safety and Monitoring:  -- Involuntary admission to inpatient psychiatric unit for safety, stabilization and treatment  -- Daily contact with patient to assess and evaluate symptoms and progress in treatment  -- Patient's case to be discussed in multi-disciplinary team meeting  -- Observation Level : q15 minute checks  -- Vital signs:  q12 hours  -- Precautions: suicide, elopement, and assault -- Encouraged patient to participate in unit milieu and in scheduled group therapies   2. Psychiatric Treatment:  Scheduled Medications: Zyprexa  increased to 10 mg nightly PO or IM - Forced medication order as per Dr. Ruther  Ambien  PRN sleep    -- The risks/benefits/side-effects/alternatives to this medication were discussed in detail with the patient and time was given for questions. The patient consents to medication trial.   3. Medical Issues Being Addressed:  Post Partum - patient declining vitamins/medications No medical concerns expressed  4.  Discharge Planning:   -- Social work and case management to assist with discharge planning and identification of hospital follow-up needs prior to discharge  -- Estimated LOS: 5-7 days  Javontay Vandam, MD 06/04/2024, 4:45 PM

## 2024-06-04 NOTE — Group Note (Signed)
 Date:  06/04/2024 Time:  8:51 PM  Group Topic/Focus:  Wrap-Up Group:   The focus of this group is to help patients review their daily goal of treatment and discuss progress on daily workbooks.    Participation Level:  Did Not Attend    Amy Mcclain 06/04/2024, 8:51 PM

## 2024-06-05 NOTE — Group Note (Signed)
 Recreation Therapy Group Note   Group Topic:Animal Assisted Therapy   Group Date: 06/05/2024 Start Time: 1000 End Time: 1020 Facilitators: Celestia Jeoffrey BRAVO, LRT, CTRS Location: Dayroom  Group Description: AAA. Animal-Assisted Activity provides opportunities for motivational, educational, therapeutic and/or recreational benefits to enhance quality of life. Selinda and Rollo visited the unit to interact with patients.    Goal Areas Addressed:  Reduced anxiety and stress Improved mood Increased social interaction Enhanced communication skills Reduced loneliness and isolation Improved emotional regulation   Affect/Mood: N/A   Participation Level: Did not attend    Clinical Observations/Individualized Feedback: Patient did not attend.   Plan: Continue to engage patient in RT group sessions 2-3x/week.   Jeoffrey BRAVO Celestia, LRT, CTRS 06/05/2024 11:40 AM

## 2024-06-05 NOTE — Plan of Care (Signed)
" °  Problem: Education: Goal: Emotional status will improve Outcome: Progressing Goal: Mental status will improve Outcome: Progressing   Problem: Nutrition: Goal: Adequate nutrition will be maintained Outcome: Progressing   Problem: Elimination: Goal: Will not experience complications related to bowel motility Outcome: Progressing   Problem: Pain Managment: Goal: General experience of comfort will improve and/or be controlled Outcome: Progressing   Problem: Safety: Goal: Ability to remain free from injury will improve Outcome: Progressing   "
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
## 2024-06-05 NOTE — Group Note (Signed)
 Date:  06/05/2024 Time:  7:52 AM  Group Topic/Focus:  Self Care:   The focus of this group is to help patients understand the importance of self-care in order to improve or restore emotional, physical, spiritual, interpersonal, and financial health. MHT took patients outside to get some air and enjoy nature.    Participation Level:  Did Not Attend  Leigh VEAR Pais 06/05/2024, 7:52 AM

## 2024-06-05 NOTE — Progress Notes (Signed)
" °   06/05/24 1100  Psych Admission Type (Psych Patients Only)  Admission Status Involuntary  Psychosocial Assessment  Patient Complaints Other (Comment) ( heavyness on my eyes.)  Eye Contact Fair  Facial Expression Flat  Affect Preoccupied  Speech Logical/coherent  Interaction Guarded  Motor Activity Other (Comment) (WNL)  Appearance/Hygiene Unremarkable  Behavior Characteristics Irritable;Cooperative  Mood Preoccupied  Aggressive Behavior  Effect No apparent injury  Thought Process  Coherency WDL  Content Preoccupation  Delusions None reported or observed  Perception Hallucinations  Hallucination Olfactory ( smell bad like poop.)  Judgment Impaired  Confusion None  Danger to Self  Current suicidal ideation? Denies  Agreement Not to Harm Self Yes  Description of Agreement verbal  Danger to Others  Danger to Others None reported or observed   Patient insisting to go home. Refused to take vitamins.States  I will takes medicines at home.Patient not irritable at this time.  "

## 2024-06-05 NOTE — Progress Notes (Signed)
 [facility] MD Progress Note  06/05/2024 8:38 PM Amy Mcclain  MRN:  [id number]   Subjective:  Chart reviewed, case discussed in multidisciplinary meeting, patient seen during rounds.   1/6: Interpreter was used face-to-face during this interview.  Patient continues to minimize her mental health presentation, remains discharge focused stating her baby is waiting for her and patient wants to breast-feed the baby.  She is unable to acknowledge the events that led up to the hospitalization.  She denies SI/HI/plan and denies hallucinations.  Social work team and this provider reached out to patient's husband through interpreter and patient husband continues to express his concern that patient is still displaying paranoia and disorganized thought process and is not at her baseline.  Has been informed the team that patient was getting LAI but is unable to recall the name of the medication..  He agreed to provide contact details of patient's psychiatrist outpatient and the name of the medication so that patient can be restarted on it.  Family is not comfortable with patient discharge at this time.  1/5: Patient is seen today with a virtual interpreter.  Patient speaks Arabic.  When asked about the reasons for her hospitalization patient is able to respond that she went to her hospital to have a baby and baby went home but patient was brought to the psychiatric hospital.  She reports living with husband and her children.  She denies any abuse or any stressors at home.  She remains discharge focused and wants to go home stating that the baby is on formula now but she wants to go breast-feed the baby.  She denies feeling depressed or sad.  She reports that she is just waiting for the time when her family visits her.  She reports fair appetite and sleep.  Per treatment team patient is responding to Zyprexa  but still has residual disorganized thought process at times.  Patient wanted to know the medication name that she is  taking.  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  1/4: Patient seen in the presence of an interpreter. She reports that she is feeling normal and that she doesn't want to take medication. She reports the medication given last night made her sleep too long. When asked how long she slept, she reports she slept from 9 PM to 3 AM. She then reports that her normal sleeping pattern is like a normal person  from around 9 PM to 8  AM. Discussed that she has the option of taking oral medication as opposed to the shot if she felt like the shot made her sleep too long (Although it was brought to her attention that sleeping 9 PM to 3 AM was six hours). She then reported to writer that the injection last night was enough and she doesn't need anymore. She would like to go home and she can come back and follow up with psychiatry if needed in the future. Discussed that that is not currently an option as she is under involuntary commitment and she reported that doctors told her she was fine and was no longer required to stay (Of which is not accurate). She denies SI, HI, AVH. She also denies ever having any psychiatric diagnoses. She then ends the assessment and gets on the phone to call her husband. She was not observed to be upset on the phone with her husband, but was able to return to her room and self  regulate.  Patient continues to show delusional thinking. Her mood is irritable and her affect is blunt. She continues to deny medications and deny a history of a psychiatric disorder. Due to ongoing symptoms of psychosis, the patient continues to meet criteria for involuntary commitment.  1/3: Patient seen in the presence of an interpreter.   Initially, she reported that she is done with the doctors. She then was OK speaking to clinical research associate. She reports she has taken all of the medication that she needs and needs no more. She reports  that the doctor told her she does not have to take medication. She is unable to identify which doctor this was.    Although being told several times that patient would be staying in the hospital she continue to state that she is being discharged with her husband. At one point stating that she has already been discharged. She denies SI, HI, and AVH. Her thought process continues to contain delusions and is disorganized.   When discussing potential for forced medication, patient did not comprehend the information that was being told to her. Attempted to discuss taking oral medication and patient continue to decline. She continues to state that she does not need to see psychiatry.   MD spoke with family. Family reported that patient has been off of her medication for some time. They report she does much better when she is on medication and that this is not her baseline. They would like her to get restarted back on medication. Please review MD note discussion forced medication and family discussion. Family confirmed to nursing that patient is not breastfeeding and that the infant is taking formula.   Past Psychiatric History: see h&P Family History:  Family History  Problem Relation Age of Onset   Hypertension Mother    Diabetes Mother    Diabetes Father    Hypertension Father    Social History:  Social History   Substance and Sexual Activity  Alcohol Use No   Alcohol/week: 0.0 standard drinks of alcohol     Social History   Substance and Sexual Activity  Drug Use No    Social History   Socioeconomic History   Marital status: Married    Spouse name: Not on file   Number of children: Not on file   Years of education: Not on file   Highest education level: Not on file  Occupational History   Not on file  Tobacco Use   Smoking status: Never   Smokeless tobacco: Never  Vaping Use   Vaping status: Never Used  Substance and Sexual Activity   Alcohol use: No    Alcohol/week: 0.0  standard drinks of alcohol   Drug use: No   Sexual activity: Yes  Other Topics Concern   Not on file  Social History Narrative   Not on file   Social Drivers of Health   Tobacco Use: Low Risk (05/31/2024)   Patient History    Smoking Tobacco Use: Never    Smokeless Tobacco Use: Never    Passive Exposure: Not on file  Financial Resource Strain: Not on file  Food Insecurity: No Food Insecurity (05/31/2024)   Epic    Worried About Programme Researcher, Broadcasting/film/video in the Last Year: Never true    Ran Out of Food in the Last Year: Never true  Transportation Needs: No Transportation Needs (05/31/2024)   Epic    Lack of Transportation (Medical): No    Lack of Transportation (Non-Medical): No  Physical Activity:  Not on file  Stress: Not on file  Social Connections: Not on file  Depression (PHQ2-9): Low Risk (01/27/2023)   Depression (PHQ2-9)    PHQ-2 Score: 0  Alcohol Screen: Low Risk (05/31/2024)   Alcohol Screen    Last Alcohol Screening Score (AUDIT): 0  Housing: Low Risk (05/31/2024)   Epic    Unable to Pay for Housing in the Last Year: No    Number of Times Moved in the Last Year: 0    Homeless in the Last Year: No  Utilities: Not At Risk (05/31/2024)   Epic    Threatened with loss of utilities: No  Health Literacy: Not on file   Past Medical History:  Past Medical History:  Diagnosis Date   Anemia    Depression    Schizophrenia (HCC)     Past Surgical History:  Procedure Laterality Date   NO PAST SURGERIES      Current Medications: Current Facility-Administered Medications  Medication Dose Route Frequency Provider Last Rate Last Admin   acetaminophen  (TYLENOL ) tablet 650 mg  650 mg Oral Q4H PRN Jacquetta Sharlot GRADE, NP       alum & mag hydroxide-simeth (MAALOX/MYLANTA) 200-200-20 MG/5ML suspension 30 mL  30 mL Oral Q4H PRN Jacquetta Sharlot GRADE, NP       benzocaine -Menthol  (DERMOPLAST) 20-0.5 % topical spray 1 Application  1 Application Topical PRN Lord, Jamison Y, NP       coconut oil  1  Application Topical PRN Jacquetta Sharlot GRADE, NP       witch hazel-glycerin  (TUCKS) pad 1 Application  1 Application Topical PRN Jacquetta Sharlot GRADE, NP       And   dibucaine (NUPERCAINAL) 1 % rectal ointment 1 Application  1 Application Rectal PRN Jacquetta Sharlot GRADE, NP       diphenhydrAMINE  (BENADRYL ) capsule 25 mg  25 mg Oral Q6H PRN Lord, Jamison Y, NP       ferrous sulfate  tablet 325 mg  325 mg Oral Q1400 Jacquetta Sharlot GRADE, NP       ibuprofen  (ADVIL ) tablet 600 mg  600 mg Oral Q6H Lord, Sharlot GRADE, NP       magnesium  hydroxide (MILK OF MAGNESIA) suspension 30 mL  30 mL Oral Daily PRN Jacquetta Sharlot GRADE, NP       OLANZapine  (ZYPREXA ) tablet 10 mg  10 mg Oral QHS Mailynn Everly, MD   10 mg at 06/04/24 2117   Or   OLANZapine  (ZYPREXA ) injection 10 mg  10 mg Intramuscular QHS Thayden Lemire, MD       OLANZapine  (ZYPREXA ) injection 5 mg  5 mg Intramuscular TID PRN Jacquetta Sharlot GRADE, NP       prenatal multivitamin tablet 1 tablet  1 tablet Oral Q1200 Jacquetta Sharlot GRADE, NP       senna-docusate (Senokot-S) tablet 2 tablet  2 tablet Oral Daily Lord, Jamison Y, NP       simethicone  (MYLICON) chewable tablet 80 mg  80 mg Oral PRN Jacquetta Sharlot GRADE, NP       zolpidem  (AMBIEN ) tablet 5 mg  5 mg Oral QHS PRN Jacquetta Sharlot GRADE, NP   5 mg at 06/03/24 2123    Lab Results: No results found for this or any previous visit (from the past 48 hours).  Blood Alcohol level:  Lab Results  Component Value Date   [facility] <15 11/29/2023   ETH <10 10/11/2017    Metabolic Disorder Labs: Lab Results  Component Value Date   HGBA1C  5.0 05/29/2024   MPG 96.8 05/29/2024   MPG 91.06 10/18/2017   Lab Results  Component Value Date   PROLACTIN 166.7 (H) 10/18/2017   Lab Results  Component Value Date   CHOL 311 (H) 05/29/2024   TRIG 196 (H) 05/29/2024   HDL 55 05/29/2024   CHOLHDL 5.7 05/29/2024   VLDL 39 05/29/2024   LDLCALC 217 (H) 05/29/2024   LDLCALC 102 (H) 10/18/2017    Physical Findings: AIMS:  , ,  ,  ,    CIWA:     COWS:      Psychiatric Specialty Exam:  Presentation  General Appearance:  Appropriate for Environment; Casual  Eye Contact: Fair  Speech: Clear and Coherent  Speech Volume: Normal    Mood and Affect  Mood: Irritable  Affect: Blunt   Thought Process  Thought Processes: Disorganized Improving Orientation:Full (Time, Place and Person)  Thought Content:Delusions; Tangential Resolving Hallucinations: Denies  Ideas of Reference:Delusions  Suicidal Thoughts DeniesHomicidal Thoughts: Denies   Sensorium  Memory: Immediate Poor  Judgment: Impaired  Insight: Lacking   Executive Functions  Concentration: Fair  Attention Span: Fair  Recall: Poor  Fund of Knowledge: Fair  Language: Fair   Psychomotor Activity  Psychomotor Activity: No data recorded  Musculoskeletal: Strength & Muscle Tone: within normal limits Gait & Station: normal Assets  Assets: Housing; Social Support    Physical Exam: Physical Exam Vitals and nursing note reviewed.  Constitutional:      Appearance: Normal appearance.  Pulmonary:     Effort: Pulmonary effort is normal.  Neurological:     Mental Status: She is alert.    Review of Systems  Respiratory:  Negative for shortness of breath.   Cardiovascular:  Negative for chest pain.  Gastrointestinal:  Negative for diarrhea, nausea and vomiting.  Psychiatric/Behavioral:  Negative for substance abuse and suicidal ideas.   All other systems reviewed and are negative.  Blood pressure 135/73, pulse 87, temperature 98.2 F (36.8 C), temperature source Oral, resp. rate 19, last menstrual period 08/04/2023, SpO2 100%, unknown if currently breastfeeding. There is no height or weight on file to calculate BMI.  Diagnosis: Principal Problem:   Schizophrenia, undifferentiated (HCC)   PLAN: Safety and Monitoring:  -- Involuntary admission to inpatient psychiatric unit for safety, stabilization and treatment  --  Daily contact with patient to assess and evaluate symptoms and progress in treatment  -- Patient's case to be discussed in multi-disciplinary team meeting  -- Observation Level : q15 minute checks  -- Vital signs:  q12 hours  -- Precautions: suicide, elopement, and assault -- Encouraged patient to participate in unit milieu and in scheduled group therapies   2. Psychiatric Treatment:  Scheduled Medications: Zyprexa  increased to 10 mg nightly PO or IM - Forced medication order as per Dr. Ruther  Ambien  PRN sleep    -- The risks/benefits/side-effects/alternatives to this medication were discussed in detail with the patient and time was given for questions. The patient consents to medication trial.   3. Medical Issues Being Addressed:  Post Partum - patient declining vitamins/medications No medical concerns expressed  4. Discharge Planning:   -- Social work and case management to assist with discharge planning and identification of hospital follow-up needs prior to discharge  -- Estimated LOS: 5-7 days  Amber Guthridge, MD 06/05/2024, 8:38 PM

## 2024-06-05 NOTE — Plan of Care (Signed)
   Problem: Education: Goal: Knowledge of [facility] Education information/materials will improve Outcome: Progressing Goal: Emotional status will improve Outcome: Progressing Goal: Mental status will improve Outcome: Progressing Goal: Verbalization of understanding the information provided will improve Outcome: Progressing   Problem: Activity: Goal: Interest or engagement in activities will improve Outcome: Progressing Goal: Sleeping patterns will improve Outcome: Progressing   Problem: Coping: Goal: Ability to verbalize frustrations and anger appropriately will improve Outcome: Progressing Goal: Ability to demonstrate self-control will improve Outcome: Progressing   Problem: Health Behavior/Discharge Planning: Goal: Identification of resources available to assist in meeting health care needs will improve Outcome: Progressing Goal: Compliance with treatment plan for underlying cause of condition will improve Outcome: Progressing   Problem: Physical Regulation: Goal: Ability to maintain clinical measurements within normal limits will improve Outcome: Progressing   Problem: Safety: Goal: Periods of time without injury will increase Outcome: Progressing   Problem: Education: Goal: Knowledge of General Education information will improve Description: Including pain rating scale, medication(s)/side effects and non-pharmacologic comfort measures Outcome: Progressing   Problem: Health Behavior/Discharge Planning: Goal: Ability to manage health-related needs will improve Outcome: Progressing   Problem: Clinical Measurements: Goal: Ability to maintain clinical measurements within normal limits will improve Outcome: Progressing Goal: Will remain free from infection Outcome: Progressing Goal: Diagnostic test results will improve Outcome: Progressing Goal: Respiratory complications will improve Outcome: Progressing Goal: Cardiovascular complication will be  avoided Outcome: Progressing   Problem: Activity: Goal: Risk for activity intolerance will decrease Outcome: Progressing   Problem: Nutrition: Goal: Adequate nutrition will be maintained Outcome: Progressing   Problem: Coping: Goal: Level of anxiety will decrease Outcome: Progressing   Problem: Elimination: Goal: Will not experience complications related to bowel motility Outcome: Progressing Goal: Will not experience complications related to urinary retention Outcome: Progressing   Problem: Pain Managment: Goal: General experience of comfort will improve and/or be controlled Outcome: Progressing   Problem: Safety: Goal: Ability to remain free from injury will improve Outcome: Progressing   Problem: Skin Integrity: Goal: Risk for impaired skin integrity will decrease Outcome: Progressing

## 2024-06-05 NOTE — Group Note (Signed)
 Date:  06/05/2024 Time:  10:17 PM  Group Topic/Focus:  Primary and Secondary Emotions:   The focus of this group is to discuss the difference between primary and secondary emotions. Wrap-Up Group:   The focus of this group is to help patients review their daily goal of treatment and discuss progress on daily workbooks.    Participation Level:  Did Not Attend    Amy Mcclain 06/05/2024, 10:17 PM

## 2024-06-05 NOTE — Group Note (Signed)
 Recreation Therapy Group Note   Group Topic:Health and Wellness  Group Date: 06/05/2024 Start Time: 1445 End Time: 1545 Facilitators: Celestia Jeoffrey BRAVO, LRT, CTRS Location: Courtyard  Group Description: [employer]. LRT and patients played games of basketball, drew with chalk, and played corn hole while outside in the courtyard while getting fresh air and sunlight. Music was being played in the background. LRT and peers conversed about different games they have played before, what they do in their free time and anything else that is on their minds. LRT encouraged pts to drink water  after being outside, sweating and getting their heart rate up.  Goal Area(s) Addressed: Patient will build on frustration tolerance skills. Patients will partake in a competitive play game with peers. Patients will gain knowledge of new leisure interest/hobby.    Affect/Mood: N/A   Participation Level: Did not attend    Clinical Observations/Individualized Feedback: Patient did not attend.  Plan: Continue to engage patient in RT group sessions 2-3x/week.   Jeoffrey BRAVO Celestia, LRT, CTRS 06/05/2024 3:54 PM

## 2024-06-05 NOTE — Group Note (Signed)
 [facility] LCSW Group Therapy Note   Group Date: 06/05/2024 Start Time: 1300 End Time: 1400  Type of Therapy/Topic:  Group Therapy:  Feelings about Diagnosis  Participation Level:  Did Not Attend   Description of Group:    This group will allow patients to explore their thoughts and feelings about diagnoses they have received. Patients will be guided to explore their level of understanding and acceptance of these diagnoses. Facilitator will encourage patients to process their thoughts and feelings about the reactions of others to their diagnosis, and will guide patients in identifying ways to discuss their diagnosis with significant others in their lives. This group will be process-oriented, with patients participating in exploration of their own experiences as well as giving and receiving support and challenge from other group members.   Therapeutic Goals: 1. Patient will demonstrate understanding of diagnosis as evidence by identifying two or more symptoms of the disorder:  2. Patient will be able to express two feelings regarding the diagnosis 3. Patient will demonstrate ability to communicate their needs through discussion and/or role plays  Summary of Patient Progress: Patient did not attend group.   Therapeutic Modalities:   Cognitive Behavioral Therapy Brief Therapy Feelings Identification    Nadara JONELLE Fam, LCSW

## 2024-06-05 NOTE — Progress Notes (Signed)
" °   06/04/24 2000  Psych Admission Type (Psych Patients Only)  Admission Status Involuntary  Psychosocial Assessment  Patient Complaints None  Eye Contact Fair  Facial Expression Flat  Affect Preoccupied  Speech Logical/coherent  Interaction Guarded  Motor Activity Restless  Appearance/Hygiene Unremarkable  Behavior Characteristics Cooperative  Mood Preoccupied  Aggressive Behavior  Effect No apparent injury  Thought Process  Coherency WDL  Content Preoccupation  Delusions None reported or observed  Perception WDL  Hallucination None reported or observed  Judgment Impaired  Confusion Mild  Danger to Self  Current suicidal ideation? Denies  Agreement Not to Harm Self Yes  Danger to Others  Danger to Others None reported or observed    "

## 2024-06-05 NOTE — [facility] Suicide Risk Assessment (Signed)
 [facility] INPATIENT:  Family/Significant Other Suicide Prevention Education  Suicide Prevention Education:  Education Completed; Luara Faye, husband, [phone],  (name of family member/significant other) has been identified by the patient as the family member/significant other with whom the patient will be residing, and identified as the person(s) who will aid the patient in the event of a mental health crisis (suicidal ideations/suicide attempt).  With written consent from the patient, the family member/significant other has been provided the following suicide prevention education, prior to the and/or following the discharge of the patient.  The suicide prevention education provided includes the following: Suicide risk factors Suicide prevention and interventions National Suicide Hotline telephone number [facility] assessment telephone number [facility] Emergency Assistance 911 [facility] and/or Residential Mobile Crisis Unit telephone number  Request made of family/significant other to: Remove weapons (e.g., guns, rifles, knives), all items previously/currently identified as safety concern.   Remove drugs/medications (over-the-counter, prescriptions, illicit drugs), all items previously/currently identified as a safety concern.  The family member/significant other verbalizes understanding of the suicide prevention education information provided.  The family member/significant other agrees to remove the items of safety concern listed above.  Sherryle JINNY Margo 06/05/2024, 2:04 PM

## 2024-06-05 NOTE — Group Note (Signed)
 Date:  06/05/2024 Time:  7:35 AM  Group Topic/Focus:  Wellness Toolbox:   The focus of this group is to discuss various aspects of wellness, balancing those aspects and exploring ways to increase the ability to experience wellness.  Patients will create a wellness toolbox for use upon discharge.    Participation Level:  Did Not Attend  Leigh VEAR Pais 06/05/2024, 7:35 AM

## 2024-06-06 NOTE — [facility] Counselor (Signed)
 CSW spoke with the patient regarding aftercare plans.    Patient declined CSW assistance in scheduling appointment for therapy or psychiatry stating that her husband and daughter will assist her at discharge.   CSW explained that husband expressed that he would like hospital assistance in getting appointment established.    Patient reports that she does not care what her husband wishes, she will have the appointment scheduled following discharge.   In person interpreter Zena Bio assisted in conversation.  Sherryle Margo, MSW, LCSW 06/06/2024 9:59 AM

## 2024-06-06 NOTE — Group Note (Signed)
 Date:  06/06/2024 Time:  10:38 AM  Group Topic/Focus:  Healthy Communication:   The focus of this group is to discuss communication, barriers to communication, as well as healthy ways to communicate with others.    Participation Level:  Did Not Attend  Participation Quality:    Affect:    Cognitive:    Insight:   Engagement in Group:    Modes of Intervention:    Additional Comments:    Amy Mcclain 06/06/2024, 10:38 AM

## 2024-06-06 NOTE — [facility] Counselor (Signed)
 CSW spoke with the patient's husband with the use of Sarra the onsite interpreter.    Husband expressed confusion and frustration over the pt declining her medications and not being forced to take her medication, as reported by nurse previously.   CSW expressed understanding.   CSW updated husband that at this time the patient is declining referral for aftercare and can not be made to allow hospital to assist in making appointment.  Husband again expressed frustration.  He reports that you can not send her home like this, she is not normal.   CSW expressed understanding.   He reports that the patient's provider was [facility], [address]  Sherryle Margo, MSW, LCSW 06/06/2024 1:38 PM

## 2024-06-06 NOTE — Plan of Care (Signed)
" °  Problem: Education: Goal: Emotional status will improve Outcome: Progressing Goal: Verbalization of understanding the information provided will improve Outcome: Progressing   Problem: Coping: Goal: Ability to verbalize frustrations and anger appropriately will improve Outcome: Progressing   Problem: Safety: Goal: Periods of time without injury will increase Outcome: Progressing   Problem: Clinical Measurements: Goal: Ability to maintain clinical measurements within normal limits will improve Outcome: Progressing   "

## 2024-06-06 NOTE — Progress Notes (Signed)
 [facility] MD Progress Note  06/06/2024 2:01 PM Amy Mcclain  MRN:  [id number]   Subjective:  Chart reviewed, case discussed in multidisciplinary meeting, patient seen during rounds.   06/06/24: Patient is interviewed today in the presence of interpreter.  Patient denies SI/HI/hallucinations.  When provider tried to discuss long-acting injectable that she was taking in the past patient brushed it off saying she does not have any diagnosis of schizophrenia and that her doctor told her she does not need any shots anymore.  Later on she says she will not take any LAI's until 6 months after her discharge.  Patient lacks insight into her mental health problems.  Patient is taking the oral Zyprexa .  She denies any side effects to the medications.   1/6: Interpreter was used face-to-face during this interview.  Patient continues to minimize her mental health presentation, remains discharge focused stating her baby is waiting for her and patient wants to breast-feed the baby.  She is unable to acknowledge the events that led up to the hospitalization.  She denies SI/HI/plan and denies hallucinations.  Social work team and this provider reached out to patient's husband through interpreter and patient husband continues to express his concern that patient is still displaying paranoia and disorganized thought process and is not at her baseline.  Has been informed the team that patient was getting LAI but is unable to recall the name of the medication..  He agreed to provide contact details of patient's psychiatrist outpatient and the name of the medication so that patient can be restarted on it.  Family is not comfortable with patient discharge at this time.  1/5: Patient is seen today with a virtual interpreter.  Patient speaks Arabic.  When asked about the reasons for her hospitalization patient is able to respond that she went to her hospital to have a baby and baby went home but patient was brought to the psychiatric  hospital.  She reports living with husband and her children.  She denies any abuse or any stressors at home.  She remains discharge focused and wants to go home stating that the baby is on formula now but she wants to go breast-feed the baby.  She denies feeling depressed or sad.  She reports that she is just waiting for the time when her family visits her.  She reports fair appetite and sleep.  Per treatment team patient is responding to Zyprexa  but still has residual disorganized thought process at times.  Patient wanted to know the medication name that she is taking.  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  1/4: Patient seen in the presence of an interpreter. She reports that she is feeling normal and that she doesn't want to take medication. She reports the medication given last night made her sleep too long. When asked how long she slept, she reports she slept from 9 PM to 3 AM. She then reports that her normal sleeping pattern is like a normal person  from around 9 PM to 8  AM. Discussed that she has the option of taking oral medication as opposed to the shot if she felt like the shot made her sleep too long (Although it was brought to her attention that sleeping 9 PM to 3 AM was six hours). She then reported to writer that the injection last night was enough and she doesn't need anymore. She would like to go home and she  can come back and follow up with psychiatry if needed in the future. Discussed that that is not currently an option as she is under involuntary commitment and she reported that doctors told her she was fine and was no longer required to stay (Of which is not accurate). She denies SI, HI, AVH. She also denies ever having any psychiatric diagnoses. She then ends the assessment and gets on the phone to call her husband. She was not observed to be upset on the phone with her husband, but was able to  return to her room and self regulate.  Patient continues to show delusional thinking. Her mood is irritable and her affect is blunt. She continues to deny medications and deny a history of a psychiatric disorder. Due to ongoing symptoms of psychosis, the patient continues to meet criteria for involuntary commitment.  1/3: Patient seen in the presence of an interpreter.   Initially, she reported that she is done with the doctors. She then was OK speaking to clinical research associate. She reports she has taken all of the medication that she needs and needs no more. She reports that the doctor told her she does not have to take medication. She is unable to identify which doctor this was.    Although being told several times that patient would be staying in the hospital she continue to state that she is being discharged with her husband. At one point stating that she has already been discharged. She denies SI, HI, and AVH. Her thought process continues to contain delusions and is disorganized.   When discussing potential for forced medication, patient did not comprehend the information that was being told to her. Attempted to discuss taking oral medication and patient continue to decline. She continues to state that she does not need to see psychiatry.   MD spoke with family. Family reported that patient has been off of her medication for some time. They report she does much better when she is on medication and that this is not her baseline. They would like her to get restarted back on medication. Please review MD note discussion forced medication and family discussion. Family confirmed to nursing that patient is not breastfeeding and that the infant is taking formula.   Past Psychiatric History: see h&P Family History:  Family History  Problem Relation Age of Onset   Hypertension Mother    Diabetes Mother    Diabetes Father    Hypertension Father    Social History:  Social History   Substance and Sexual Activity   Alcohol Use No   Alcohol/week: 0.0 standard drinks of alcohol     Social History   Substance and Sexual Activity  Drug Use No    Social History   Socioeconomic History   Marital status: Married    Spouse name: Not on file   Number of children: Not on file   Years of education: Not on file   Highest education level: Not on file  Occupational History   Not on file  Tobacco Use   Smoking status: Never   Smokeless tobacco: Never  Vaping Use   Vaping status: Never Used  Substance and Sexual Activity   Alcohol use: No    Alcohol/week: 0.0 standard drinks of alcohol   Drug use: No   Sexual activity: Yes  Other Topics Concern   Not on file  Social History Narrative   Not on file   Social Drivers of Health   Tobacco Use: Low Risk (05/31/2024)  Patient History    Smoking Tobacco Use: Never    Smokeless Tobacco Use: Never    Passive Exposure: Not on file  Financial Resource Strain: Not on file  Food Insecurity: No Food Insecurity (05/31/2024)   Epic    Worried About Programme Researcher, Broadcasting/film/video in the Last Year: Never true    Ran Out of Food in the Last Year: Never true  Transportation Needs: No Transportation Needs (05/31/2024)   Epic    Lack of Transportation (Medical): No    Lack of Transportation (Non-Medical): No  Physical Activity: Not on file  Stress: Not on file  Social Connections: Not on file  Depression (PHQ2-9): Low Risk (01/27/2023)   Depression (PHQ2-9)    PHQ-2 Score: 0  Alcohol Screen: Low Risk (05/31/2024)   Alcohol Screen    Last Alcohol Screening Score (AUDIT): 0  Housing: Low Risk (05/31/2024)   Epic    Unable to Pay for Housing in the Last Year: No    Number of Times Moved in the Last Year: 0    Homeless in the Last Year: No  Utilities: Not At Risk (05/31/2024)   Epic    Threatened with loss of utilities: No  Health Literacy: Not on file   Past Medical History:  Past Medical History:  Diagnosis Date   Anemia    Depression    Schizophrenia (HCC)      Past Surgical History:  Procedure Laterality Date   NO PAST SURGERIES      Current Medications: Current Facility-Administered Medications  Medication Dose Route Frequency Provider Last Rate Last Admin   acetaminophen  (TYLENOL ) tablet 650 mg  650 mg Oral Q4H PRN Jacquetta Sharlot GRADE, NP       alum & mag hydroxide-simeth (MAALOX/MYLANTA) 200-200-20 MG/5ML suspension 30 mL  30 mL Oral Q4H PRN Jacquetta Sharlot GRADE, NP       benzocaine -Menthol  (DERMOPLAST) 20-0.5 % topical spray 1 Application  1 Application Topical PRN Lord, Jamison Y, NP       coconut oil  1 Application Topical PRN Jacquetta Sharlot GRADE, NP       witch hazel-glycerin  (TUCKS) pad 1 Application  1 Application Topical PRN Jacquetta Sharlot GRADE, NP       And   dibucaine (NUPERCAINAL) 1 % rectal ointment 1 Application  1 Application Rectal PRN Jacquetta Sharlot GRADE, NP       diphenhydrAMINE  (BENADRYL ) capsule 25 mg  25 mg Oral Q6H PRN Jacquetta Sharlot GRADE, NP       ferrous sulfate  tablet 325 mg  325 mg Oral Q1400 Jacquetta Sharlot GRADE, NP       ibuprofen  (ADVIL ) tablet 600 mg  600 mg Oral Q6H Lord, Sharlot GRADE, NP       magnesium  hydroxide (MILK OF MAGNESIA) suspension 30 mL  30 mL Oral Daily PRN Jacquetta Sharlot GRADE, NP       OLANZapine  (ZYPREXA ) tablet 10 mg  10 mg Oral QHS Alexsandria Kivett, MD   10 mg at 06/05/24 2117   Or   OLANZapine  (ZYPREXA ) injection 10 mg  10 mg Intramuscular QHS Elcie Pelster, MD       OLANZapine  (ZYPREXA ) injection 5 mg  5 mg Intramuscular TID PRN Jacquetta Sharlot GRADE, NP       prenatal multivitamin tablet 1 tablet  1 tablet Oral Q1200 Jacquetta Sharlot GRADE, NP       senna-docusate (Senokot-S) tablet 2 tablet  2 tablet Oral Daily Jacquetta Sharlot GRADE, NP  simethicone  (MYLICON) chewable tablet 80 mg  80 mg Oral PRN Lord, Jamison Y, NP       zolpidem  (AMBIEN ) tablet 5 mg  5 mg Oral QHS PRN Lord, Jamison Y, NP   5 mg at 06/05/24 2117    Lab Results: No results found for this or any previous visit (from the past 48 hours).  Blood Alcohol level:  Lab  Results  Component Value Date   [facility] <15 11/29/2023   ETH <10 10/11/2017    Metabolic Disorder Labs: Lab Results  Component Value Date   HGBA1C 5.0 05/29/2024   MPG 96.8 05/29/2024   MPG 91.06 10/18/2017   Lab Results  Component Value Date   PROLACTIN 166.7 (H) 10/18/2017   Lab Results  Component Value Date   CHOL 311 (H) 05/29/2024   TRIG 196 (H) 05/29/2024   HDL 55 05/29/2024   CHOLHDL 5.7 05/29/2024   VLDL 39 05/29/2024   LDLCALC 217 (H) 05/29/2024   LDLCALC 102 (H) 10/18/2017    Physical Findings: AIMS:  , ,  ,  ,    CIWA:    COWS:      Psychiatric Specialty Exam:  Presentation  General Appearance:  Appropriate for Environment; Casual  Eye Contact: Fair  Speech: Clear and Coherent  Speech Volume: Normal    Mood and Affect  Mood: Irritable  Affect: Blunt   Thought Process  Thought Processes: Disorganized Improving Orientation:Full (Time, Place and Person)  Thought Content:Delusions; Tangential Resolving Hallucinations: Denies  Ideas of Reference:Delusions  Suicidal Thoughts DeniesHomicidal Thoughts: Denies   Sensorium  Memory: Immediate Poor  Judgment: Impaired  Insight: Lacking   Executive Functions  Concentration: Fair  Attention Span: Fair  Recall: Poor  Fund of Knowledge: Fair  Language: Fair   Psychomotor Activity  Psychomotor Activity: No data recorded  Musculoskeletal: Strength & Muscle Tone: within normal limits Gait & Station: normal Assets  Assets: Housing; Social Support    Physical Exam: Physical Exam Vitals and nursing note reviewed.  Constitutional:      Appearance: Normal appearance.  Pulmonary:     Effort: Pulmonary effort is normal.  Neurological:     Mental Status: She is alert.    Review of Systems  Respiratory:  Negative for shortness of breath.   Cardiovascular:  Negative for chest pain.  Gastrointestinal:  Negative for diarrhea, nausea and vomiting.   Psychiatric/Behavioral:  Negative for substance abuse and suicidal ideas.   All other systems reviewed and are negative.  Blood pressure 102/71, pulse 93, temperature 98.4 F (36.9 C), temperature source Oral, resp. rate 13, last menstrual period 08/04/2023, SpO2 99%, unknown if currently breastfeeding. There is no height or weight on file to calculate BMI.  Diagnosis: Principal Problem:   Schizophrenia, undifferentiated (HCC)   PLAN: Safety and Monitoring:  -- Involuntary admission to inpatient psychiatric unit for safety, stabilization and treatment  -- Daily contact with patient to assess and evaluate symptoms and progress in treatment  -- Patient's case to be discussed in multi-disciplinary team meeting  -- Observation Level : q15 minute checks  -- Vital signs:  q12 hours  -- Precautions: suicide, elopement, and assault -- Encouraged patient to participate in unit milieu and in scheduled group therapies   2. Psychiatric Treatment:  Scheduled Medications: Zyprexa  increased to 10 mg nightly PO or IM - Forced medication order as per Dr. Ruther  Ambien  PRN sleep    -- The risks/benefits/side-effects/alternatives to this medication were discussed in detail with the patient and time  was given for questions. The patient consents to medication trial.   3. Medical Issues Being Addressed:  Post Partum - patient declining vitamins/medications No medical concerns expressed  4. Discharge Planning:   -- Social work and case management to assist with discharge planning and identification of hospital follow-up needs prior to discharge  -- Estimated LOS: 5-7 days  Mamie Diiorio, MD 06/06/2024, 2:01 PM

## 2024-06-06 NOTE — Group Note (Signed)
 [facility] LCSW Group Therapy Note   Group Date: 06/06/2024 Start Time: 1246 End Time: 1405   Type of Therapy/Topic:  Group Therapy:  Emotion Regulation  Participation Level:  Did Not Attend   Mood:  Description of Group:    The purpose of this group is to assist patients in learning to regulate negative emotions and experience positive emotions. Patients will be guided to discuss ways in which they have been vulnerable to their negative emotions. These vulnerabilities will be juxtaposed with experiences of positive emotions or situations, and patients challenged to use positive emotions to combat negative ones. Special emphasis will be placed on coping with negative emotions in conflict situations, and patients will process healthy conflict resolution skills.  Therapeutic Goals: Patient will identify two positive emotions or experiences to reflect on in order to balance out negative emotions:  Patient will label two or more emotions that they find the most difficult to experience:  Patient will be able to demonstrate positive conflict resolution skills through discussion or role plays:   Summary of Patient Progress:   Patient did not attend.     Therapeutic Modalities:   Cognitive Behavioral Therapy Feelings Identification Dialectical Behavioral Therapy   Amy CHRISTELLA Kerns, LCSW

## 2024-06-06 NOTE — Progress Notes (Signed)
" °   06/06/24 0019  Psych Admission Type (Psych Patients Only)  Admission Status Involuntary  Psychosocial Assessment  Patient Complaints None  Eye Contact Fair  Facial Expression Flat  Affect Preoccupied  Speech Logical/coherent  Interaction Guarded  Motor Activity Restless  Appearance/Hygiene Unremarkable  Behavior Characteristics Irritable  Mood Preoccupied  Aggressive Behavior  Effect No apparent injury  Thought Process  Coherency WDL  Content Preoccupation  Delusions None reported or observed  Perception WDL  Hallucination None reported or observed  Judgment Impaired  Confusion Mild  Danger to Self  Current suicidal ideation? Denies  Agreement Not to Harm Self Yes  Danger to Others  Danger to Others None reported or observed    "

## 2024-06-06 NOTE — Plan of Care (Signed)
   Problem: Education: Goal: Knowledge of [facility] Education information/materials will improve Outcome: Progressing Goal: Emotional status will improve Outcome: Progressing Goal: Mental status will improve Outcome: Progressing Goal: Verbalization of understanding the information provided will improve Outcome: Progressing   Problem: Activity: Goal: Interest or engagement in activities will improve Outcome: Progressing Goal: Sleeping patterns will improve Outcome: Progressing   Problem: Coping: Goal: Ability to verbalize frustrations and anger appropriately will improve Outcome: Progressing Goal: Ability to demonstrate self-control will improve Outcome: Progressing   Problem: Health Behavior/Discharge Planning: Goal: Identification of resources available to assist in meeting health care needs will improve Outcome: Progressing Goal: Compliance with treatment plan for underlying cause of condition will improve Outcome: Progressing   Problem: Physical Regulation: Goal: Ability to maintain clinical measurements within normal limits will improve Outcome: Progressing   Problem: Safety: Goal: Periods of time without injury will increase Outcome: Progressing   Problem: Education: Goal: Knowledge of General Education information will improve Description: Including pain rating scale, medication(s)/side effects and non-pharmacologic comfort measures Outcome: Progressing   Problem: Health Behavior/Discharge Planning: Goal: Ability to manage health-related needs will improve Outcome: Progressing   Problem: Clinical Measurements: Goal: Ability to maintain clinical measurements within normal limits will improve Outcome: Progressing Goal: Will remain free from infection Outcome: Progressing Goal: Diagnostic test results will improve Outcome: Progressing Goal: Respiratory complications will improve Outcome: Progressing Goal: Cardiovascular complication will be  avoided Outcome: Progressing   Problem: Activity: Goal: Risk for activity intolerance will decrease Outcome: Progressing   Problem: Nutrition: Goal: Adequate nutrition will be maintained Outcome: Progressing   Problem: Coping: Goal: Level of anxiety will decrease Outcome: Progressing   Problem: Elimination: Goal: Will not experience complications related to bowel motility Outcome: Progressing Goal: Will not experience complications related to urinary retention Outcome: Progressing   Problem: Pain Managment: Goal: General experience of comfort will improve and/or be controlled Outcome: Progressing   Problem: Safety: Goal: Ability to remain free from injury will improve Outcome: Progressing   Problem: Skin Integrity: Goal: Risk for impaired skin integrity will decrease Outcome: Progressing

## 2024-06-06 NOTE — Group Note (Signed)
 Date:  06/06/2024 Time:  8:39 PM  Group Topic/Focus:  Wrap-Up Group:   The focus of this group is to help patients review their daily goal of treatment and discuss progress on daily workbooks.    Participation Level:  Did Not Attend  Participation Quality:  none  Affect:  none  Cognitive:  none  Insight: None  Engagement in Group:  none  Modes of Intervention:  none  Additional Comments:    Ginny JONETTA Galeazzi 06/06/2024, 8:39 PM

## 2024-06-07 NOTE — Group Note (Signed)
 [facility] LCSW Group Therapy Note   Group Date: 06/07/2024 Start Time: 1300 End Time: 1400   Type of Therapy/Topic:  Group Therapy:  Balance in Life  Participation Level:  Did Not Attend   Description of Group:    This group will address the concept of balance and how it feels and looks when one is unbalanced. Patients will be encouraged to process areas in their lives that are out of balance, and identify reasons for remaining unbalanced. Facilitators will guide patients utilizing problem- solving interventions to address and correct the stressor making their life unbalanced. Understanding and applying boundaries will be explored and addressed for obtaining  and maintaining a balanced life. Patients will be encouraged to explore ways to assertively make their unbalanced needs known to significant others in their lives, using other group members and facilitator for support and feedback.  Therapeutic Goals: Patient will identify two or more emotions or situations they have that consume much of in their lives. Patient will identify signs/triggers that life has become out of balance:  Patient will identify two ways to set boundaries in order to achieve balance in their lives:  Patient will demonstrate ability to communicate their needs through discussion and/or role plays  Summary of Patient Progress:    X    Therapeutic Modalities:   Cognitive Behavioral Therapy Solution-Focused Therapy Assertiveness Training   Sherryle JINNY Margo, LCSW

## 2024-06-07 NOTE — Group Note (Signed)
 Recreation Therapy Group Note   Group Topic:Leisure Education  Group Date: 06/07/2024 Start Time: 1000 End Time: 1110 Facilitators: Celestia Jeoffrey BRAVO, LRT, CTRS Location: Craft Room  Group Description: Leisure. Patients were given the option to choose from journaling, coloring, drawing, making origami, playing with playdoh, listening to music or singing karaoke. LRT and pts discussed the meaning of leisure, the importance of participating in leisure during their free time/when they're outside of the hospital, as well as how our leisure interests can also serve as coping skills.   Goal Area(s) Addressed:  Patient will identify a current leisure interest.  Patient will learn the definition of leisure. Patient will practice making a positive decision. Patient will have the opportunity to try a new leisure activity. Patient will communicate with peers and LRT.    Affect/Mood: N/A   Participation Level: Did not attend    Clinical Observations/Individualized Feedback: Patient did not attend.  Plan: Continue to engage patient in RT group sessions 2-3x/week.   Jeoffrey BRAVO Celestia, LRT, CTRS 06/07/2024 11:17 AM

## 2024-06-07 NOTE — Group Note (Signed)
 Recreation Therapy Group Note   Group Topic:Health and Wellness  Group Date: 06/07/2024 Start Time: 1525 End Time: 1605 Facilitators: Celestia Jeoffrey BRAVO, LRT, CTRS Location: Courtyard  Group Description: [employer]. LRT and patients played games of basketball, drew with chalk, and played corn hole while outside in the courtyard while getting fresh air and sunlight. Music was being played in the background. LRT and peers conversed about different games they have played before, what they do in their free time and anything else that is on their minds. LRT encouraged pts to drink water  after being outside, sweating and getting their heart rate up.  Goal Area(s) Addressed: Patient will build on frustration tolerance skills. Patients will partake in a competitive play game with peers. Patients will gain knowledge of new leisure interest/hobby.    Affect/Mood: N/A   Participation Level: Did not attend    Clinical Observations/Individualized Feedback: Patient did not attend.  Plan: Continue to engage patient in RT group sessions 2-3x/week.   Jeoffrey BRAVO Celestia, LRT, CTRS 06/07/2024 4:54 PM

## 2024-06-07 NOTE — Progress Notes (Signed)
" °   06/06/24 2012  Psych Admission Type (Psych Patients Only)  Admission Status Involuntary  Psychosocial Assessment  Patient Complaints None  Eye Contact Fair  Facial Expression Other (Comment) (WDL)  Affect Preoccupied  Speech Logical/coherent  Interaction Seclusive;Guarded;Isolative  Motor Activity Other (Comment) (WDL)  Appearance/Hygiene Unremarkable  Behavior Characteristics Cooperative  Mood Preoccupied;Apprehensive (about taking medication)  Aggressive Behavior  Effect No apparent injury  Thought Process  Coherency Concrete thinking  Content Preoccupation  Delusions None reported or observed  Perception WDL  Hallucination None reported or observed  Judgment Impaired  Confusion None  Danger to Self  Current suicidal ideation? Denies  Agreement Not to Harm Self Yes  Description of Agreement Verbal  Danger to Others  Danger to Others None reported or observed    "

## 2024-06-07 NOTE — Plan of Care (Signed)
   Problem: Education: Goal: Knowledge of Amy Mcclain General Education information/materials will improve Outcome: Progressing Goal: Emotional status will improve Outcome: Progressing Goal: Mental status will improve Outcome: Progressing Goal: Verbalization of understanding the information provided will improve Outcome: Progressing   Problem: Activity: Goal: Interest or engagement in activities will improve Outcome: Progressing Goal: Sleeping patterns will improve Outcome: Progressing   Problem: Coping: Goal: Ability to verbalize frustrations and anger appropriately will improve Outcome: Progressing Goal: Ability to demonstrate self-control will improve Outcome: Progressing

## 2024-06-07 NOTE — BH IP Treatment Plan (Signed)
 Interdisciplinary Treatment and Diagnostic Plan Update  06/07/2024 Time of Session: 8:00AM Katrin Grabel MRN: [id number]  Principal Diagnosis: Schizophrenia, undifferentiated (HCC)  Secondary Diagnoses: Principal Problem:   Schizophrenia, undifferentiated (HCC)   Current Medications:  Current Facility-Administered Medications  Medication Dose Route Frequency Provider Last Rate Last Admin   acetaminophen  (TYLENOL ) tablet 650 mg  650 mg Oral Q4H PRN Lord, Jamison Y, NP       alum & mag hydroxide-simeth (MAALOX/MYLANTA) 200-200-20 MG/5ML suspension 30 mL  30 mL Oral Q4H PRN Lord, Jamison Y, NP       benzocaine -Menthol  (DERMOPLAST) 20-0.5 % topical spray 1 Application  1 Application Topical PRN Lord, Jamison Y, NP       coconut oil  1 Application Topical PRN Jacquetta Sharlot GRADE, NP       witch hazel-glycerin  (TUCKS) pad 1 Application  1 Application Topical PRN Jacquetta Sharlot GRADE, NP       And   dibucaine (NUPERCAINAL) 1 % rectal ointment 1 Application  1 Application Rectal PRN Jacquetta Sharlot GRADE, NP       diphenhydrAMINE  (BENADRYL ) capsule 25 mg  25 mg Oral Q6H PRN Lord, Jamison Y, NP       ferrous sulfate  tablet 325 mg  325 mg Oral Q1400 Jacquetta Sharlot GRADE, NP       ibuprofen  (ADVIL ) tablet 600 mg  600 mg Oral Q6H Jacquetta Sharlot GRADE, NP   600 mg at 06/07/24 9396   magnesium  hydroxide (MILK OF MAGNESIA) suspension 30 mL  30 mL Oral Daily PRN Lord, Jamison Y, NP       OLANZapine  (ZYPREXA ) tablet 10 mg  10 mg Oral QHS Jadapalle, Sree, MD   10 mg at 06/06/24 2107   Or   OLANZapine  (ZYPREXA ) injection 10 mg  10 mg Intramuscular QHS Jadapalle, Sree, MD       OLANZapine  (ZYPREXA ) injection 5 mg  5 mg Intramuscular TID PRN Jacquetta Sharlot GRADE, NP       prenatal multivitamin tablet 1 tablet  1 tablet Oral Q1200 Jacquetta Sharlot GRADE, NP       senna-docusate (Senokot-S) tablet 2 tablet  2 tablet Oral Daily Jacquetta Sharlot GRADE, NP       simethicone  (MYLICON) chewable tablet 80 mg  80 mg Oral PRN Jacquetta Sharlot GRADE, NP       zolpidem   (AMBIEN ) tablet 5 mg  5 mg Oral QHS PRN Lord, Jamison Y, NP   5 mg at 06/06/24 2106   PTA Medications: Medications Prior to Admission  Medication Sig Dispense Refill Last Dose/Taking   ibuprofen  (ADVIL ) 600 MG tablet Take 1 tablet (600 mg total) by mouth every 6 (six) hours. 30 tablet 0 Unknown   OLANZapine  (ZYPREXA ) 5 MG tablet Take 1 tablet (5 mg total) by mouth at bedtime. 30 tablet 1 Taking   Prenatal Vit-Fe Fumarate-FA (PRENATAL MULTIVITAMIN) TABS tablet Take 1 tablet by mouth daily at 12 noon. 30 tablet 5 Unknown   lurasidone (LATUDA) 20 MG TABS tablet Take 20 mg by mouth at bedtime. (Patient not taking: Reported on 05/31/2024)   Not Taking    Patient Stressors:    Patient Strengths:    Treatment Modalities: Medication Management, Group therapy, Case management,  1 to 1 session with clinician, Psychoeducation, Recreational therapy.   Physician Treatment Plan for Primary Diagnosis: Schizophrenia, undifferentiated (HCC) Long Term Goal(s):     Short Term Goals:    Medication Management: Evaluate patient's response, side effects, and tolerance of medication regimen.  Therapeutic Interventions: 1  to 1 sessions, Unit Group sessions and Medication administration.  Evaluation of Outcomes: Not Progressing  Physician Treatment Plan for Secondary Diagnosis: Principal Problem:   Schizophrenia, undifferentiated (HCC)  Long Term Goal(s):     Short Term Goals:       Medication Management: Evaluate patient's response, side effects, and tolerance of medication regimen.  Therapeutic Interventions: 1 to 1 sessions, Unit Group sessions and Medication administration.  Evaluation of Outcomes: Not Progressing   RN Treatment Plan for Primary Diagnosis: Schizophrenia, undifferentiated (HCC) Long Term Goal(s): Knowledge of disease and therapeutic regimen to maintain health will improve  Short Term Goals: Ability to demonstrate self-control, Ability to participate in decision making will  improve, Ability to verbalize feelings will improve, Ability to disclose and discuss suicidal ideas, Ability to identify and develop effective coping behaviors will improve, and Compliance with prescribed medications will improve  Medication Management: RN will administer medications as ordered by provider, will assess and evaluate patient's response and provide education to patient for prescribed medication. RN will report any adverse and/or side effects to prescribing provider.  Therapeutic Interventions: 1 on 1 counseling sessions, Psychoeducation, Medication administration, Evaluate responses to treatment, Monitor vital signs and CBGs as ordered, Perform/monitor CIWA, COWS, AIMS and Fall Risk screenings as ordered, Perform wound care treatments as ordered.  Evaluation of Outcomes: Not Progressing   LCSW Treatment Plan for Primary Diagnosis: Schizophrenia, undifferentiated (HCC) Long Term Goal(s): Safe transition to appropriate next level of care at discharge, Engage patient in therapeutic group addressing interpersonal concerns.  Short Term Goals: Engage patient in aftercare planning with referrals and resources, Increase social support, Increase ability to appropriately verbalize feelings, Increase emotional regulation, Facilitate acceptance of mental health diagnosis and concerns, and Increase skills for wellness and recovery  Therapeutic Interventions: Assess for all discharge needs, 1 to 1 time with Social worker, Explore available resources and support systems, Assess for adequacy in community support network, Educate family and significant other(s) on suicide prevention, Complete Psychosocial Assessment, Interpersonal group therapy.  Evaluation of Outcomes: Not Progressing   Progress in Treatment: Attending groups: No. Participating in groups: No. Taking medication as prescribed: Yes. Toleration medication: Yes. Family/Significant other contact made: Yes, individual(s) contacted:   SPE completed with the patient's husband Patient understands diagnosis: No. Discussing patient identified problems/goals with staff: No. Medical problems stabilized or resolved: Yes. Denies suicidal/homicidal ideation: Yes. Issues/concerns per patient self-inventory: No. Other: none  New problem(s) identified: No, Describe:  none  Update 06/07/2024:  No changes at this time.    New Short Term/Long Term Goal(s): detox, elimination of symptoms of psychosis, medication management for mood stabilization; elimination of SI thoughts; development of comprehensive mental wellness/sobriety plan.  Update 06/07/2024:  No changes at this time.    Patient Goals:  No goals.  I just had a baby.Update 06/07/2024:  No changes at this time.    Discharge Plan or Barriers: CSW to assist with development of appropriate discharge plans. Update 06/07/2024:  Patients husband reports that he would like for the patient to return home once she is taking her injection and has allowed CSW team to schedule a follow up pysch appointment.  Patient is declining to take her medication and declining to allow CSW to assist in getting appointment scheduled.     Reason for Continuation of Hospitalization: Aggression Anxiety Depression Medication stabilization Suicidal ideation   Estimated Length of Stay: 1-7 days Update 06/07/2024:  TBD    Last 3 [place] Suicide Severity Risk Score: Flowsheet Row Admission (Current) from  05/31/2024 in [facility] IN[facility]ICINE ED from 11/29/2023 in [facility] Clinical Support from 01/27/2023 in [facility]  C-SSRS RISK CATEGORY No Risk No Risk No Risk    Last PHQ 2/9 Scores:    01/27/2023    4:36 PM 11/17/2022    3:18 PM 08/27/2022    3:43 PM  Depression screen PHQ 2/9  Decreased Interest 0 0 1  Down, Depressed, Hopeless 0 0 0  PHQ - 2 Score 0 0 1    Scribe for Treatment Team: Sherryle JINNY Margo,  KEN 06/07/2024 8:18 AM

## 2024-06-07 NOTE — Progress Notes (Signed)
" °   06/07/24 1948  Psych Admission Type (Psych Patients Only)  Admission Status Involuntary  Psychosocial Assessment  Patient Complaints None  Eye Contact Fair  Facial Expression Flat  Affect Preoccupied  Speech Logical/coherent  Interaction Seclusive;Guarded  Motor Activity Other (Comment) (wdl)  Appearance/Hygiene Unremarkable  Behavior Characteristics Cooperative;Appropriate to situation  Mood Preoccupied  Aggressive Behavior  Effect No apparent injury  Thought Process  Coherency Concrete thinking  Content Preoccupation  Delusions None reported or observed  Perception WDL  Hallucination None reported or observed  Judgment Impaired  Confusion None  Danger to Self  Current suicidal ideation? Denies  Agreement Not to Harm Self Yes  Description of Agreement verbal  Danger to Others  Danger to Others None reported or observed    "

## 2024-06-07 NOTE — Plan of Care (Signed)
" °  Problem: Education: Goal: Emotional status will improve Outcome: Progressing   Problem: Safety: Goal: Periods of time without injury will increase Outcome: Progressing   Problem: Safety: Goal: Ability to remain free from injury will improve Outcome: Progressing   "

## 2024-06-07 NOTE — Progress Notes (Signed)
 [facility] MD Progress Note  06/07/2024 12:31 PM Amy Mcclain  MRN:  [id number]   Subjective:  Chart reviewed, case discussed in multidisciplinary meeting, patient seen during rounds.   06/07/24: Patient is noted to be in the room with the translator.  Unfortunately the translator was not translating but summarizing the patient's statements and asking the provider questions about her treatment rather than translation.  Patient continues to display lack of insight into her mental health problems.  She starts blaming the medication saying she is receiving medications for constipation when she does not need it and reports that the medication she is taking is giving her side effects of breast tenderness and dry nose.  Patient is taking Zyprexa  but continues to state that she does not need any medications.  Provider explained multiple times that her family including her husband and her 21 year old daughter expressed significant concerns to the team about patient being out at baseline and patient holding a long history of refusing all treatment and stopping medications when she gets discharged to home.  Patient denies SI/HI and hallucinations.  Patient displays disorganized thought processes.   06/06/24: Patient is interviewed today in the presence of interpreter.  Patient denies SI/HI/hallucinations.  When provider tried to discuss long-acting injectable that she was taking in the past patient brushed it off saying she does not have any diagnosis of schizophrenia and that her doctor told her she does not need any shots anymore.  Later on she says she will not take any LAI's until 6 months after her discharge.  Patient lacks insight into her mental health problems.  Patient is taking the oral Zyprexa .  She denies any side effects to the medications.   1/6: Interpreter was used face-to-face during this interview.  Patient continues to minimize her mental health presentation, remains discharge focused stating her baby is  waiting for her and patient wants to breast-feed the baby.  She is unable to acknowledge the events that led up to the hospitalization.  She denies SI/HI/plan and denies hallucinations.  Social work team and this provider reached out to patient's husband through interpreter and patient husband continues to express his concern that patient is still displaying paranoia and disorganized thought process and is not at her baseline.  Has been informed the team that patient was getting LAI but is unable to recall the name of the medication..  He agreed to provide contact details of patient's psychiatrist outpatient and the name of the medication so that patient can be restarted on it.  Family is not comfortable with patient discharge at this time.  1/5: Patient is seen today with a virtual interpreter.  Patient speaks Arabic.  When asked about the reasons for her hospitalization patient is able to respond that she went to her hospital to have a baby and baby went home but patient was brought to the psychiatric hospital.  She reports living with husband and her children.  She denies any abuse or any stressors at home.  She remains discharge focused and wants to go home stating that the baby is on formula now but she wants to go breast-feed the baby.  She denies feeling depressed or sad.  She reports that she is just waiting for the time when her family visits her.  She reports fair appetite and sleep.  Per treatment team patient is responding to Zyprexa  but still has residual disorganized thought process at times.  Patient wanted to know the medication name that she is taking.  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  1/4: Patient seen in the presence of an interpreter. She reports that she is feeling normal and that she doesn't want to take medication. She reports the medication given last night made her sleep too long. When  asked how long she slept, she reports she slept from 9 PM to 3 AM. She then reports that her normal sleeping pattern is like a normal person  from around 9 PM to 8  AM. Discussed that she has the option of taking oral medication as opposed to the shot if she felt like the shot made her sleep too long (Although it was brought to her attention that sleeping 9 PM to 3 AM was six hours). She then reported to writer that the injection last night was enough and she doesn't need anymore. She would like to go home and she can come back and follow up with psychiatry if needed in the future. Discussed that that is not currently an option as she is under involuntary commitment and she reported that doctors told her she was fine and was no longer required to stay (Of which is not accurate). She denies SI, HI, AVH. She also denies ever having any psychiatric diagnoses. She then ends the assessment and gets on the phone to call her husband. She was not observed to be upset on the phone with her husband, but was able to return to her room and self regulate.  Patient continues to show delusional thinking. Her mood is irritable and her affect is blunt. She continues to deny medications and deny a history of a psychiatric disorder. Due to ongoing symptoms of psychosis, the patient continues to meet criteria for involuntary commitment.  1/3: Patient seen in the presence of an interpreter.   Initially, she reported that she is done with the doctors. She then was OK speaking to clinical research associate. She reports she has taken all of the medication that she needs and needs no more. She reports that the doctor told her she does not have to take medication. She is unable to identify which doctor this was.    Although being told several times that patient would be staying in the hospital she continue to state that she is being discharged with her husband. At one point stating that she has already been discharged. She denies SI, HI, and AVH. Her  thought process continues to contain delusions and is disorganized.   When discussing potential for forced medication, patient did not comprehend the information that was being told to her. Attempted to discuss taking oral medication and patient continue to decline. She continues to state that she does not need to see psychiatry.   MD spoke with family. Family reported that patient has been off of her medication for some time. They report she does much better when she is on medication and that this is not her baseline. They would like her to get restarted back on medication. Please review MD note discussion forced medication and family discussion. Family confirmed to nursing that patient is not breastfeeding and that the infant is taking formula.   Past Psychiatric History: see h&P Family History:  Family History  Problem Relation Age of Onset   Hypertension Mother    Diabetes Mother    Diabetes Father    Hypertension Father    Social History:  Social History   Substance and Sexual Activity  Alcohol Use  No   Alcohol/week: 0.0 standard drinks of alcohol     Social History   Substance and Sexual Activity  Drug Use No    Social History   Socioeconomic History   Marital status: Married    Spouse name: Not on file   Number of children: Not on file   Years of education: Not on file   Highest education level: Not on file  Occupational History   Not on file  Tobacco Use   Smoking status: Never   Smokeless tobacco: Never  Vaping Use   Vaping status: Never Used  Substance and Sexual Activity   Alcohol use: No    Alcohol/week: 0.0 standard drinks of alcohol   Drug use: No   Sexual activity: Yes  Other Topics Concern   Not on file  Social History Narrative   Not on file   Social Drivers of Health   Tobacco Use: Low Risk (05/31/2024)   Patient History    Smoking Tobacco Use: Never    Smokeless Tobacco Use: Never    Passive Exposure: Not on file  Financial Resource Strain:  Not on file  Food Insecurity: No Food Insecurity (05/31/2024)   Epic    Worried About Programme Researcher, Broadcasting/film/video in the Last Year: Never true    Ran Out of Food in the Last Year: Never true  Transportation Needs: No Transportation Needs (05/31/2024)   Epic    Lack of Transportation (Medical): No    Lack of Transportation (Non-Medical): No  Physical Activity: Not on file  Stress: Not on file  Social Connections: Not on file  Depression (PHQ2-9): Low Risk (01/27/2023)   Depression (PHQ2-9)    PHQ-2 Score: 0  Alcohol Screen: Low Risk (05/31/2024)   Alcohol Screen    Last Alcohol Screening Score (AUDIT): 0  Housing: Low Risk (05/31/2024)   Epic    Unable to Pay for Housing in the Last Year: No    Number of Times Moved in the Last Year: 0    Homeless in the Last Year: No  Utilities: Not At Risk (05/31/2024)   Epic    Threatened with loss of utilities: No  Health Literacy: Not on file   Past Medical History:  Past Medical History:  Diagnosis Date   Anemia    Depression    Schizophrenia (HCC)     Past Surgical History:  Procedure Laterality Date   NO PAST SURGERIES      Current Medications: Current Facility-Administered Medications  Medication Dose Route Frequency Provider Last Rate Last Admin   acetaminophen  (TYLENOL ) tablet 650 mg  650 mg Oral Q4H PRN Jacquetta Sharlot GRADE, NP       alum & mag hydroxide-simeth (MAALOX/MYLANTA) 200-200-20 MG/5ML suspension 30 mL  30 mL Oral Q4H PRN Lord, Jamison Y, NP       benzocaine -Menthol  (DERMOPLAST) 20-0.5 % topical spray 1 Application  1 Application Topical PRN Lord, Jamison Y, NP       coconut oil  1 Application Topical PRN Jacquetta Sharlot GRADE, NP       witch hazel-glycerin  (TUCKS) pad 1 Application  1 Application Topical PRN Jacquetta Sharlot GRADE, NP       And   dibucaine (NUPERCAINAL) 1 % rectal ointment 1 Application  1 Application Rectal PRN Jacquetta Sharlot GRADE, NP       diphenhydrAMINE  (BENADRYL ) capsule 25 mg  25 mg Oral Q6H PRN Lord, Jamison Y, NP       ferrous  sulfate tablet 325 mg  325 mg Oral Q1400 Jacquetta Sharlot GRADE, NP       ibuprofen  (ADVIL ) tablet 600 mg  600 mg Oral Q6H Jacquetta Sharlot GRADE, NP   600 mg at 06/07/24 9396   magnesium  hydroxide (MILK OF MAGNESIA) suspension 30 mL  30 mL Oral Daily PRN Lord, Jamison Y, NP       OLANZapine  (ZYPREXA ) tablet 10 mg  10 mg Oral QHS Yeison Sippel, MD   10 mg at 06/06/24 2107   Or   OLANZapine  (ZYPREXA ) injection 10 mg  10 mg Intramuscular QHS Nancee Brownrigg, MD       OLANZapine  (ZYPREXA ) injection 5 mg  5 mg Intramuscular TID PRN Lord, Jamison Y, NP       prenatal multivitamin tablet 1 tablet  1 tablet Oral Q1200 Jacquetta Sharlot GRADE, NP       senna-docusate (Senokot-S) tablet 2 tablet  2 tablet Oral Daily Jacquetta Sharlot GRADE, NP       simethicone  (MYLICON) chewable tablet 80 mg  80 mg Oral PRN Lord, Jamison Y, NP       zolpidem  (AMBIEN ) tablet 5 mg  5 mg Oral QHS PRN Lord, Jamison Y, NP   5 mg at 06/06/24 2106    Lab Results: No results found for this or any previous visit (from the past 48 hours).  Blood Alcohol level:  Lab Results  Component Value Date   [facility] <15 11/29/2023   ETH <10 10/11/2017    Metabolic Disorder Labs: Lab Results  Component Value Date   HGBA1C 5.0 05/29/2024   MPG 96.8 05/29/2024   MPG 91.06 10/18/2017   Lab Results  Component Value Date   PROLACTIN 166.7 (H) 10/18/2017   Lab Results  Component Value Date   CHOL 311 (H) 05/29/2024   TRIG 196 (H) 05/29/2024   HDL 55 05/29/2024   CHOLHDL 5.7 05/29/2024   VLDL 39 05/29/2024   LDLCALC 217 (H) 05/29/2024   LDLCALC 102 (H) 10/18/2017    Physical Findings: AIMS:  , ,  ,  ,    CIWA:    COWS:      Psychiatric Specialty Exam:  Presentation  General Appearance:  Appropriate for Environment; Casual  Eye Contact: Fair  Speech: Clear and Coherent  Speech Volume: Normal    Mood and Affect  Mood: Irritable  Affect: Blunt   Thought Process  Thought Processes: Disorganized Improving Orientation:Full (Time,  Place and Person)  Thought Content:Delusions; Tangential Resolving Hallucinations: Denies  Ideas of Reference:Delusions  Suicidal Thoughts DeniesHomicidal Thoughts: Denies   Sensorium  Memory: Immediate Poor  Judgment: Impaired  Insight: Lacking   Executive Functions  Concentration: Fair  Attention Span: Fair  Recall: Poor  Fund of Knowledge: Fair  Language: Fair   Psychomotor Activity  Psychomotor Activity: No data recorded  Musculoskeletal: Strength & Muscle Tone: within normal limits Gait & Station: normal Assets  Assets: Housing; Social Support    Physical Exam: Physical Exam Vitals and nursing note reviewed.  Constitutional:      Appearance: Normal appearance.  Pulmonary:     Effort: Pulmonary effort is normal.  Neurological:     Mental Status: She is alert.    Review of Systems  Respiratory:  Negative for shortness of breath.   Cardiovascular:  Negative for chest pain.  Gastrointestinal:  Negative for diarrhea, nausea and vomiting.  Psychiatric/Behavioral:  Negative for substance abuse and suicidal ideas.   All other systems reviewed and are negative.  Blood pressure (!) 129/94, pulse 90, temperature 97.6  F (36.4 C), temperature source Oral, resp. rate 16, height 4' 4 (1.321 m), weight 54 kg, last menstrual period 08/04/2023, SpO2 100%, unknown if currently breastfeeding. Body mass index is 30.94 kg/m.  Diagnosis: Principal Problem:   Schizophrenia, undifferentiated (HCC)   PLAN: Safety and Monitoring:  -- Involuntary admission to inpatient psychiatric unit for safety, stabilization and treatment  -- Daily contact with patient to assess and evaluate symptoms and progress in treatment  -- Patient's case to be discussed in multi-disciplinary team meeting  -- Observation Level : q15 minute checks  -- Vital signs:  q12 hours  -- Precautions: suicide, elopement, and assault -- Encouraged patient to participate in unit milieu and  in scheduled group therapies   2. Psychiatric Treatment:  Scheduled Medications: Zyprexa  increased to 10 mg nightly PO or IM - Forced medication order as per Dr. Ruther  Ambien  PRN sleep    -- The risks/benefits/side-effects/alternatives to this medication were discussed in detail with the patient and time was given for questions. The patient consents to medication trial.   3. Medical Issues Being Addressed:  Post Partum - patient declining vitamins/medications No medical concerns expressed  4. Discharge Planning:   -- Social work and case management to assist with discharge planning and identification of hospital follow-up needs prior to discharge  -- Estimated LOS: 5-7 days  Allyn Foil, MD 06/07/2024, 12:31 PM

## 2024-06-07 NOTE — Plan of Care (Signed)
   Problem: Education: Goal: Mental status will improve Outcome: Not Progressing

## 2024-06-07 NOTE — Group Note (Signed)
 Date:  06/07/2024 Time:  8:45 PM  Group Topic/Focus:  Dimensions of Wellness:   The focus of this group is to introduce the topic of wellness and discuss the role each dimension of wellness plays in total health. Self Care:   The focus of this group is to help patients understand the importance of self-care in order to improve or restore emotional, physical, spiritual, interpersonal, and financial health.    Pt did not attend group.  Willona Phariss L 06/07/2024, 8:45 PM

## 2024-06-08 NOTE — Group Note (Signed)
 Date:  06/08/2024 Time:  8:56 PM  Group Topic/Focus:  Conflict Resolution:   The focus of this group is to discuss the conflict resolution process and how it may be used upon discharge.    Pt did not attend group.  Amy Mcclain L 06/08/2024, 8:56 PM

## 2024-06-08 NOTE — Progress Notes (Signed)
 [facility] MD Progress Note  06/08/2024 1:45 PM Amy Mcclain  MRN:  [id number]   Subjective:  Chart reviewed, case discussed in multidisciplinary meeting, patient seen during rounds.   06/08/24: Patient was interviewed with face-to-face interpreter.  Patient continues to lack insight into her mental health problems.  Per nursing patient has declined to take any of her medication.  Provider explained the need for her to take psychotropic medications at least Zyprexa  to help with her mood.  Patient reports that she is not diagnosed with any mental health problems and she does not need to take any medications.  She denies SI/HI/plan and denies hallucinations but displays disorganized thought process.    06/07/24: Patient is noted to be in the room with the translator.  Unfortunately the translator was not translating but summarizing the patient's statements and asking the provider questions about her treatment rather than translation.  Patient continues to display lack of insight into her mental health problems.  She starts blaming the medication saying she is receiving medications for constipation when she does not need it and reports that the medication she is taking is giving her side effects of breast tenderness and dry nose.  Patient is taking Zyprexa  but continues to state that she does not need any medications.  Provider explained multiple times that her family including her husband and her 54 year old daughter expressed significant concerns to the team about patient being out at baseline and patient holding a long history of refusing all treatment and stopping medications when she gets discharged to home.  Patient denies SI/HI and hallucinations.  Patient displays disorganized thought processes.   06/06/24: Patient is interviewed today in the presence of interpreter.  Patient denies SI/HI/hallucinations.  When provider tried to discuss long-acting injectable that she was taking in the past patient brushed it off  saying she does not have any diagnosis of schizophrenia and that her doctor told her she does not need any shots anymore.  Later on she says she will not take any LAI's until 6 months after her discharge.  Patient lacks insight into her mental health problems.  Patient is taking the oral Zyprexa .  She denies any side effects to the medications.   1/6: Interpreter was used face-to-face during this interview.  Patient continues to minimize her mental health presentation, remains discharge focused stating her baby is waiting for her and patient wants to breast-feed the baby.  She is unable to acknowledge the events that led up to the hospitalization.  She denies SI/HI/plan and denies hallucinations.  Social work team and this provider reached out to patient's husband through interpreter and patient husband continues to express his concern that patient is still displaying paranoia and disorganized thought process and is not at her baseline.  Has been informed the team that patient was getting LAI but is unable to recall the name of the medication..  He agreed to provide contact details of patient's psychiatrist outpatient and the name of the medication so that patient can be restarted on it.  Family is not comfortable with patient discharge at this time.  1/5: Patient is seen today with a virtual interpreter.  Patient speaks Arabic.  When asked about the reasons for her hospitalization patient is able to respond that she went to her hospital to have a baby and baby went home but patient was brought to the psychiatric hospital.  She reports living with husband and her children.  She denies any abuse or any stressors at home.  She  remains discharge focused and wants to go home stating that the baby is on formula now but she wants to go breast-feed the baby.  She denies feeling depressed or sad.  She reports that she is just waiting for the time when her family visits her.  She reports fair appetite and sleep.  Per  treatment team patient is responding to Zyprexa  but still has residual disorganized thought process at times.  Patient wanted to know the medication name that she is taking.  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  1/4: Patient seen in the presence of an interpreter. She reports that she is feeling normal and that she doesn't want to take medication. She reports the medication given last night made her sleep too long. When asked how long she slept, she reports she slept from 9 PM to 3 AM. She then reports that her normal sleeping pattern is like a normal person  from around 9 PM to 8  AM. Discussed that she has the option of taking oral medication as opposed to the shot if she felt like the shot made her sleep too long (Although it was brought to her attention that sleeping 9 PM to 3 AM was six hours). She then reported to writer that the injection last night was enough and she doesn't need anymore. She would like to go home and she can come back and follow up with psychiatry if needed in the future. Discussed that that is not currently an option as she is under involuntary commitment and she reported that doctors told her she was fine and was no longer required to stay (Of which is not accurate). She denies SI, HI, AVH. She also denies ever having any psychiatric diagnoses. She then ends the assessment and gets on the phone to call her husband. She was not observed to be upset on the phone with her husband, but was able to return to her room and self regulate.  Patient continues to show delusional thinking. Her mood is irritable and her affect is blunt. She continues to deny medications and deny a history of a psychiatric disorder. Due to ongoing symptoms of psychosis, the patient continues to meet criteria for involuntary commitment.  1/3: Patient seen in the presence of an interpreter.   Initially, she reported  that she is done with the doctors. She then was OK speaking to clinical research associate. She reports she has taken all of the medication that she needs and needs no more. She reports that the doctor told her she does not have to take medication. She is unable to identify which doctor this was.    Although being told several times that patient would be staying in the hospital she continue to state that she is being discharged with her husband. At one point stating that she has already been discharged. She denies SI, HI, and AVH. Her thought process continues to contain delusions and is disorganized.   When discussing potential for forced medication, patient did not comprehend the information that was being told to her. Attempted to discuss taking oral medication and patient continue to decline. She continues to state that she does not need to see psychiatry.   MD spoke with family. Family reported that patient has been off of her medication for some time. They report she does much better when she is on medication and that this is not her baseline. They would like her  to get restarted back on medication. Please review MD note discussion forced medication and family discussion. Family confirmed to nursing that patient is not breastfeeding and that the infant is taking formula.   Past Psychiatric History: see h&P Family History:  Family History  Problem Relation Age of Onset   Hypertension Mother    Diabetes Mother    Diabetes Father    Hypertension Father    Social History:  Social History   Substance and Sexual Activity  Alcohol Use No   Alcohol/week: 0.0 standard drinks of alcohol     Social History   Substance and Sexual Activity  Drug Use No    Social History   Socioeconomic History   Marital status: Married    Spouse name: Not on file   Number of children: Not on file   Years of education: Not on file   Highest education level: Not on file  Occupational History   Not on file  Tobacco Use    Smoking status: Never   Smokeless tobacco: Never  Vaping Use   Vaping status: Never Used  Substance and Sexual Activity   Alcohol use: No    Alcohol/week: 0.0 standard drinks of alcohol   Drug use: No   Sexual activity: Yes  Other Topics Concern   Not on file  Social History Narrative   Not on file   Social Drivers of Health   Tobacco Use: Low Risk (05/31/2024)   Patient History    Smoking Tobacco Use: Never    Smokeless Tobacco Use: Never    Passive Exposure: Not on file  Financial Resource Strain: Not on file  Food Insecurity: No Food Insecurity (05/31/2024)   Epic    Worried About Programme Researcher, Broadcasting/film/video in the Last Year: Never true    Ran Out of Food in the Last Year: Never true  Transportation Needs: No Transportation Needs (05/31/2024)   Epic    Lack of Transportation (Medical): No    Lack of Transportation (Non-Medical): No  Physical Activity: Not on file  Stress: Not on file  Social Connections: Not on file  Depression (PHQ2-9): Low Risk (01/27/2023)   Depression (PHQ2-9)    PHQ-2 Score: 0  Alcohol Screen: Low Risk (05/31/2024)   Alcohol Screen    Last Alcohol Screening Score (AUDIT): 0  Housing: Low Risk (05/31/2024)   Epic    Unable to Pay for Housing in the Last Year: No    Number of Times Moved in the Last Year: 0    Homeless in the Last Year: No  Utilities: Not At Risk (05/31/2024)   Epic    Threatened with loss of utilities: No  Health Literacy: Not on file   Past Medical History:  Past Medical History:  Diagnosis Date   Anemia    Depression    Schizophrenia (HCC)     Past Surgical History:  Procedure Laterality Date   NO PAST SURGERIES      Current Medications: Current Facility-Administered Medications  Medication Dose Route Frequency Provider Last Rate Last Admin   acetaminophen  (TYLENOL ) tablet 650 mg  650 mg Oral Q4H PRN Jacquetta Sharlot GRADE, NP       alum & mag hydroxide-simeth (MAALOX/MYLANTA) 200-200-20 MG/5ML suspension 30 mL  30 mL Oral Q4H PRN Jacquetta Sharlot GRADE, NP       benzocaine -Menthol  (DERMOPLAST) 20-0.5 % topical spray 1 Application  1 Application Topical PRN Lord, Jamison Y, NP       coconut oil  1  Application Topical PRN Jacquetta Sharlot GRADE, NP       witch hazel-glycerin  (TUCKS) pad 1 Application  1 Application Topical PRN Jacquetta Sharlot GRADE, NP       And   dibucaine (NUPERCAINAL) 1 % rectal ointment 1 Application  1 Application Rectal PRN Jacquetta Sharlot GRADE, NP       diphenhydrAMINE  (BENADRYL ) capsule 25 mg  25 mg Oral Q6H PRN Lord, Jamison Y, NP       ferrous sulfate  tablet 325 mg  325 mg Oral Q1400 Jacquetta Sharlot GRADE, NP       ibuprofen  (ADVIL ) tablet 600 mg  600 mg Oral Q6H Jacquetta Sharlot GRADE, NP   600 mg at 06/07/24 9396   magnesium  hydroxide (MILK OF MAGNESIA) suspension 30 mL  30 mL Oral Daily PRN Lord, Jamison Y, NP       OLANZapine  (ZYPREXA ) tablet 10 mg  10 mg Oral QHS Keely Drennan, MD   10 mg at 06/07/24 2114   Or   OLANZapine  (ZYPREXA ) injection 10 mg  10 mg Intramuscular QHS Wilhelmenia Addis, MD       OLANZapine  (ZYPREXA ) injection 5 mg  5 mg Intramuscular TID PRN Jacquetta Sharlot GRADE, NP       prenatal multivitamin tablet 1 tablet  1 tablet Oral Q1200 Jacquetta Sharlot GRADE, NP       senna-docusate (Senokot-S) tablet 2 tablet  2 tablet Oral Daily Jacquetta Sharlot GRADE, NP       simethicone  (MYLICON) chewable tablet 80 mg  80 mg Oral PRN Lord, Jamison Y, NP       zolpidem  (AMBIEN ) tablet 5 mg  5 mg Oral QHS PRN Lord, Jamison Y, NP   5 mg at 06/07/24 2114    Lab Results: No results found for this or any previous visit (from the past 48 hours).  Blood Alcohol level:  Lab Results  Component Value Date   [facility] <15 11/29/2023   ETH <10 10/11/2017    Metabolic Disorder Labs: Lab Results  Component Value Date   HGBA1C 5.0 05/29/2024   MPG 96.8 05/29/2024   MPG 91.06 10/18/2017   Lab Results  Component Value Date   PROLACTIN 166.7 (H) 10/18/2017   Lab Results  Component Value Date   CHOL 311 (H) 05/29/2024   TRIG 196 (H) 05/29/2024   HDL 55  05/29/2024   CHOLHDL 5.7 05/29/2024   VLDL 39 05/29/2024   LDLCALC 217 (H) 05/29/2024   LDLCALC 102 (H) 10/18/2017    Physical Findings: AIMS:  , ,  ,  ,    CIWA:    COWS:      Psychiatric Specialty Exam:  Presentation  General Appearance:  Appropriate for Environment; Casual  Eye Contact: Fair  Speech: Clear and Coherent  Speech Volume: Normal    Mood and Affect  Mood: Irritable  Affect: Blunt   Thought Process  Thought Processes: Disorganized Improving Orientation:Full (Time, Place and Person)  Thought Content:Delusions; Tangential Resolving Hallucinations: Denies  Ideas of Reference:Delusions  Suicidal Thoughts DeniesHomicidal Thoughts: Denies   Sensorium  Memory: Immediate Poor  Judgment: Impaired  Insight: Lacking   Executive Functions  Concentration: Fair  Attention Span: Fair  Recall: Poor  Fund of Knowledge: Fair  Language: Fair   Psychomotor Activity  Psychomotor Activity: No data recorded  Musculoskeletal: Strength & Muscle Tone: within normal limits Gait & Station: normal Assets  Assets: Housing; Social Support    Physical Exam: Physical Exam Vitals and nursing note reviewed.  Constitutional:  Appearance: Normal appearance.  Pulmonary:     Effort: Pulmonary effort is normal.  Neurological:     Mental Status: She is alert.    Review of Systems  Respiratory:  Negative for shortness of breath.   Cardiovascular:  Negative for chest pain.  Gastrointestinal:  Negative for diarrhea, nausea and vomiting.  Psychiatric/Behavioral:  Negative for substance abuse and suicidal ideas.   All other systems reviewed and are negative.  Blood pressure 125/80, pulse (!) 105, temperature 97.6 F (36.4 C), temperature source Oral, resp. rate 20, height 4' 4 (1.321 m), weight 54 kg, last menstrual period 08/04/2023, SpO2 100%, unknown if currently breastfeeding. Body mass index is 30.94 kg/m.  Diagnosis:  Principal Problem:   Schizophrenia, undifferentiated (HCC)   PLAN: Safety and Monitoring:  -- Involuntary admission to inpatient psychiatric unit for safety, stabilization and treatment  -- Daily contact with patient to assess and evaluate symptoms and progress in treatment  -- Patient's case to be discussed in multi-disciplinary team meeting  -- Observation Level : q15 minute checks  -- Vital signs:  q12 hours  -- Precautions: suicide, elopement, and assault -- Encouraged patient to participate in unit milieu and in scheduled group therapies   2. Psychiatric Treatment:  Scheduled Medications: Zyprexa  increased to 10 mg nightly PO or IM - Forced medication order as per Dr. Ruther  Ambien  PRN sleep    -- The risks/benefits/side-effects/alternatives to this medication were discussed in detail with the patient and time was given for questions. The patient consents to medication trial.   3. Medical Issues Being Addressed:  Post Partum - patient declining vitamins/medications No medical concerns expressed  4. Discharge Planning:   -- Social work and case management to assist with discharge planning and identification of hospital follow-up needs prior to discharge  -- Estimated LOS: 5-7 days  Chihiro Frey, MD 06/08/2024, 1:45 PM

## 2024-06-08 NOTE — Plan of Care (Signed)
" °  Problem: Education: Goal: Knowledge of [facility] Education information/materials will improve 06/08/2024 1920 by Ann Orlean LABOR, RN Outcome: Progressing 06/08/2024 1920 by Ann Orlean LABOR, RN Outcome: Progressing Goal: Emotional status will improve Outcome: Progressing Goal: Mental status will improve Outcome: Progressing Goal: Verbalization of understanding the information provided will improve Outcome: Progressing   Problem: Coping: Goal: Ability to verbalize frustrations and anger appropriately will improve Outcome: Progressing Goal: Ability to demonstrate self-control will improve Outcome: Progressing   "

## 2024-06-08 NOTE — B[facility] Counselor (Signed)
 CSW spoke with the patient with the use of the Arabic Interpreter, Sarra G.    CSW again discussed with the patient the family's request for assistance in scheduling the patient with her outpatient provider.    Patient agreed to allow CSW to establish appointment.    CSW called to schedule the patient's appointment, however, was informed that patient was not current with desired provider and has not been seen in over a year.  This may align with patient and family's reports that the patient had to stop taking her medication once she became pregnant and for months following.   Due to length of time in between appointments the patient is now considred a new patient and has to see a new proivder, Dr. Harl, and may not be able to follow up with desired provider, Lytle Bolster, again.  Sherryle Margo, MSW, LCSW 06/08/2024 2:49 PM

## 2024-06-08 NOTE — Plan of Care (Signed)
   Problem: Education: Goal: Knowledge of [place] [facility] Education information/materials will improve Outcome: Progressing

## 2024-06-08 NOTE — Progress Notes (Signed)
" °   06/08/24 1300  Psych Admission Type (Psych Patients Only)  Admission Status Involuntary  Psychosocial Assessment  Patient Complaints None  Eye Contact Fair  Facial Expression Flat  Affect Preoccupied  Speech Logical/coherent  Interaction Guarded;Isolative  Motor Activity Other (Comment)  Appearance/Hygiene Unremarkable  Behavior Characteristics Unwilling to participate;Anxious  Mood Preoccupied  Thought Process  Coherency Concrete thinking  Content Preoccupation  Delusions None reported or observed  Perception WDL  Hallucination None reported or observed    "

## 2024-06-08 NOTE — Group Note (Signed)
 Date:  06/08/2024 Time:  9:54 AM  Group Topic/Focus:  Goals Group:   The focus of this group is to help patients establish daily goals to achieve during treatment and discuss how the patient can incorporate goal setting into their daily lives to aide in recovery.    Participation Level:  Did Not Attend   Amy Mcclain June 06/08/2024, 9:54 AM

## 2024-06-09 NOTE — Group Note (Signed)
 Date:  06/09/2024 Time:  6:52 PM  Group Topic/Focus:  Wellness Toolbox:   The focus of this group is to discuss various aspects of wellness, balancing those aspects and exploring ways to increase the ability to experience wellness.  Patients will create a wellness toolbox for use upon discharge.    Participation Level:  Did Not Attend  [name] Amy Mcclain 06/09/2024, 6:52 PM

## 2024-06-09 NOTE — Group Note (Signed)
 Date:  06/09/2024 Time:  6:48 PM  Group Topic/Focus:  Conflict Resolution:   The focus of this group is to discuss the conflict resolution process and how it may be used upon discharge.    Participation Level:  Did Not Attend   Amy Mcclain 06/09/2024, 6:48 PM

## 2024-06-09 NOTE — Plan of Care (Signed)
 Amy Mcclain is a 39 y.o. female patient. No diagnosis found. Past Medical History:  Diagnosis Date   Anemia    Depression    Schizophrenia (HCC)    Current Facility-Administered Medications  Medication Dose Route Frequency Provider Last Rate Last Admin   acetaminophen  (TYLENOL ) tablet 650 mg  650 mg Oral Q4H PRN Lord, Jamison Y, NP       alum & mag hydroxide-simeth (MAALOX/MYLANTA) 200-200-20 MG/5ML suspension 30 mL  30 mL Oral Q4H PRN Lord, Jamison Y, NP       benzocaine -Menthol  (DERMOPLAST) 20-0.5 % topical spray 1 Application  1 Application Topical PRN Lord, Jamison Y, NP       coconut oil  1 Application Topical PRN Jacquetta Sharlot GRADE, NP       witch hazel-glycerin  (TUCKS) pad 1 Application  1 Application Topical PRN Jacquetta Sharlot GRADE, NP       And   dibucaine (NUPERCAINAL) 1 % rectal ointment 1 Application  1 Application Rectal PRN Jacquetta Sharlot GRADE, NP       diphenhydrAMINE  (BENADRYL ) capsule 25 mg  25 mg Oral Q6H PRN Jacquetta Sharlot GRADE, NP       ferrous sulfate  tablet 325 mg  325 mg Oral Q1400 Jacquetta Sharlot GRADE, NP       ibuprofen  (ADVIL ) tablet 600 mg  600 mg Oral Q6H Jacquetta Sharlot GRADE, NP   600 mg at 06/07/24 9396   magnesium  hydroxide (MILK OF MAGNESIA) suspension 30 mL  30 mL Oral Daily PRN Lord, Jamison Y, NP       OLANZapine  (ZYPREXA ) tablet 10 mg  10 mg Oral QHS Jadapalle, Sree, MD   10 mg at 06/08/24 2153   Or   OLANZapine  (ZYPREXA ) injection 10 mg  10 mg Intramuscular QHS Jadapalle, Sree, MD       OLANZapine  (ZYPREXA ) injection 5 mg  5 mg Intramuscular TID PRN Jacquetta Sharlot GRADE, NP       prenatal multivitamin tablet 1 tablet  1 tablet Oral Q1200 Jacquetta Sharlot GRADE, NP       senna-docusate (Senokot-S) tablet 2 tablet  2 tablet Oral Daily Jacquetta Sharlot GRADE, NP       simethicone  (MYLICON) chewable tablet 80 mg  80 mg Oral PRN Lord, Jamison Y, NP       zolpidem  (AMBIEN ) tablet 5 mg  5 mg Oral QHS PRN Jacquetta Sharlot GRADE, NP   5 mg at 06/07/24 2114   Allergies[1] Principal Problem:   Schizophrenia,  undifferentiated (HCC)  Blood pressure (!) 108/56, pulse 99, temperature 97.6 F (36.4 C), temperature source Oral, resp. rate 20, height 4' 4 (1.321 m), weight 54 kg, last menstrual period 08/04/2023, SpO2 99%, unknown if currently breastfeeding.  Subjective Objective Assessment & Plan  Alwyn Cordner B Cyris Maalouf 06/09/2024      [1]  Allergies Allergen Reactions   Ceftin [Cefuroxime Axetil] Rash    bruising

## 2024-06-09 NOTE — Group Note (Signed)
 Date:  06/09/2024 Time:  11:32 PM  Group Topic/Focus:  Personal Choices and Values:   The focus of this group is to help patients assess and explore the importance of values in their lives, how their values affect their decisions, how they express their values and what opposes their expression.    Pt did not attend group.  Ahmarion Saraceno L 06/09/2024, 11:32 PM

## 2024-06-09 NOTE — Progress Notes (Signed)
 Patient ID: Amy Mcclain, female   DOB: 1985-08-25, 39 y.o.   MRN: [id number] [facility] MD Progress Note  06/09/2024 9:13 AM Aisley Whan  MRN:  [id number]   Subjective:  Chart reviewed, case discussed in multidisciplinary meeting, patient seen during rounds.   06/09/2024: Patient is assessed today on the inpatient unit with face to face interpreter. Explains that she does not need medication that this hospital is providing her. Although she states that the medication that she was receiving from her outpatient provider was beneficial, she does not believe that she is receiving the same medication here. She continues to display disorganized thinking and that the chemicals are not good for her. Patient continues to keep a bandage that was placed after she received an injection, stating that it is not good for her. She is discharge focused that she needs to return home to her infant that he receiving formula although family has confirmed that she is not breastfeeding the child. She denies all psychiatric symptoms at this time.   06/08/24: Patient was interviewed with face-to-face interpreter.  Patient continues to lack insight into her mental health problems.  Per nursing patient has declined to take any of her medication.  Provider explained the need for her to take psychotropic medications at least Zyprexa  to help with her mood.  Patient reports that she is not diagnosed with any mental health problems and she does not need to take any medications.  She denies SI/HI/plan and denies hallucinations but displays disorganized thought process.     06/07/24: Patient is noted to be in the room with the translator.  Unfortunately the translator was not translating but summarizing the patient's statements and asking the provider questions about her treatment rather than translation.  Patient continues to display lack of insight into her mental health problems.  She starts blaming the medication saying she is receiving medications  for constipation when she does not need it and reports that the medication she is taking is giving her side effects of breast tenderness and dry nose.  Patient is taking Zyprexa  but continues to state that she does not need any medications.  Provider explained multiple times that her family including her husband and her 77 year old daughter expressed significant concerns to the team about patient being out at baseline and patient holding a long history of refusing all treatment and stopping medications when she gets discharged to home.  Patient denies SI/HI and hallucinations.  Patient displays disorganized thought processes.     06/06/24: Patient is interviewed today in the presence of interpreter.  Patient denies SI/HI/hallucinations.  When provider tried to discuss long-acting injectable that she was taking in the past patient brushed it off saying she does not have any diagnosis of schizophrenia and that her doctor told her she does not need any shots anymore.  Later on she says she will not take any LAI's until 6 months after her discharge.  Patient lacks insight into her mental health problems.  Patient is taking the oral Zyprexa .  She denies any side effects to the medications.     1/6: Interpreter was used face-to-face during this interview.  Patient continues to minimize her mental health presentation, remains discharge focused stating her baby is waiting for her and patient wants to breast-feed the baby.  She is unable to acknowledge the events that led up to the hospitalization.  She denies SI/HI/plan and denies hallucinations.  Social work team and this provider reached out to patient's husband through interpreter and patient  husband continues to express his concern that patient is still displaying paranoia and disorganized thought process and is not at her baseline.  Has been informed the team that patient was getting LAI but is unable to recall the name of the medication..  He agreed to provide  contact details of patient's psychiatrist outpatient and the name of the medication so that patient can be restarted on it.  Family is not comfortable with patient discharge at this time.   1/5: Patient is seen today with a virtual interpreter.  Patient speaks Arabic.  When asked about the reasons for her hospitalization patient is able to respond that she went to her hospital to have a baby and baby went home but patient was brought to the psychiatric hospital.  She reports living with husband and her children.  She denies any abuse or any stressors at home.  She remains discharge focused and wants to go home stating that the baby is on formula now but she wants to go breast-feed the baby.  She denies feeling depressed or sad.  She reports that she is just waiting for the time when her family visits her.  She reports fair appetite and sleep.  Per treatment team patient is responding to Zyprexa  but still has residual disorganized thought process at times.  Patient wanted to know the medication name that she is taking.  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   1/4: Patient seen in the presence of an interpreter. She reports that she is feeling normal and that she doesn't want to take medication. She reports the medication given last night made her sleep too long. When asked how long she slept, she reports she slept from 9 PM to 3 AM. She then reports that her normal sleeping pattern is like a normal person  from around 9 PM to 8  AM. Discussed that she has the option of taking oral medication as opposed to the shot if she felt like the shot made her sleep too long (Although it was brought to her attention that sleeping 9 PM to 3 AM was six hours). She then reported to writer that the injection last night was enough and she doesn't need anymore. She would like to go home and she can come back and follow up with psychiatry  if needed in the future. Discussed that that is not currently an option as she is under involuntary commitment and she reported that doctors told her she was fine and was no longer required to stay (Of which is not accurate). She denies SI, HI, AVH. She also denies ever having any psychiatric diagnoses. She then ends the assessment and gets on the phone to call her husband. She was not observed to be upset on the phone with her husband, but was able to return to her room and self regulate.   Patient continues to show delusional thinking. Her mood is irritable and her affect is blunt. She continues to deny medications and deny a history of a psychiatric disorder. Due to ongoing symptoms of psychosis, the patient continues to meet criteria for involuntary commitment.   1/3: Patient seen in the presence of an interpreter.    Initially, she reported that she is done with the doctors. She then was OK speaking to clinical research associate. She reports she has taken all of the medication that she needs and needs no more. She reports that the doctor told  her she does not have to take medication. She is unable to identify which doctor this was.     Although being told several times that patient would be staying in the hospital she continue to state that she is being discharged with her husband. At one point stating that she has already been discharged. She denies SI, HI, and AVH. Her thought process continues to contain delusions and is disorganized.    When discussing potential for forced medication, patient did not comprehend the information that was being told to her. Attempted to discuss taking oral medication and patient continue to decline. She continues to state that she does not need to see psychiatry.    MD spoke with family. Family reported that patient has been off of her medication for some time. They report she does much better when she is on medication and that this is not her baseline. They would like her to get  restarted back on medication. Please review MD note discussion forced medication and family discussion. Family confirmed to nursing that patient is not breastfeeding and that the infant is taking formula.   Past Psychiatric History: see h&P Family History:  Family History  Problem Relation Age of Onset   Hypertension Mother    Diabetes Mother    Diabetes Father    Hypertension Father    Social History:  Social History   Substance and Sexual Activity  Alcohol Use No   Alcohol/week: 0.0 standard drinks of alcohol     Social History   Substance and Sexual Activity  Drug Use No    Social History   Socioeconomic History   Marital status: Married    Spouse name: Not on file   Number of children: Not on file   Years of education: Not on file   Highest education level: Not on file  Occupational History   Not on file  Tobacco Use   Smoking status: Never   Smokeless tobacco: Never  Vaping Use   Vaping status: Never Used  Substance and Sexual Activity   Alcohol use: No    Alcohol/week: 0.0 standard drinks of alcohol   Drug use: No   Sexual activity: Yes  Other Topics Concern   Not on file  Social History Narrative   Not on file   Social Drivers of Health   Tobacco Use: Low Risk (05/31/2024)   Patient History    Smoking Tobacco Use: Never    Smokeless Tobacco Use: Never    Passive Exposure: Not on file  Financial Resource Strain: Not on file  Food Insecurity: No Food Insecurity (05/31/2024)   Epic    Worried About Programme Researcher, Broadcasting/film/video in the Last Year: Never true    Ran Out of Food in the Last Year: Never true  Transportation Needs: No Transportation Needs (05/31/2024)   Epic    Lack of Transportation (Medical): No    Lack of Transportation (Non-Medical): No  Physical Activity: Not on file  Stress: Not on file  Social Connections: Not on file  Depression (PHQ2-9): Low Risk (01/27/2023)   Depression (PHQ2-9)    PHQ-2 Score: 0  Alcohol Screen: Low Risk (05/31/2024)    Alcohol Screen    Last Alcohol Screening Score (AUDIT): 0  Housing: Low Risk (05/31/2024)   Epic    Unable to Pay for Housing in the Last Year: No    Number of Times Moved in the Last Year: 0    Homeless in the Last Year: No  Utilities:  Not At Risk (05/31/2024)   Epic    Threatened with loss of utilities: No  Health Literacy: Not on file   Past Medical History:  Past Medical History:  Diagnosis Date   Anemia    Depression    Schizophrenia (HCC)     Past Surgical History:  Procedure Laterality Date   NO PAST SURGERIES      Current Medications: Current Facility-Administered Medications  Medication Dose Route Frequency Provider Last Rate Last Admin   acetaminophen  (TYLENOL ) tablet 650 mg  650 mg Oral Q4H PRN Jacquetta Sharlot GRADE, NP       alum & mag hydroxide-simeth (MAALOX/MYLANTA) 200-200-20 MG/5ML suspension 30 mL  30 mL Oral Q4H PRN Lord, Jamison Y, NP       benzocaine -Menthol  (DERMOPLAST) 20-0.5 % topical spray 1 Application  1 Application Topical PRN Lord, Jamison Y, NP       coconut oil  1 Application Topical PRN Jacquetta Sharlot GRADE, NP       witch hazel-glycerin  (TUCKS) pad 1 Application  1 Application Topical PRN Jacquetta Sharlot GRADE, NP       And   dibucaine (NUPERCAINAL) 1 % rectal ointment 1 Application  1 Application Rectal PRN Jacquetta Sharlot GRADE, NP       diphenhydrAMINE  (BENADRYL ) capsule 25 mg  25 mg Oral Q6H PRN Jacquetta Sharlot GRADE, NP       ferrous sulfate  tablet 325 mg  325 mg Oral Q1400 Jacquetta Sharlot GRADE, NP       ibuprofen  (ADVIL ) tablet 600 mg  600 mg Oral Q6H Jacquetta Sharlot GRADE, NP   600 mg at 06/07/24 9396   magnesium  hydroxide (MILK OF MAGNESIA) suspension 30 mL  30 mL Oral Daily PRN Jacquetta Sharlot GRADE, NP       OLANZapine  (ZYPREXA ) tablet 10 mg  10 mg Oral QHS Jadapalle, Sree, MD   10 mg at 06/08/24 2153   Or   OLANZapine  (ZYPREXA ) injection 10 mg  10 mg Intramuscular QHS Jadapalle, Sree, MD       OLANZapine  (ZYPREXA ) injection 5 mg  5 mg Intramuscular TID PRN Jacquetta Sharlot GRADE, NP        prenatal multivitamin tablet 1 tablet  1 tablet Oral Q1200 Jacquetta Sharlot GRADE, NP       senna-docusate (Senokot-S) tablet 2 tablet  2 tablet Oral Daily Jacquetta Sharlot GRADE, NP       simethicone  (MYLICON) chewable tablet 80 mg  80 mg Oral PRN Jacquetta Sharlot GRADE, NP       zolpidem  (AMBIEN ) tablet 5 mg  5 mg Oral QHS PRN Jacquetta Sharlot GRADE, NP   5 mg at 06/07/24 2114    Lab Results: No results found for this or any previous visit (from the past 48 hours).  Blood Alcohol level:  Lab Results  Component Value Date   [facility] <15 11/29/2023   ETH <10 10/11/2017    Metabolic Disorder Labs: Lab Results  Component Value Date   HGBA1C 5.0 05/29/2024   MPG 96.8 05/29/2024   MPG 91.06 10/18/2017   Lab Results  Component Value Date   PROLACTIN 166.7 (H) 10/18/2017   Lab Results  Component Value Date   CHOL 311 (H) 05/29/2024   TRIG 196 (H) 05/29/2024   HDL 55 05/29/2024   CHOLHDL 5.7 05/29/2024   VLDL 39 05/29/2024   LDLCALC 217 (H) 05/29/2024   LDLCALC 102 (H) 10/18/2017    Physical Findings: AIMS:  , ,  ,  ,    CIWA:  COWS:      Psychiatric Specialty Exam:  Presentation  General Appearance:  Appropriate for Environment; Casual  Eye Contact: Fair  Speech: Clear and Coherent  Speech Volume: Normal    Mood and Affect  Mood: Irritable  Affect: Blunt   Thought Process  Thought Processes: Disorganized  Orientation:Full (Time, Place and Person)  Thought Content:Delusions; Tangential  Hallucinations:No data recorded Ideas of Reference:Delusions  Suicidal Thoughts:No data recorded Homicidal Thoughts:No data recorded  Sensorium  Memory: Immediate Poor  Judgment: Impaired  Insight: Lacking   Executive Functions  Concentration: Fair  Attention Span: Fair  Recall: Poor  Fund of Knowledge: Fair  Language: Fair   Psychomotor Activity  Psychomotor Activity:No data recorded Musculoskeletal: Strength & Muscle Tone: within normal limits Gait &  Station: normal Assets  Assets: Housing; Social Support    Physical Exam: Physical Exam ROS Blood pressure (!) 108/56, pulse 99, temperature 97.6 F (36.4 C), temperature source Oral, resp. rate 20, height 4' 4 (1.321 m), weight 54 kg, last menstrual period 08/04/2023, SpO2 99%, unknown if currently breastfeeding. Body mass index is 30.94 kg/m.  Diagnosis: Principal Problem:   Schizophrenia, undifferentiated (HCC)   PLAN: Safety and Monitoring:  -- Voluntary admission to inpatient psychiatric unit for safety, stabilization and treatment  -- Daily contact with patient to assess and evaluate symptoms and progress in treatment  -- Patient's case to be discussed in multi-disciplinary team meeting  -- Observation Level : q15 minute checks  -- Vital signs:  q12 hours  -- Precautions: suicide, elopement, and assault -- Encouraged patient to participate in unit milieu and in scheduled group therapies  2. Psychiatric Treatment:  Scheduled Medications: Continue current medications    -- The risks/benefits/side-effects/alternatives to this medication were discussed in detail with the patient and time was given for questions. The patient consents to medication trial.  3. Medical Issues Being Addressed:     4. Discharge Planning:   -- Social work and case management to assist with discharge planning and identification of hospital follow-up needs prior to discharge  -- Estimated LOS: 3-4 days  Daine KATHEE Ober, NP 06/09/2024, 9:13 AM

## 2024-06-09 NOTE — Progress Notes (Signed)
" °   06/09/24 2100  Psych Admission Type (Psych Patients Only)  Admission Status Involuntary  Psychosocial Assessment  Patient Complaints None  Eye Contact Brief  Facial Expression Flat  Affect Preoccupied  Speech Logical/coherent  Interaction Isolative  Motor Activity Other (Comment) (WDL)  Appearance/Hygiene Unremarkable  Behavior Characteristics Calm;Appropriate to situation  Mood Preoccupied  Aggressive Behavior  Effect No apparent injury  Thought Process  Coherency Concrete thinking  Content Preoccupation  Delusions None reported or observed  Perception WDL  Hallucination None reported or observed  Judgment Impaired  Confusion None  Danger to Self  Current suicidal ideation? Denies  Agreement Not to Harm Self Yes  Description of Agreement Verbal  Danger to Others  Danger to Others None reported or observed    "

## 2024-06-09 NOTE — Plan of Care (Signed)
   Problem: Education: Goal: Knowledge of [facility] Education information/materials will improve Outcome: Progressing Goal: Emotional status will improve Outcome: Progressing Goal: Mental status will improve Outcome: Progressing Goal: Verbalization of understanding the information provided will improve Outcome: Progressing   Problem: Activity: Goal: Interest or engagement in activities will improve Outcome: Progressing Goal: Sleeping patterns will improve Outcome: Progressing   Problem: Coping: Goal: Ability to verbalize frustrations and anger appropriately will improve Outcome: Progressing Goal: Ability to demonstrate self-control will improve Outcome: Progressing   Problem: Health Behavior/Discharge Planning: Goal: Identification of resources available to assist in meeting health care needs will improve Outcome: Progressing Goal: Compliance with treatment plan for underlying cause of condition will improve Outcome: Progressing   Problem: Physical Regulation: Goal: Ability to maintain clinical measurements within normal limits will improve Outcome: Progressing   Problem: Safety: Goal: Periods of time without injury will increase Outcome: Progressing   Problem: Education: Goal: Knowledge of General Education information will improve Description: Including pain rating scale, medication(s)/side effects and non-pharmacologic comfort measures Outcome: Progressing   Problem: Health Behavior/Discharge Planning: Goal: Ability to manage health-related needs will improve Outcome: Progressing   Problem: Clinical Measurements: Goal: Ability to maintain clinical measurements within normal limits will improve Outcome: Progressing Goal: Will remain free from infection Outcome: Progressing Goal: Diagnostic test results will improve Outcome: Progressing Goal: Respiratory complications will improve Outcome: Progressing Goal: Cardiovascular complication will be  avoided Outcome: Progressing   Problem: Activity: Goal: Risk for activity intolerance will decrease Outcome: Progressing   Problem: Nutrition: Goal: Adequate nutrition will be maintained Outcome: Progressing   Problem: Coping: Goal: Level of anxiety will decrease Outcome: Progressing   Problem: Elimination: Goal: Will not experience complications related to bowel motility Outcome: Progressing Goal: Will not experience complications related to urinary retention Outcome: Progressing   Problem: Pain Managment: Goal: General experience of comfort will improve and/or be controlled Outcome: Progressing   Problem: Safety: Goal: Ability to remain free from injury will improve Outcome: Progressing   Problem: Skin Integrity: Goal: Risk for impaired skin integrity will decrease Outcome: Progressing

## 2024-06-10 NOTE — Plan of Care (Signed)
   Problem: Education: Goal: Knowledge of [facility] Education information/materials will improve Outcome: Progressing Goal: Emotional status will improve Outcome: Progressing Goal: Mental status will improve Outcome: Progressing Goal: Verbalization of understanding the information provided will improve Outcome: Progressing   Problem: Activity: Goal: Interest or engagement in activities will improve Outcome: Progressing Goal: Sleeping patterns will improve Outcome: Progressing   Problem: Coping: Goal: Ability to verbalize frustrations and anger appropriately will improve Outcome: Progressing Goal: Ability to demonstrate self-control will improve Outcome: Progressing   Problem: Health Behavior/Discharge Planning: Goal: Identification of resources available to assist in meeting health care needs will improve Outcome: Progressing Goal: Compliance with treatment plan for underlying cause of condition will improve Outcome: Progressing   Problem: Physical Regulation: Goal: Ability to maintain clinical measurements within normal limits will improve Outcome: Progressing   Problem: Safety: Goal: Periods of time without injury will increase Outcome: Progressing   Problem: Education: Goal: Knowledge of General Education information will improve Description: Including pain rating scale, medication(s)/side effects and non-pharmacologic comfort measures Outcome: Progressing   Problem: Health Behavior/Discharge Planning: Goal: Ability to manage health-related needs will improve Outcome: Progressing   Problem: Clinical Measurements: Goal: Ability to maintain clinical measurements within normal limits will improve Outcome: Progressing Goal: Will remain free from infection Outcome: Progressing Goal: Diagnostic test results will improve Outcome: Progressing Goal: Respiratory complications will improve Outcome: Progressing Goal: Cardiovascular complication will be  avoided Outcome: Progressing   Problem: Activity: Goal: Risk for activity intolerance will decrease Outcome: Progressing   Problem: Nutrition: Goal: Adequate nutrition will be maintained Outcome: Progressing   Problem: Coping: Goal: Level of anxiety will decrease Outcome: Progressing   Problem: Elimination: Goal: Will not experience complications related to bowel motility Outcome: Progressing Goal: Will not experience complications related to urinary retention Outcome: Progressing   Problem: Pain Managment: Goal: General experience of comfort will improve and/or be controlled Outcome: Progressing   Problem: Safety: Goal: Ability to remain free from injury will improve Outcome: Progressing   Problem: Skin Integrity: Goal: Risk for impaired skin integrity will decrease Outcome: Progressing

## 2024-06-10 NOTE — Progress Notes (Signed)
" °   06/10/24 2100  Psych Admission Type (Psych Patients Only)  Admission Status Involuntary  Psychosocial Assessment  Patient Complaints None  Eye Contact Brief  Facial Expression Animated  Affect Preoccupied  Speech Logical/coherent  Interaction Isolative  Motor Activity Other (Comment) (WDL)  Appearance/Hygiene Unremarkable  Behavior Characteristics Appropriate to situation;Cooperative  Mood Pleasant  Aggressive Behavior  Effect No apparent injury  Thought Process  Coherency Concrete thinking  Content Preoccupation  Delusions None reported or observed  Perception WDL  Hallucination None reported or observed  Judgment Limited  Confusion None  Danger to Self  Current suicidal ideation? Denies  Agreement Not to Harm Self Yes  Description of Agreement Verbal  Danger to Others  Danger to Others None reported or observed    "

## 2024-06-10 NOTE — Progress Notes (Signed)
" °   06/10/24 1100  Psychosocial Assessment  Patient Complaints None  Eye Contact Brief  Facial Expression Flat  Affect Preoccupied  Speech Logical/coherent  Interaction Isolative  Motor Activity Other (Comment)  Appearance/Hygiene Unremarkable  Behavior Characteristics Calm  Mood Preoccupied  Thought Process  Coherency Concrete thinking  Content Preoccupation  Delusions None reported or observed  Perception WDL  Hallucination None reported or observed  Judgment Limited  Confusion None  Danger to Self  Current suicidal ideation? Denies  Danger to Others  Danger to Others None reported or observed    "

## 2024-06-10 NOTE — Group Note (Signed)
 Date:  06/10/2024 Time:  8:39 PM  Additional Comments:  Did not attend group.  Amy Mcclain 06/10/2024, 8:39 PM

## 2024-06-10 NOTE — Group Note (Signed)
 Date:  06/10/2024 Time:  11:59 AM  Group Topic/Focus:  Healthy Communication:   The focus of this group is to discuss communication, barriers to communication, as well as healthy ways to communicate with others.    Participation Level:  Did Not Attend   [name] Missie Gehrig 06/10/2024, 11:59 AM

## 2024-06-10 NOTE — Plan of Care (Signed)
   Problem: Education: Goal: Knowledge of Amy Mcclain General Education information/materials will improve Outcome: Progressing Goal: Emotional status will improve Outcome: Progressing Goal: Mental status will improve Outcome: Progressing Goal: Verbalization of understanding the information provided will improve Outcome: Progressing   Problem: Activity: Goal: Interest or engagement in activities will improve Outcome: Progressing Goal: Sleeping patterns will improve Outcome: Progressing   Problem: Coping: Goal: Ability to verbalize frustrations and anger appropriately will improve Outcome: Progressing Goal: Ability to demonstrate self-control will improve Outcome: Progressing

## 2024-06-11 MED ORDER — OLANZAPINE 10 MG IM SOLR: 10.0000 mg | Freq: Every day | INTRAMUSCULAR | Status: DC

## 2024-06-11 MED ORDER — ARIPIPRAZOLE 5 MG PO TABS
5.0000 mg | ORAL_TABLET | Freq: Every day | ORAL | Status: DC
  Administered 2024-06-11 – 2024-06-12 (×2): 5 mg via ORAL
  Filled 2024-06-11 (×2): qty 1

## 2024-06-11 NOTE — Group Note (Signed)
 Date:  06/11/2024 Time:  9:58 PM  Group Topic/Focus:  Coping With Mental Health Crisis:   The purpose of this group is to help patients identify strategies for coping with mental health crisis.  Group discusses possible causes of crisis and ways to manage them effectively. Wrap-Up Group:   The focus of this group is to help patients review their daily goal of treatment and discuss progress on daily workbooks.    Participation Level:  Did Not Attend  Amy Mcclain 06/11/2024, 9:58 PM

## 2024-06-11 NOTE — Group Note (Signed)
 Recreation Therapy Group Note   Group Topic:Healthy Support Systems  Group Date: 06/11/2024 Start Time: 1015 End Time: 1110 Facilitators: Celestia Jeoffrey BRAVO, LRT, CTRS Location: Craft Room  Group Description: Straw Bridge. In groups or individually, patients were given 10 plastic drinking straws and an equal length of masking tape. Using the materials provided, patients were instructed to build a free-standing bridge-like structure to suspend an everyday item (ex: deck of cards) off the floor or table surface. All materials were required to be used in secondary school teacher. LRT facilitated post-activity discussion reviewing the importance of having strong and healthy support systems in our lives. LRT discussed how the people in our lives serve as the tape and the deck of cards we placed on top of our straw structure are the stressors we face in daily life. LRT and pts discussed what happens in our life when things get too heavy for us , and we don't have strong supports outside of the hospital. Pt shared 2 of their healthy supports in their life aloud in the group.   Goal Area(s) Addressed:  Patient will identify 2 healthy supports in their life. Patient will identify skills to successfully complete activity. Patient will identify correlation of this activity to life post-discharge.  Patient will build on frustration tolerance skills. Patient will increase team building and communication skills.    Affect/Mood: N/A   Participation Level: Did not attend    Clinical Observations/Individualized Feedback: Patient did not attend.  Plan: Continue to engage patient in RT group sessions 2-3x/week.   Jeoffrey BRAVO Celestia, LRT, CTRS 06/11/2024 11:40 AM

## 2024-06-11 NOTE — Progress Notes (Signed)
" °   06/11/24 1000  Psych Admission Type (Psych Patients Only)  Admission Status Involuntary  Psychosocial Assessment  Patient Complaints Sleep disturbance;None  Eye Contact Brief  Facial Expression Animated  Affect Preoccupied  Speech Logical/coherent  Interaction Assertive  Motor Activity Other (Comment) (wnl)  Appearance/Hygiene Unremarkable  Behavior Characteristics Cooperative;Appropriate to situation  Mood Pleasant  Aggressive Behavior  Effect No apparent injury  Thought Process  Coherency Concrete thinking  Content Preoccupation  Delusions None reported or observed  Perception WDL  Hallucination None reported or observed  Judgment Limited  Confusion None  Danger to Self  Current suicidal ideation? Denies  Agreement Not to Harm Self Yes  Description of Agreement verbal  Danger to Others  Danger to Others None reported or observed    "

## 2024-06-11 NOTE — [facility] Counselor (Signed)
 CSW spoke with the patient with the use of Nuha, the interpreter on site.  CSW informed that provider recommendation was for an ACTT team.    Patient declined ACTT team.   Patient began to discuss medications vs medicines.  Pt reported that she thinks medicines are like supplements and medications are more long-term.    Patient stated that she did not need medications.   Sherryle Margo, MSW, LCSW 06/11/2024 3:23 PM

## 2024-06-11 NOTE — Group Note (Signed)
 Date:  06/11/2024 Time:  10:05 AM  Group Topic/Focus:  Goals Group:   The focus of this group is to help patients establish daily goals to achieve during treatment and discuss how the patient can incorporate goal setting into their daily lives to aide in recovery.    Participation Level:  Did Not Attend   Amy Mcclain 06/11/2024, 10:05 AM

## 2024-06-11 NOTE — Group Note (Signed)
"                                                 [facility] LCSW Group Therapy Note    Group Date: 06/11/2024 Start Time: 1300 End Time: 1350  Type of Therapy and Topic:  Group Therapy:  Overcoming Obstacles  Participation Level:  [facility] PARTICIPATION LEVEL: Did Not Attend  Description of Group:   In this group patients will be encouraged to explore what they see as obstacles to their own wellness and recovery. They will be guided to discuss their thoughts, feelings, and behaviors related to these obstacles. The group will process together ways to cope with barriers, with attention given to specific choices patients can make. Each patient will be challenged to identify changes they are motivated to make in order to overcome their obstacles. This group will be process-oriented, with patients participating in exploration of their own experiences as well as giving and receiving support and challenge from other group members.  Therapeutic Goals: 1. Patient will identify personal and current obstacles as they relate to admission. 2. Patient will identify barriers that currently interfere with their wellness or overcoming obstacles.  3. Patient will identify feelings, thought process and behaviors related to these barriers. 4. Patient will identify two changes they are willing to make to overcome these obstacles:    Summary of Patient Progress Patient did not attend group.   Therapeutic Modalities:   Cognitive Behavioral Therapy Solution Focused Therapy Motivational Interviewing Relapse Prevention Therapy   Nadara JONELLE Fam, LCSW "

## 2024-06-11 NOTE — Plan of Care (Signed)
   Problem: Education: Goal: Knowledge of Amy Mcclain General Education information/materials will improve Outcome: Progressing Goal: Emotional status will improve Outcome: Progressing Goal: Mental status will improve Outcome: Progressing Goal: Verbalization of understanding the information provided will improve Outcome: Progressing   Problem: Activity: Goal: Interest or engagement in activities will improve Outcome: Progressing Goal: Sleeping patterns will improve Outcome: Progressing   Problem: Coping: Goal: Ability to verbalize frustrations and anger appropriately will improve Outcome: Progressing Goal: Ability to demonstrate self-control will improve Outcome: Progressing

## 2024-06-11 NOTE — Plan of Care (Signed)
  Problem: Education: Goal: Emotional status will improve Outcome: Progressing Goal: Mental status will improve Outcome: Progressing Goal: Verbalization of understanding the information provided will improve Outcome: Progressing   Problem: Activity: Goal: Sleeping patterns will improve Outcome: Progressing   Problem: Physical Regulation: Goal: Ability to maintain clinical measurements within normal limits will improve Outcome: Progressing   Problem: Safety: Goal: Periods of time without injury will increase Outcome: Progressing

## 2024-06-11 NOTE — Group Note (Signed)
 Date:  06/11/2024 Time:  10:02 AM  Group Topic/Focus:  Activity Group: The focus of the group is to encourage the patients to go outside to the courtyard and get some fresh air and some exercise.    Participation Level:  Did Not Attend   [name] Tishana Clinkenbeard 06/11/2024, 10:02 AM

## 2024-06-11 NOTE — Progress Notes (Signed)
 Pt calm and pleasant during assessment denying SI/HI/AVH. Pt observed interacting appropriately with staff and peers on the unit. Pt compliant with medication administration per MD orders. Pt given education, support, and encouragement to be active in her treatment plan. Pt being monitored Q 15 minutes for safety per unit protocol, remains safe on the unit

## 2024-06-11 NOTE — Group Note (Signed)
 Date:  06/18/2024 Time:  10:55 AM  Group Topic/Focus:  Self Care:   The focus of this group is to help patients understand the importance of self-care in order to improve or restore emotional, physical, spiritual, interpersonal, and financial health.    Participation Level:  Did Not Attend  Participation Quality:    Affect:    Cognitive:    Insight:   Engagement in Group:    Modes of Intervention:    Additional Comments:    Yahaira Bruski 06/18/2024, 10:55 AM

## 2024-06-11 NOTE — Progress Notes (Signed)
 " [facility] MD Progress Note  06/11/2024 11:42 AM Amy Mcclain  MRN:  [id number]   Subjective:  Chart reviewed, case discussed in multidisciplinary meeting, patient seen during rounds.   06/11/24: Patient seen today with interpreter. She reports he is good. She continues to express some paranoia, not trusting the vasoline from the hospital, not feeling like she needs medication. She expresses that doctors have told her she can go home and that she no longer needs any medication; medical or psychiatric. She is cooperative and pleasant in engaging with clinical research associate. She is discharged focused.   She denies SI, HI, and AVH.   06/09/2024: Patient is assessed today on the inpatient unit with face to face interpreter. Explains that she does not need medication that this hospital is providing her. Although she states that the medication that she was receiving from her outpatient provider was beneficial, she does not believe that she is receiving the same medication here. She continues to display disorganized thinking and that the chemicals are not good for her. Patient continues to keep a bandage that was placed after she received an injection, stating that it is not good for her. She is discharge focused that she needs to return home to her infant that he receiving formula although family has confirmed that she is not breastfeeding the child. She denies all psychiatric symptoms at this time.   06/08/24: Patient was interviewed with face-to-face interpreter.  Patient continues to lack insight into her mental health problems.  Per nursing patient has declined to take any of her medication.  Provider explained the need for her to take psychotropic medications at least Zyprexa  to help with her mood.  Patient reports that she is not diagnosed with any mental health problems and she does not need to take any medications.  She denies SI/HI/plan and denies hallucinations but displays disorganized thought process.     06/07/24:  Patient is noted to be in the room with the translator.  Unfortunately the translator was not translating but summarizing the patient's statements and asking the provider questions about her treatment rather than translation.  Patient continues to display lack of insight into her mental health problems.  She starts blaming the medication saying she is receiving medications for constipation when she does not need it and reports that the medication she is taking is giving her side effects of breast tenderness and dry nose.  Patient is taking Zyprexa  but continues to state that she does not need any medications.  Provider explained multiple times that her family including her husband and her 40 year old daughter expressed significant concerns to the team about patient being out at baseline and patient holding a long history of refusing all treatment and stopping medications when she gets discharged to home.  Patient denies SI/HI and hallucinations.  Patient displays disorganized thought processes.     06/06/24: Patient is interviewed today in the presence of interpreter.  Patient denies SI/HI/hallucinations.  When provider tried to discuss long-acting injectable that she was taking in the past patient brushed it off saying she does not have any diagnosis of schizophrenia and that her doctor told her she does not need any shots anymore.  Later on she says she will not take any LAI's until 6 months after her discharge.  Patient lacks insight into her mental health problems.  Patient is taking the oral Zyprexa .  She denies any side effects to the medications.     1/6: Interpreter was used face-to-face during this interview.  Patient  continues to minimize her mental health presentation, remains discharge focused stating her baby is waiting for her and patient wants to breast-feed the baby.  She is unable to acknowledge the events that led up to the hospitalization.  She denies SI/HI/plan and denies hallucinations.   Social work team and this provider reached out to patient's husband through interpreter and patient husband continues to express his concern that patient is still displaying paranoia and disorganized thought process and is not at her baseline.  Has been informed the team that patient was getting LAI but is unable to recall the name of the medication..  He agreed to provide contact details of patient's psychiatrist outpatient and the name of the medication so that patient can be restarted on it.  Family is not comfortable with patient discharge at this time.   1/5: Patient is seen today with a virtual interpreter.  Patient speaks Arabic.  When asked about the reasons for her hospitalization patient is able to respond that she went to her hospital to have a baby and baby went home but patient was brought to the psychiatric hospital.  She reports living with husband and her children.  She denies any abuse or any stressors at home.  She remains discharge focused and wants to go home stating that the baby is on formula now but she wants to go breast-feed the baby.  She denies feeling depressed or sad.  She reports that she is just waiting for the time when her family visits her.  She reports fair appetite and sleep.  Per treatment team patient is responding to Zyprexa  but still has residual disorganized thought process at times.  Patient wanted to know the medication name that she is taking.  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   1/4: Patient seen in the presence of an interpreter. She reports that she is feeling normal and that she doesn't want to take medication. She reports the medication given last night made her sleep too long. When asked how long she slept, she reports she slept from 9 PM to 3 AM. She then reports that her normal sleeping pattern is like a normal person  from around 9 PM to 8  AM. Discussed that she  has the option of taking oral medication as opposed to the shot if she felt like the shot made her sleep too long (Although it was brought to her attention that sleeping 9 PM to 3 AM was six hours). She then reported to writer that the injection last night was enough and she doesn't need anymore. She would like to go home and she can come back and follow up with psychiatry if needed in the future. Discussed that that is not currently an option as she is under involuntary commitment and she reported that doctors told her she was fine and was no longer required to stay (Of which is not accurate). She denies SI, HI, AVH. She also denies ever having any psychiatric diagnoses. She then ends the assessment and gets on the phone to call her husband. She was not observed to be upset on the phone with her husband, but was able to return to her room and self regulate.   Patient continues to show delusional thinking. Her mood is irritable and her affect is blunt. She continues to deny medications and deny a history of a psychiatric disorder. Due to ongoing symptoms of psychosis, the  patient continues to meet criteria for involuntary commitment.   1/3: Patient seen in the presence of an interpreter.    Initially, she reported that she is done with the doctors. She then was OK speaking to clinical research associate. She reports she has taken all of the medication that she needs and needs no more. She reports that the doctor told her she does not have to take medication. She is unable to identify which doctor this was.     Although being told several times that patient would be staying in the hospital she continue to state that she is being discharged with her husband. At one point stating that she has already been discharged. She denies SI, HI, and AVH. Her thought process continues to contain delusions and is disorganized.    When discussing potential for forced medication, patient did not comprehend the information that was being told  to her. Attempted to discuss taking oral medication and patient continue to decline. She continues to state that she does not need to see psychiatry.    MD spoke with family. Family reported that patient has been off of her medication for some time. They report she does much better when she is on medication and that this is not her baseline. They would like her to get restarted back on medication. Please review MD note discussion forced medication and family discussion. Family confirmed to nursing that patient is not breastfeeding and that the infant is taking formula.   Past Psychiatric History: see h&P Family History:  Family History  Problem Relation Age of Onset   Hypertension Mother    Diabetes Mother    Diabetes Father    Hypertension Father    Social History:  Social History   Substance and Sexual Activity  Alcohol Use No   Alcohol/week: 0.0 standard drinks of alcohol     Social History   Substance and Sexual Activity  Drug Use No    Social History   Socioeconomic History   Marital status: Married    Spouse name: Not on file   Number of children: Not on file   Years of education: Not on file   Highest education level: Not on file  Occupational History   Not on file  Tobacco Use   Smoking status: Never   Smokeless tobacco: Never  Vaping Use   Vaping status: Never Used  Substance and Sexual Activity   Alcohol use: No    Alcohol/week: 0.0 standard drinks of alcohol   Drug use: No   Sexual activity: Yes  Other Topics Concern   Not on file  Social History Narrative   Not on file   Social Drivers of Health   Tobacco Use: Low Risk (05/31/2024)   Patient History    Smoking Tobacco Use: Never    Smokeless Tobacco Use: Never    Passive Exposure: Not on file  Financial Resource Strain: Not on file  Food Insecurity: No Food Insecurity (05/31/2024)   Epic    Worried About Programme Researcher, Broadcasting/film/video in the Last Year: Never true    Ran Out of Food in the Last Year: Never  true  Transportation Needs: No Transportation Needs (05/31/2024)   Epic    Lack of Transportation (Medical): No    Lack of Transportation (Non-Medical): No  Physical Activity: Not on file  Stress: Not on file  Social Connections: Not on file  Depression (PHQ2-9): Low Risk (01/27/2023)   Depression (PHQ2-9)    PHQ-2 Score: 0  Alcohol  Screen: Low Risk (05/31/2024)   Alcohol Screen    Last Alcohol Screening Score (AUDIT): 0  Housing: Low Risk (05/31/2024)   Epic    Unable to Pay for Housing in the Last Year: No    Number of Times Moved in the Last Year: 0    Homeless in the Last Year: No  Utilities: Not At Risk (05/31/2024)   Epic    Threatened with loss of utilities: No  Health Literacy: Not on file   Past Medical History:  Past Medical History:  Diagnosis Date   Anemia    Depression    Schizophrenia (HCC)     Past Surgical History:  Procedure Laterality Date   NO PAST SURGERIES      Current Medications: Current Facility-Administered Medications  Medication Dose Route Frequency Provider Last Rate Last Admin   acetaminophen  (TYLENOL ) tablet 650 mg  650 mg Oral Q4H PRN Jacquetta Sharlot GRADE, NP       alum & mag hydroxide-simeth (MAALOX/MYLANTA) 200-200-20 MG/5ML suspension 30 mL  30 mL Oral Q4H PRN Jacquetta Sharlot GRADE, NP       benzocaine -Menthol  (DERMOPLAST) 20-0.5 % topical spray 1 Application  1 Application Topical PRN Lord, Jamison Y, NP       coconut oil  1 Application Topical PRN Jacquetta Sharlot GRADE, NP       witch hazel-glycerin  (TUCKS) pad 1 Application  1 Application Topical PRN Jacquetta Sharlot GRADE, NP       And   dibucaine (NUPERCAINAL) 1 % rectal ointment 1 Application  1 Application Rectal PRN Jacquetta Sharlot GRADE, NP       diphenhydrAMINE  (BENADRYL ) capsule 25 mg  25 mg Oral Q6H PRN Jacquetta Sharlot GRADE, NP       ferrous sulfate  tablet 325 mg  325 mg Oral Q1400 Jacquetta Sharlot GRADE, NP       ibuprofen  (ADVIL ) tablet 600 mg  600 mg Oral Q6H Jacquetta Sharlot GRADE, NP   600 mg at 06/11/24 9444   magnesium   hydroxide (MILK OF MAGNESIA) suspension 30 mL  30 mL Oral Daily PRN Jacquetta Sharlot GRADE, NP       OLANZapine  (ZYPREXA ) tablet 10 mg  10 mg Oral QHS Jadapalle, Sree, MD   10 mg at 06/10/24 2116   Or   OLANZapine  (ZYPREXA ) injection 10 mg  10 mg Intramuscular QHS Jadapalle, Sree, MD       OLANZapine  (ZYPREXA ) injection 5 mg  5 mg Intramuscular TID PRN Jacquetta Sharlot GRADE, NP       prenatal multivitamin tablet 1 tablet  1 tablet Oral Q1200 Jacquetta Sharlot GRADE, NP       senna-docusate (Senokot-S) tablet 2 tablet  2 tablet Oral Daily Lord, Jamison Y, NP       simethicone  (MYLICON) chewable tablet 80 mg  80 mg Oral PRN Lord, Jamison Y, NP       zolpidem  (AMBIEN ) tablet 5 mg  5 mg Oral QHS PRN Lord, Jamison Y, NP   5 mg at 06/10/24 2116    Lab Results: No results found for this or any previous visit (from the past 48 hours).  Blood Alcohol level:  Lab Results  Component Value Date   [facility] <15 11/29/2023   ETH <10 10/11/2017    Metabolic Disorder Labs: Lab Results  Component Value Date   HGBA1C 5.0 05/29/2024   MPG 96.8 05/29/2024   MPG 91.06 10/18/2017   Lab Results  Component Value Date   PROLACTIN 166.7 (H) 10/18/2017   Lab  Results  Component Value Date   CHOL 311 (H) 05/29/2024   TRIG 196 (H) 05/29/2024   HDL 55 05/29/2024   CHOLHDL 5.7 05/29/2024   VLDL 39 05/29/2024   LDLCALC 217 (H) 05/29/2024   LDLCALC 102 (H) 10/18/2017    Physical Findings: AIMS:  , ,  ,  ,    CIWA:    COWS:      Psychiatric Specialty Exam:  Presentation  General Appearance:  Appropriate for Environment; Casual  Eye Contact: Fair  Speech: Clear and Coherent; Normal Rate  Speech Volume: Normal    Mood and Affect  Mood: Euthymic  Affect: Blunt   Thought Process  Thought Processes: Coherent  Orientation:Full (Time, Place and Person)  Thought Content:Paranoid Ideation; Delusions  Hallucinations:Hallucinations: None  Ideas of Reference:Paranoia; Delusions  Suicidal Thoughts:Suicidal  Thoughts: No  Homicidal Thoughts:Homicidal Thoughts: No   Sensorium  Memory: Immediate Fair; Recent Fair  Judgment: Poor  Insight: Lacking   Executive Functions  Concentration: Fair  Attention Span: Fair  Recall: Fair  Fund of Knowledge: Fair  Language: Fair   Psychomotor Activity  Psychomotor Activity:Psychomotor Activity: Normal  Musculoskeletal: Strength & Muscle Tone: within normal limits Gait & Station: normal Assets  Assets: Manufacturing Systems Engineer; Housing; Social Support    Physical Exam: Physical Exam Vitals and nursing note reviewed.  Constitutional:      Appearance: Normal appearance.  Pulmonary:     Effort: Pulmonary effort is normal.  Neurological:     Mental Status: She is alert and oriented to person, place, and time.    Review of Systems  Respiratory:  Negative for shortness of breath.   Cardiovascular:  Negative for chest pain.  Gastrointestinal:  Negative for diarrhea, nausea and vomiting.  Psychiatric/Behavioral:  Negative for depression, hallucinations, substance abuse and suicidal ideas.   All other systems reviewed and are negative.  Blood pressure 127/80, pulse (!) 108, temperature 98.4 F (36.9 C), temperature source Oral, resp. rate 18, height 4' 4 (1.321 m), weight 54 kg, last menstrual period 08/04/2023, SpO2 100%, unknown if currently breastfeeding. Body mass index is 30.94 kg/m.  Diagnosis: Principal Problem:   Schizophrenia, undifferentiated (HCC)   PLAN: Safety and Monitoring:  -- Involuntary admission to inpatient psychiatric unit for safety, stabilization and treatment  -- Daily contact with patient to assess and evaluate symptoms and progress in treatment  -- Patient's case to be discussed in multi-disciplinary team meeting  -- Observation Level : q15 minute checks  -- Vital signs:  q12 hours  -- Precautions: suicide, elopement, and assault -- Encouraged patient to participate in unit milieu and in  scheduled group therapies  2. Psychiatric Treatment:  Scheduled Medications: Patient taking oral medication Zyprexa  - Jarom Govan consider starting LAI  Chart review shows previous LAI initiation of Abilify  Maintena in 2024 after D/C of Invega  due to elevated prolactin levels.   Will start Abilify  5 mg (forced medication linked to IM Zyprexa  if refused) orally daily with plan to titrate to 30 mg oral and begin LAI (if patient agrees).    -- The risks/benefits/side-effects/alternatives to this medication were discussed in detail with the patient and time was given for questions. The patient consents to medication trial.   3. Medical Issues Being Addressed:  No current medical issues expressed Patient declining vitamins at this time   4. Discharge Planning:   -- Social work and case management to assist with discharge planning and identification of hospital follow-up needs prior to discharge  -- Estimated LOS: Unable to determine  at this time  Cassell Voorhies, NP 06/11/2024, 11:42 AM  "

## 2024-06-11 NOTE — Group Note (Signed)
 Recreation Therapy Group Note   Group Topic:Coping Skills  Group Date: 06/11/2024 Start Time: 1530 End Time: 1605 Facilitators: Celestia Jeoffrey BRAVO, LRT, CTRS Location: Courtyard  Group Description: [employer]. LRT and patients played games of basketball, drew with chalk, and played corn hole while outside in the courtyard while getting fresh air and sunlight. Music was being played in the background. LRT and peers conversed about different games they have played before, what they do in their free time and anything else that is on their minds. LRT encouraged pts to drink water  after being outside, sweating and getting their heart rate up.  Goal Area(s) Addressed: Patient will build on frustration tolerance skills. Patients will partake in a competitive play game with peers. Patients will gain knowledge of new leisure interest/hobby   Affect/Mood: N/A   Participation Level: Did not attend    Clinical Observations/Individualized Feedback: Patient did not attend.  Plan: Continue to engage patient in RT group sessions 2-3x/week.   Jeoffrey BRAVO Celestia, LRT, CTRS 06/11/2024 5:13 PM

## 2024-06-12 NOTE — Progress Notes (Signed)
" °   06/12/24 2200  Psych Admission Type (Psych Patients Only)  Admission Status Involuntary  Psychosocial Assessment  Patient Complaints None  Eye Contact Brief  Facial Expression Animated  Affect Preoccupied  Speech Logical/coherent  Interaction Assertive  Motor Activity Slow  Appearance/Hygiene Unremarkable  Behavior Characteristics Cooperative  Mood Pleasant  Thought Process  Coherency Concrete thinking  Content Preoccupation  Delusions None reported or observed  Perception WDL  Hallucination None reported or observed  Judgment Impaired  Confusion None  Danger to Self  Current suicidal ideation? Denies  Agreement Not to Harm Self Yes  Description of Agreement verbal  Danger to Others  Danger to Others None reported or observed    "

## 2024-06-12 NOTE — Group Note (Signed)
 Recreation Therapy Group Note   Group Topic:Relaxation  Group Date: 06/12/2024 Start Time: 1525 End Time: 1605 Facilitators: Celestia Jeoffrey BRAVO, LRT, CTRS Location: Courtyard  Group Description: Meditation. LRT and patients discussed what they know about meditation and mindfulness. LRT played a Deep Breathing Meditation exercise script for patients to follow along to. LRT and patients discussed how meditation and deep breathing can be used as a coping skill post--discharge to help manage symptoms of stress.   Goal Area(s) Addressed: Patient will practice using relaxation technique. Patient will identify a new coping skill.  Patient will follow multistep directions to reduce anxiety and stress.   Affect/Mood: N/A   Participation Level: Did not attend    Clinical Observations/Individualized Feedback: Patient did not attend.  Plan: Continue to engage patient in RT group sessions 2-3x/week.   Jeoffrey BRAVO Celestia, LRT, CTRS 06/12/2024 5:08 PM

## 2024-06-12 NOTE — BH IP Treatment Plan (Signed)
 Interdisciplinary Treatment and Diagnostic Plan Update  06/12/2024 Time of Session: 2:00PM Amy Mcclain MRN: [id number]  Principal Diagnosis: Schizophrenia, undifferentiated (HCC)  Secondary Diagnoses: Principal Problem:   Schizophrenia, undifferentiated (HCC)   Current Medications:  Current Facility-Administered Medications  Medication Dose Route Frequency Provider Last Rate Last Admin   acetaminophen  (TYLENOL ) tablet 650 mg  650 mg Oral Q4H PRN Lord, Jamison Y, NP       alum & mag hydroxide-simeth (MAALOX/MYLANTA) 200-200-20 MG/5ML suspension 30 mL  30 mL Oral Q4H PRN Lord, Jamison Y, NP       ARIPiprazole  (ABILIFY ) tablet 5 mg  5 mg Oral QHS May, Tanya, NP   5 mg at 06/11/24 2107   Or   OLANZapine  (ZYPREXA ) injection 10 mg  10 mg Intramuscular QHS May, Tanya, NP       benzocaine -Menthol  (DERMOPLAST) 20-0.5 % topical spray 1 Application  1 Application Topical PRN Lord, Jamison Y, NP       coconut oil  1 Application Topical PRN Jacquetta Sharlot GRADE, NP       witch hazel-glycerin  (TUCKS) pad 1 Application  1 Application Topical PRN Jacquetta Sharlot GRADE, NP       And   dibucaine (NUPERCAINAL) 1 % rectal ointment 1 Application  1 Application Rectal PRN Jacquetta Sharlot GRADE, NP       diphenhydrAMINE  (BENADRYL ) capsule 25 mg  25 mg Oral Q6H PRN Jacquetta Sharlot GRADE, NP       ferrous sulfate  tablet 325 mg  325 mg Oral Q1400 Jacquetta Sharlot GRADE, NP       ibuprofen  (ADVIL ) tablet 600 mg  600 mg Oral Q6H Jacquetta Sharlot GRADE, NP   600 mg at 06/11/24 0555   magnesium  hydroxide (MILK OF MAGNESIA) suspension 30 mL  30 mL Oral Daily PRN Jacquetta Sharlot GRADE, NP       OLANZapine  (ZYPREXA ) injection 5 mg  5 mg Intramuscular TID PRN Lord, Jamison Y, NP       prenatal multivitamin tablet 1 tablet  1 tablet Oral Q1200 Jacquetta Sharlot GRADE, NP       senna-docusate (Senokot-S) tablet 2 tablet  2 tablet Oral Daily Lord, Jamison Y, NP       simethicone  (MYLICON) chewable tablet 80 mg  80 mg Oral PRN Lord, Jamison Y, NP       zolpidem  (AMBIEN )  tablet 5 mg  5 mg Oral QHS PRN Lord, Jamison Y, NP   5 mg at 06/11/24 2107   PTA Medications: Medications Prior to Admission  Medication Sig Dispense Refill Last Dose/Taking   ibuprofen  (ADVIL ) 600 MG tablet Take 1 tablet (600 mg total) by mouth every 6 (six) hours. 30 tablet 0 Unknown   OLANZapine  (ZYPREXA ) 5 MG tablet Take 1 tablet (5 mg total) by mouth at bedtime. 30 tablet 1 Taking   Prenatal Vit-Fe Fumarate-FA (PRENATAL MULTIVITAMIN) TABS tablet Take 1 tablet by mouth daily at 12 noon. 30 tablet 5 Unknown   lurasidone (LATUDA) 20 MG TABS tablet Take 20 mg by mouth at bedtime. (Patient not taking: Reported on 05/31/2024)   Not Taking    Patient Stressors:    Patient Strengths:    Treatment Modalities: Medication Management, Group therapy, Case management,  1 to 1 session with clinician, Psychoeducation, Recreational therapy.   Physician Treatment Plan for Primary Diagnosis: Schizophrenia, undifferentiated (HCC) Long Term Goal(s):     Short Term Goals:    Medication Management: Evaluate patient's response, side effects, and tolerance of medication regimen.  Therapeutic Interventions: 1  to 1 sessions, Unit Group sessions and Medication administration.  Evaluation of Outcomes: Progressing  Physician Treatment Plan for Secondary Diagnosis: Principal Problem:   Schizophrenia, undifferentiated (HCC)  Long Term Goal(s):     Short Term Goals:       Medication Management: Evaluate patient's response, side effects, and tolerance of medication regimen.  Therapeutic Interventions: 1 to 1 sessions, Unit Group sessions and Medication administration.  Evaluation of Outcomes: Progressing   RN Treatment Plan for Primary Diagnosis: Schizophrenia, undifferentiated (HCC) Long Term Goal(s): Knowledge of disease and therapeutic regimen to maintain health will improve  Short Term Goals: Ability to demonstrate self-control, Ability to participate in decision making will improve, Ability to  verbalize feelings will improve, Ability to disclose and discuss suicidal ideas, Ability to identify and develop effective coping behaviors will improve, and Compliance with prescribed medications will improve  Medication Management: RN will administer medications as ordered by provider, will assess and evaluate patient's response and provide education to patient for prescribed medication. RN will report any adverse and/or side effects to prescribing provider.  Therapeutic Interventions: 1 on 1 counseling sessions, Psychoeducation, Medication administration, Evaluate responses to treatment, Monitor vital signs and CBGs as ordered, Perform/monitor CIWA, COWS, AIMS and Fall Risk screenings as ordered, Perform wound care treatments as ordered.  Evaluation of Outcomes: Progressing   LCSW Treatment Plan for Primary Diagnosis: Schizophrenia, undifferentiated (HCC) Long Term Goal(s): Safe transition to appropriate next level of care at discharge, Engage patient in therapeutic group addressing interpersonal concerns.  Short Term Goals: Engage patient in aftercare planning with referrals and resources, Increase social support, Increase ability to appropriately verbalize feelings, Increase emotional regulation, Facilitate acceptance of mental health diagnosis and concerns, and Increase skills for wellness and recovery  Therapeutic Interventions: Assess for all discharge needs, 1 to 1 time with Social worker, Explore available resources and support systems, Assess for adequacy in community support network, Educate family and significant other(s) on suicide prevention, Complete Psychosocial Assessment, Interpersonal group therapy.  Evaluation of Outcomes: Progressing   Progress in Treatment: Attending groups: No. Participating in groups: No. Taking medication as prescribed: Yes. Toleration medication: Yes. Family/Significant other contact made: Yes, individual(s) contacted:  SPE completed with the  patient's husband.  Patient understands diagnosis: No. Discussing patient identified problems/goals with staff: No. Medical problems stabilized or resolved: Yes. Denies suicidal/homicidal ideation: Yes. Issues/concerns per patient self-inventory: No. Other: none  New problem(s) identified: No, Describe:  none  Update 06/07/2024:  No changes at this time. Update 06/12/2024: No changes at this time.    New Short Term/Long Term Goal(s): detox, elimination of symptoms of psychosis, medication management for mood stabilization; elimination of SI thoughts; development of comprehensive mental wellness/sobriety plan.  Update 06/07/2024:  No changes at this time.  Update 06/12/2024: No changes at this time.    Patient Goals:  No goals.  I just had a baby.Update 06/07/2024:  No changes at this time. Update 06/12/2024: No changes at this time.    Discharge Plan or Barriers: CSW to assist with development of appropriate discharge plans. Update 06/07/2024:  Patients husband reports that he would like for the patient to return home once she is taking her injection and has allowed CSW team to schedule a follow up pysch appointment.  Patient is declining to take her medication and declining to allow CSW to assist in getting appointment scheduled.   Update 06/12/2024: Patient continues to decline to take her injection though this is the recommendation of the family and medical providers.  Pt also declining aftercare referrals.     Reason for Continuation of Hospitalization: Aggression Anxiety Depression Medication stabilization Suicidal ideation   Estimated Length of Stay: 1-7 days Update 06/07/2024:  TBD Update 06/12/2024: TBD  Last 3 [place] Suicide Severity Risk Score: Flowsheet Row Admission (Current) from 05/31/2024 in [facility] ED from 11/29/2023 in [facility] Emergency Department at [facility] Clinical Support from 01/27/2023 in [facility]  C-SSRS RISK  CATEGORY No Risk No Risk No Risk    Last PHQ 2/9 Scores:    01/27/2023    4:36 PM 11/17/2022    3:18 PM 08/27/2022    3:43 PM  Depression screen PHQ 2/9  Decreased Interest 0 0 1  Down, Depressed, Hopeless 0 0 0  PHQ - 2 Score 0 0 1    Scribe for Treatment Team: Sherryle JINNY Margo, KEN 06/12/2024 3:59 PM

## 2024-06-12 NOTE — Group Note (Signed)
 Date:  06/12/2024 Time:  8:30 PM  Additional Comments:  Did not attend group.  Butler LITTIE Gelineau 06/12/2024, 8:30 PM

## 2024-06-12 NOTE — Group Note (Signed)
 Recreation Therapy Group Note   Group Topic:Coping Skills  Group Date: 06/12/2024 Start Time: 1010 End Time: 1050 Facilitators: Celestia Jeoffrey BRAVO, LRT, CTRS Location: Craft Room  Group Description: Mind Map.  Patient was provided a blank template of a diagram with 32 blank boxes in a tiered system, branching from the center (similar to a bubble chart). LRT directed patients to label the middle of the diagram Coping Skills. LRT and patients then came up with 8 different coping skills as examples. Pt were directed to record their coping skills in the 2nd tier boxes closest to the center.  Patients would then share their coping skills with the group as LRT wrote them out. LRT gave a handout of 99 different coping skills at the end of group.   Goal Area(s) Addressed: Patients will be able to define coping skills. Patient will identify new coping skills.  Patient will increase communication.   Affect/Mood: N/A   Participation Level: Did not attend    Clinical Observations/Individualized Feedback: Patient did not attend.   Plan: Continue to engage patient in RT group sessions 2-3x/week.   Jeoffrey BRAVO Celestia, LRT, CTRS 06/12/2024 11:09 AM

## 2024-06-12 NOTE — Plan of Care (Signed)
" °  Problem: Education: Goal: Mental status will improve Outcome: Progressing   Problem: Coping: Goal: Ability to verbalize frustrations and anger appropriately will improve Outcome: Progressing   Problem: Clinical Measurements: Goal: Ability to maintain clinical measurements within normal limits will improve Outcome: Progressing Goal: Diagnostic test results will improve Outcome: Progressing   Problem: Elimination: Goal: Will not experience complications related to bowel motility Outcome: Progressing   "

## 2024-06-12 NOTE — [facility] Counselor (Signed)
 CSW attempted to call the patient's husband wit the use of interpreter, [phone] to discuss need for family meeting and if the father can come to the hospital on Thursday 06/15/2023 to discuss medications.   CSW left HIPAA compliant voicemail requesting a return phone call.    Sherryle Margo, MSW, LCSW 06/12/2024 3:21 PM

## 2024-06-12 NOTE — Progress Notes (Signed)
" °   06/12/24 0900  Psych Admission Type (Psych Patients Only)  Admission Status Involuntary  Psychosocial Assessment  Patient Complaints None  Eye Contact Brief  Facial Expression Animated  Affect Preoccupied  Speech Logical/coherent  Interaction Assertive  Motor Activity Slow  Appearance/Hygiene Unremarkable  Behavior Characteristics Cooperative  Mood Pleasant  Aggressive Behavior  Effect No apparent injury  Thought Process  Coherency Concrete thinking  Content Preoccupation  Delusions None reported or observed  Perception WDL  Hallucination None reported or observed  Judgment Impaired  Confusion None  Danger to Self  Current suicidal ideation? Denies  Agreement Not to Harm Self Yes  Description of Agreement verbal  Danger to Others  Danger to Others None reported or observed    "

## 2024-06-12 NOTE — Progress Notes (Signed)
 " [facility] MD Progress Note  06/12/2024 12:47 PM Amy Mcclain  MRN:  [id number]   Subjective:  Chart reviewed, case discussed in multidisciplinary meeting, patient seen during rounds.   06/12/24: Patient seen with interpreter and discussed overall care in length. Patient initially able to engage with linear, logical thought process, but through conversation expressed that a investigator told her not to take her medication for reasons related to magic. This was clarified several times by the interpreter. She was able to identify previously receiving a LAI from an outpatient clinic, reporting she took medicine and was cured.   Patient still endorses she is not here for psychiatric concerns, but believes she is for the 6 weeks after her delivery. She expresses desire to be with her child. She expresses having discussed with family that she needs to come home. She expresses positive communication with them. She denies SI, HI, and AVH. She did take the Abilify  oral tablet last night.   Although patient appears to have more engagement and compliance with medications, she continues to show delusions and poor insight into current mental health condition.   06/11/24: Patient seen today with interpreter. She reports he is good. She continues to express some paranoia, not trusting the vasoline from the hospital, not feeling like she needs medication. She expresses that doctors have told her she can go home and that she no longer needs any medication; medical or psychiatric. She is cooperative and pleasant in engaging with clinical research associate. She is discharged focused.   She denies SI, HI, and AVH.   06/09/2024: Patient is assessed today on the inpatient unit with face to face interpreter. Explains that she does not need medication that this hospital is providing her. Although she states that the medication that she was receiving from her outpatient provider was beneficial, she does not believe that she is receiving the  same medication here. She continues to display disorganized thinking and that the chemicals are not good for her. Patient continues to keep a bandage that was placed after she received an injection, stating that it is not good for her. She is discharge focused that she needs to return home to her infant that he receiving formula although family has confirmed that she is not breastfeeding the child. She denies all psychiatric symptoms at this time.   06/08/24: Patient was interviewed with face-to-face interpreter.  Patient continues to lack insight into her mental health problems.  Per nursing patient has declined to take any of her medication.  Provider explained the need for her to take psychotropic medications at least Zyprexa  to help with her mood.  Patient reports that she is not diagnosed with any mental health problems and she does not need to take any medications.  She denies SI/HI/plan and denies hallucinations but displays disorganized thought process.     06/07/24: Patient is noted to be in the room with the translator.  Unfortunately the translator was not translating but summarizing the patient's statements and asking the provider questions about her treatment rather than translation.  Patient continues to display lack of insight into her mental health problems.  She starts blaming the medication saying she is receiving medications for constipation when she does not need it and reports that the medication she is taking is giving her side effects of breast tenderness and dry nose.  Patient is taking Zyprexa  but continues to state that she does not need any medications.  Provider explained multiple times that her family including her husband and her  70 year old daughter expressed significant concerns to the team about patient being out at baseline and patient holding a long history of refusing all treatment and stopping medications when she gets discharged to home.  Patient denies SI/HI and hallucinations.   Patient displays disorganized thought processes.     06/06/24: Patient is interviewed today in the presence of interpreter.  Patient denies SI/HI/hallucinations.  When provider tried to discuss long-acting injectable that she was taking in the past patient brushed it off saying she does not have any diagnosis of schizophrenia and that her doctor told her she does not need any shots anymore.  Later on she says she will not take any LAI's until 6 months after her discharge.  Patient lacks insight into her mental health problems.  Patient is taking the oral Zyprexa .  She denies any side effects to the medications.     1/6: Interpreter was used face-to-face during this interview.  Patient continues to minimize her mental health presentation, remains discharge focused stating her baby is waiting for her and patient wants to breast-feed the baby.  She is unable to acknowledge the events that led up to the hospitalization.  She denies SI/HI/plan and denies hallucinations.  Social work team and this provider reached out to patient's husband through interpreter and patient husband continues to express his concern that patient is still displaying paranoia and disorganized thought process and is not at her baseline.  Has been informed the team that patient was getting LAI but is unable to recall the name of the medication..  He agreed to provide contact details of patient's psychiatrist outpatient and the name of the medication so that patient can be restarted on it.  Family is not comfortable with patient discharge at this time.   1/5: Patient is seen today with a virtual interpreter.  Patient speaks Arabic.  When asked about the reasons for her hospitalization patient is able to respond that she went to her hospital to have a baby and baby went home but patient was brought to the psychiatric hospital.  She reports living with husband and her children.  She denies any abuse or any stressors at home.  She remains  discharge focused and wants to go home stating that the baby is on formula now but she wants to go breast-feed the baby.  She denies feeling depressed or sad.  She reports that she is just waiting for the time when her family visits her.  She reports fair appetite and sleep.  Per treatment team patient is responding to Zyprexa  but still has residual disorganized thought process at times.  Patient wanted to know the medication name that she is taking.  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   1/4: Patient seen in the presence of an interpreter. She reports that she is feeling normal and that she doesn't want to take medication. She reports the medication given last night made her sleep too long. When asked how long she slept, she reports she slept from 9 PM to 3 AM. She then reports that her normal sleeping pattern is like a normal person  from around 9 PM to 8  AM. Discussed that she has the option of taking oral medication as opposed to the shot if she felt like the shot made her sleep too long (Although it was brought to her attention that sleeping 9 PM to 3 AM was six hours). She then  reported to writer that the injection last night was enough and she doesn't need anymore. She would like to go home and she can come back and follow up with psychiatry if needed in the future. Discussed that that is not currently an option as she is under involuntary commitment and she reported that doctors told her she was fine and was no longer required to stay (Of which is not accurate). She denies SI, HI, AVH. She also denies ever having any psychiatric diagnoses. She then ends the assessment and gets on the phone to call her husband. She was not observed to be upset on the phone with her husband, but was able to return to her room and self regulate.   Patient continues to show delusional thinking. Her mood is irritable and her  affect is blunt. She continues to deny medications and deny a history of a psychiatric disorder. Due to ongoing symptoms of psychosis, the patient continues to meet criteria for involuntary commitment.   1/3: Patient seen in the presence of an interpreter.    Initially, she reported that she is done with the doctors. She then was OK speaking to clinical research associate. She reports she has taken all of the medication that she needs and needs no more. She reports that the doctor told her she does not have to take medication. She is unable to identify which doctor this was.     Although being told several times that patient would be staying in the hospital she continue to state that she is being discharged with her husband. At one point stating that she has already been discharged. She denies SI, HI, and AVH. Her thought process continues to contain delusions and is disorganized.    When discussing potential for forced medication, patient did not comprehend the information that was being told to her. Attempted to discuss taking oral medication and patient continue to decline. She continues to state that she does not need to see psychiatry.    MD spoke with family. Family reported that patient has been off of her medication for some time. They report she does much better when she is on medication and that this is not her baseline. They would like her to get restarted back on medication. Please review MD note discussion forced medication and family discussion. Family confirmed to nursing that patient is not breastfeeding and that the infant is taking formula.   Past Psychiatric History: see h&P Family History:  Family History  Problem Relation Age of Onset   Hypertension Mother    Diabetes Mother    Diabetes Father    Hypertension Father    Social History:  Social History   Substance and Sexual Activity  Alcohol Use No   Alcohol/week: 0.0 standard drinks of alcohol     Social History   Substance and Sexual  Activity  Drug Use No    Social History   Socioeconomic History   Marital status: Married    Spouse name: Not on file   Number of children: Not on file   Years of education: Not on file   Highest education level: Not on file  Occupational History   Not on file  Tobacco Use   Smoking status: Never   Smokeless tobacco: Never  Vaping Use   Vaping status: Never Used  Substance and Sexual Activity   Alcohol use: No    Alcohol/week: 0.0 standard drinks of alcohol   Drug use: No   Sexual activity: Yes  Other Topics  Concern   Not on file  Social History Narrative   Not on file   Social Drivers of Health   Tobacco Use: Low Risk (05/31/2024)   Patient History    Smoking Tobacco Use: Never    Smokeless Tobacco Use: Never    Passive Exposure: Not on file  Financial Resource Strain: Not on file  Food Insecurity: No Food Insecurity (05/31/2024)   Epic    Worried About Programme Researcher, Broadcasting/film/video in the Last Year: Never true    Ran Out of Food in the Last Year: Never true  Transportation Needs: No Transportation Needs (05/31/2024)   Epic    Lack of Transportation (Medical): No    Lack of Transportation (Non-Medical): No  Physical Activity: Not on file  Stress: Not on file  Social Connections: Not on file  Depression (PHQ2-9): Low Risk (01/27/2023)   Depression (PHQ2-9)    PHQ-2 Score: 0  Alcohol Screen: Low Risk (05/31/2024)   Alcohol Screen    Last Alcohol Screening Score (AUDIT): 0  Housing: Low Risk (05/31/2024)   Epic    Unable to Pay for Housing in the Last Year: No    Number of Times Moved in the Last Year: 0    Homeless in the Last Year: No  Utilities: Not At Risk (05/31/2024)   Epic    Threatened with loss of utilities: No  Health Literacy: Not on file   Past Medical History:  Past Medical History:  Diagnosis Date   Anemia    Depression    Schizophrenia (HCC)     Past Surgical History:  Procedure Laterality Date   NO PAST SURGERIES      Current Medications: Current  Facility-Administered Medications  Medication Dose Route Frequency Provider Last Rate Last Admin   acetaminophen  (TYLENOL ) tablet 650 mg  650 mg Oral Q4H PRN Jacquetta Sharlot GRADE, NP       alum & mag hydroxide-simeth (MAALOX/MYLANTA) 200-200-20 MG/5ML suspension 30 mL  30 mL Oral Q4H PRN Jacquetta Sharlot GRADE, NP       ARIPiprazole  (ABILIFY ) tablet 5 mg  5 mg Oral QHS Kaybree Williams, NP   5 mg at 06/11/24 2107   Or   OLANZapine  (ZYPREXA ) injection 10 mg  10 mg Intramuscular QHS Xee Hollman, NP       benzocaine -Menthol  (DERMOPLAST) 20-0.5 % topical spray 1 Application  1 Application Topical PRN Lord, Jamison Y, NP       coconut oil  1 Application Topical PRN Jacquetta Sharlot GRADE, NP       witch hazel-glycerin  (TUCKS) pad 1 Application  1 Application Topical PRN Jacquetta Sharlot GRADE, NP       And   dibucaine (NUPERCAINAL) 1 % rectal ointment 1 Application  1 Application Rectal PRN Jacquetta Sharlot GRADE, NP       diphenhydrAMINE  (BENADRYL ) capsule 25 mg  25 mg Oral Q6H PRN Jacquetta Sharlot GRADE, NP       ferrous sulfate  tablet 325 mg  325 mg Oral Q1400 Jacquetta Sharlot GRADE, NP       ibuprofen  (ADVIL ) tablet 600 mg  600 mg Oral Q6H Jacquetta Sharlot GRADE, NP   600 mg at 06/11/24 0555   magnesium  hydroxide (MILK OF MAGNESIA) suspension 30 mL  30 mL Oral Daily PRN Jacquetta Sharlot GRADE, NP       OLANZapine  (ZYPREXA ) injection 5 mg  5 mg Intramuscular TID PRN Jacquetta Sharlot GRADE, NP       prenatal multivitamin tablet 1 tablet  1  tablet Oral Q1200 Jacquetta Sharlot GRADE, NP       senna-docusate (Senokot-S) tablet 2 tablet  2 tablet Oral Daily Jacquetta Sharlot GRADE, NP       simethicone  [facility]) chewable tablet 80 mg  80 mg Oral PRN Lord, Jamison Y, NP       zolpidem  (AMBIEN ) tablet 5 mg  5 mg Oral QHS PRN Lord, Jamison Y, NP   5 mg at 06/11/24 2107    Lab Results: No results found for this or any previous visit (from the past 48 hours).  Blood Alcohol level:  Lab Results  Component Value Date   [facility] <15 11/29/2023   ETH <10 10/11/2017    Metabolic Disorder  Labs: Lab Results  Component Value Date   HGBA1C 5.0 05/29/2024   MPG 96.8 05/29/2024   MPG 91.06 10/18/2017   Lab Results  Component Value Date   PROLACTIN 166.7 (H) 10/18/2017   Lab Results  Component Value Date   CHOL 311 (H) 05/29/2024   TRIG 196 (H) 05/29/2024   HDL 55 05/29/2024   CHOLHDL 5.7 05/29/2024   VLDL 39 05/29/2024   LDLCALC 217 (H) 05/29/2024   LDLCALC 102 (H) 10/18/2017    Physical Findings: AIMS:  , ,  ,  ,    CIWA:    COWS:      Psychiatric Specialty Exam:  Presentation  General Appearance:  Appropriate for Environment; Casual  Eye Contact: Fair  Speech: Clear and Coherent; Normal Rate  Speech Volume: Normal    Mood and Affect  Mood: Euthymic  Affect: Blunt   Thought Process  Thought Processes: Coherent; Linear  Orientation:Full (Time, Place and Person)  Thought Content:Delusions  Hallucinations:Hallucinations: None  Ideas of Reference:Delusions  Suicidal Thoughts:Suicidal Thoughts: No  Homicidal Thoughts:Homicidal Thoughts: No   Sensorium  Memory: Immediate Fair; Recent Fair  Judgment: Poor  Insight: Lacking   Executive Functions  Concentration: Fair  Attention Span: Fair  Recall: Fair  Fund of Knowledge: Fair  Language: Fair   Psychomotor Activity  Psychomotor Activity:Psychomotor Activity: Normal  Musculoskeletal: Strength & Muscle Tone: within normal limits Gait & Station: normal Assets  Assets: Manufacturing Systems Engineer; Housing; Social Support    Physical Exam: Physical Exam Vitals and nursing note reviewed.  Constitutional:      Appearance: Normal appearance.  Pulmonary:     Effort: Pulmonary effort is normal.  Neurological:     Mental Status: She is alert and oriented to person, place, and time.    Review of Systems  Respiratory:  Negative for shortness of breath.   Cardiovascular:  Negative for chest pain.  Gastrointestinal:  Negative for diarrhea, nausea and vomiting.   Psychiatric/Behavioral:  Negative for depression, hallucinations, substance abuse and suicidal ideas.   All other systems reviewed and are negative.  Blood pressure 106/80, pulse 85, temperature 97.9 F (36.6 C), temperature source Oral, resp. rate 16, height 4' 4 (1.321 m), weight 54 kg, last menstrual period 08/04/2023, SpO2 100%, unknown if currently breastfeeding. Body mass index is 30.94 kg/m.  Diagnosis: Principal Problem:   Schizophrenia, undifferentiated (HCC)   PLAN: Safety and Monitoring:  -- Involuntary admission to inpatient psychiatric unit for safety, stabilization and treatment  -- Daily contact with patient to assess and evaluate symptoms and progress in treatment  -- Patient's case to be discussed in multi-disciplinary team meeting  -- Observation Level : q15 minute checks  -- Vital signs:  q12 hours  -- Precautions: suicide, elopement, and assault -- Encouraged patient to participate in  unit milieu and in scheduled group therapies  2. Psychiatric Treatment:  Scheduled Medications:  Chart review shows previous LAI initiation of Abilify  Maintena in 2024 after D/C of Invega  due to elevated prolactin levels.   Will start Abilify  5 mg (forced medication linked to IM Zyprexa  if refused) orally daily with plan to titrate to 30 mg oral and begin LAI (if patient agrees).    -- The risks/benefits/side-effects/alternatives to this medication were discussed in detail with the patient and time was given for questions. The patient consents to medication trial.   3. Medical Issues Being Addressed:  No current medical issues expressed Patient declining vitamins at this time   4. Discharge Planning:   -- Social work and case management to assist with discharge planning and identification of hospital follow-up needs prior to discharge  -- Estimated LOS: Unable to determine at this time  Laiylah Roettger, NP 06/12/2024, 12:47 PM  "

## 2024-06-12 NOTE — Plan of Care (Signed)
   Problem: Education: Goal: Emotional status will improve Outcome: Progressing Goal: Mental status will improve Outcome: Progressing

## 2024-06-12 NOTE — Plan of Care (Signed)
" °  Problem: Education: Goal: Mental status will improve Outcome: Progressing   Problem: Education: Goal: Emotional status will improve Outcome: Progressing   Problem: Education: Goal: Verbalization of understanding the information provided will improve Outcome: Progressing   Problem: Safety: Goal: Periods of time without injury will increase Outcome: Progressing   Problem: Nutrition: Goal: Adequate nutrition will be maintained Outcome: Progressing   Problem: Coping: Goal: Level of anxiety will decrease Outcome: Progressing   "

## 2024-06-12 NOTE — Group Note (Signed)
 Date:  06/12/2024 Time:  3:58 PM  Group Topic/Focus:  Goals Group:   The focus of this group is to help patients establish daily goals to achieve during treatment and discuss how the patient can incorporate goal setting into their daily lives to aide in recovery.    Participation Level:  Did Not Attend  Amy Mcclain 06/12/2024, 3:58 PM

## 2024-06-12 NOTE — Group Note (Signed)
 LCSW Group Therapy Note  Group Date: 06/12/2024 Start Time: 1245 End Time: 1340   Type of Therapy and Topic:  Group Therapy: Positive Affirmations  Participation Level:  Did Not Attend   Description of Group:   This group addressed positive affirmation towards self and others.  Patients went around the room and identified two positive things about themselves and two positive things about a peer in the room.  Patients reflected on how it felt to share something positive with others, to identify positive things about themselves, and to hear positive things from others/ Patients were encouraged to have a daily reflection of positive characteristics or circumstances.   Therapeutic Goals: Patients will verbalize two of their positive qualities Patients will demonstrate empathy for others by stating two positive qualities about a peer in the group Patients will verbalize their feelings when voicing positive self affirmations and when voicing positive affirmations of others Patients will discuss the potential positive impact on their wellness/recovery of focusing on positive traits of self and others.  Summary of Patient Progress:  Patient did not attend.   Therapeutic Modalities:   Cognitive Behavioral Therapy Motivational Interviewing    Alveta CHRISTELLA Kerns, LCSWA 06/12/2024  1:40 PM

## 2024-06-13 MED ORDER — OLANZAPINE 10 MG IM SOLR
10.0000 mg | Freq: Every day | INTRAMUSCULAR | Status: DC
  Filled 2024-06-13: qty 10

## 2024-06-13 MED ORDER — ARIPIPRAZOLE 10 MG PO TABS
10.0000 mg | ORAL_TABLET | Freq: Every day | ORAL | Status: DC
  Administered 2024-06-13: 10 mg via ORAL
  Filled 2024-06-13: qty 1

## 2024-06-13 NOTE — Progress Notes (Addendum)
 06/13/2024 Letter to the Court  Reg: Amy Mcclain DOB: 08/26/1985  Amy Mcclain has been hospitalized since 05/31/2024 at [facility]) inpatient psychiatric unit. She is currently being treated for Schizophrenia and is currently post partum. Patient remains delusional about chemicals hurting her and refusing to take any medications. Family is concerned about the safety of the newborn baby if patient remains psychotic and refuses to treatment.   Patient is unable to make medical decisions at this time and we will be initiating nonemergent forced medication.  As this process is time consuming patient has no safe place to go with out supervision.   We appreciate the court's assistance in stabilizing and currently we request another 30 days of inpatient care to further stabilize.  Sincerely, Jakeia Carreras.MD [facility]

## 2024-06-13 NOTE — Group Note (Signed)
 Date:  06/13/2024 Time:  12:22 PM  Group Topic/Focus:  Building Self Esteem:   The Focus of this group is helping patients become aware of the effects of self-esteem on their lives, the things they and others do that enhance or undermine their self-esteem, seeing the relationship between their level of self-esteem and the choices they make and learning ways to enhance self-esteem.    Participation Level:  Did Not Attend   Clayborne DELENA June 06/13/2024, 12:22 PM

## 2024-06-13 NOTE — Progress Notes (Addendum)
" °   06/13/24 0859  Psych Admission Type (Psych Patients Only)  Admission Status Involuntary  Psychosocial Assessment  Patient Complaints None  Eye Contact Fair  Facial Expression Other (Comment) (WDL)  Affect Appropriate to circumstance  Speech Language other than English  Interaction Minimal  Motor Activity Other (Comment) (WDL)  Appearance/Hygiene Unremarkable  Behavior Characteristics Appropriate to situation;Calm  Mood Pleasant  Thought Process  Coherency Circumstantial  Content Preoccupation  Delusions None reported or observed  Perception WDL  Hallucination None reported or observed  Judgment Impaired  Confusion None  Danger to Self  Current suicidal ideation? Denies  Agreement Not to Harm Self Yes  Description of Agreement verbal  Danger to Others  Danger to Others None reported or observed   D- Patient alert and oriented x 2. Pt disoriented to situation and location. Pt presents with a pleasant moo d and affect. Pt preoccupied with discharge. Pt states she is to be discharged today. No pending discharged noted as of yet. Pt informed. Pt denies SI, HI, AVH, and pain.   A- pt declines scheduled medications. Support and encouragement provided.  Routine safety checks conducted every 15 minutes.  Patient informed to notify staff with problems or concerns.  R- No adverse drug reactions noted. Patient contracts for safety at this time. Patient compliant with medications and treatment plan. Patient receptive, calm, and cooperative. Patient did not interact  with others on the unit except during meals.  Patient remains safe at this time.    Latori Beggs S.,RN "

## 2024-06-13 NOTE — Group Note (Signed)
 [facility] LCSW Group Therapy Note   Group Date: 06/13/2024 Start Time: 1300 End Time: 1400   Type of Therapy/Topic:  Group Therapy:  Emotion Regulation  Participation Level:  Did Not Attend   Mood:  Description of Group:    The purpose of this group is to assist patients in learning to regulate negative emotions and experience positive emotions. Patients will be guided to discuss ways in which they have been vulnerable to their negative emotions. These vulnerabilities will be juxtaposed with experiences of positive emotions or situations, and patients challenged to use positive emotions to combat negative ones. Special emphasis will be placed on coping with negative emotions in conflict situations, and patients will process healthy conflict resolution skills.  Therapeutic Goals: Patient will identify two positive emotions or experiences to reflect on in order to balance out negative emotions:  Patient will label two or more emotions that they find the most difficult to experience:  Patient will be able to demonstrate positive conflict resolution skills through discussion or role plays:   Summary of Patient Progress:   X    Therapeutic Modalities:   Cognitive Behavioral Therapy Feelings Identification Dialectical Behavioral Therapy   Sherryle JINNY Margo, LCSW

## 2024-06-13 NOTE — Plan of Care (Signed)
  Problem: Education: Goal: Knowledge of [facility] information/materials will improve Outcome: Progressing Goal: Mental status will improve Outcome: Progressing Goal: Verbalization of understanding the information provided will improve Outcome: Progressing   Problem: Activity: Goal: Interest or engagement in activities will improve Outcome: Progressing

## 2024-06-13 NOTE — Progress Notes (Signed)
" °   06/13/24 2100  Psych Admission Type (Psych Patients Only)  Admission Status Involuntary  Psychosocial Assessment  Eye Contact Brief  Facial Expression Animated  Affect Preoccupied  Speech Logical/coherent  Interaction Assertive  Motor Activity Slow  Appearance/Hygiene Unremarkable  Behavior Characteristics Cooperative;Calm  Mood Pleasant  Thought Process  Coherency Concrete thinking  Content Preoccupation  Delusions None reported or observed  Perception WDL  Hallucination None reported or observed  Judgment Impaired  Confusion None  Danger to Self  Current suicidal ideation? Denies  Agreement Not to Harm Self Yes  Description of Agreement verbal  Danger to Others  Danger to Others None reported or observed    "

## 2024-06-13 NOTE — Plan of Care (Signed)
" °  Problem: Education: Goal: Mental status will improve Outcome: Not Met (add Reason) Goal: Verbalization of understanding the information provided will improve Outcome: Progressing   Problem: Activity: Goal: Interest or engagement in activities will improve Outcome: Not Met (add Reason)   Problem: Coping: Goal: Ability to verbalize frustrations and anger appropriately will improve Outcome: Progressing   Problem: Safety: Goal: Periods of time without injury will increase Outcome: Progressing   "

## 2024-06-13 NOTE — Group Note (Signed)
 Date:  06/13/2024 Time:  8:23 PM  Group Topic/Focus:  Rediscovering Joy:   The focus of this group is to explore various ways to relieve stress in a positive manner.    Participation Level:  Did Not Attend  Participation Quality:  none  Affect:  none  Cognitive:  none  Insight: None  Engagement in Group:  none  Modes of Intervention:  none  Additional Comments:  none   Demara Lover 06/13/2024, 8:23 PM

## 2024-06-13 NOTE — Progress Notes (Signed)
 " [facility] MD Progress Note  06/13/2024 10:37 PM Amy Mcclain  MRN:  [id number]   Subjective:  Chart reviewed, case discussed in multidisciplinary meeting, patient seen during rounds.   06/13/24: Patient cooperative and engages with clinical research associate. She declines SI, HI, and AVH. She denies concerns at this time. She continues to request discharge, reporting she was told she was going home. She continues to deny need for medications, but has been medication compliant with abilify . She continues to state she does not need the injection (LAI) and does not think her family wants her to take it.   Writer attempted to contact daughter with no answer.   06/12/24: Patient seen with interpreter and discussed overall care in length. Patient initially able to engage with linear, logical thought process, but through conversation expressed that a investigator told her not to take her medication for reasons related to magic. This was clarified several times by the interpreter. She was able to identify previously receiving a LAI from an outpatient clinic, reporting she took medicine and was cured.   Patient still endorses she is not here for psychiatric concerns, but believes she is for the 6 weeks after her delivery. She expresses desire to be with her child. She expresses having discussed with family that she needs to come home. She expresses positive communication with them. She denies SI, HI, and AVH. She did take the Abilify  oral tablet last night.   Although patient appears to have more engagement and compliance with medications, she continues to show delusions and poor insight into current mental health condition.   06/11/24: Patient seen today with interpreter. She reports he is good. She continues to express some paranoia, not trusting the vasoline from the hospital, not feeling like she needs medication. She expresses that doctors have told her she can go home and that she no longer needs any medication;  medical or psychiatric. She is cooperative and pleasant in engaging with clinical research associate. She is discharged focused.   She denies SI, HI, and AVH.   06/09/2024: Patient is assessed today on the inpatient unit with face to face interpreter. Explains that she does not need medication that this hospital is providing her. Although she states that the medication that she was receiving from her outpatient provider was beneficial, she does not believe that she is receiving the same medication here. She continues to display disorganized thinking and that the chemicals are not good for her. Patient continues to keep a bandage that was placed after she received an injection, stating that it is not good for her. She is discharge focused that she needs to return home to her infant that he receiving formula although family has confirmed that she is not breastfeeding the child. She denies all psychiatric symptoms at this time.   06/08/24: Patient was interviewed with face-to-face interpreter.  Patient continues to lack insight into her mental health problems.  Per nursing patient has declined to take any of her medication.  Provider explained the need for her to take psychotropic medications at least Zyprexa  to help with her mood.  Patient reports that she is not diagnosed with any mental health problems and she does not need to take any medications.  She denies SI/HI/plan and denies hallucinations but displays disorganized thought process.     06/07/24: Patient is noted to be in the room with the translator.  Unfortunately the translator was not translating but summarizing the patient's statements and asking the provider questions about her treatment rather than translation.  Patient continues to display lack of insight into her mental health problems.  She starts blaming the medication saying she is receiving medications for constipation when she does not need it and reports that the medication she is taking is giving her side  effects of breast tenderness and dry nose.  Patient is taking Zyprexa  but continues to state that she does not need any medications.  Provider explained multiple times that her family including her husband and her 42 year old daughter expressed significant concerns to the team about patient being out at baseline and patient holding a long history of refusing all treatment and stopping medications when she gets discharged to home.  Patient denies SI/HI and hallucinations.  Patient displays disorganized thought processes.     06/06/24: Patient is interviewed today in the presence of interpreter.  Patient denies SI/HI/hallucinations.  When provider tried to discuss long-acting injectable that she was taking in the past patient brushed it off saying she does not have any diagnosis of schizophrenia and that her doctor told her she does not need any shots anymore.  Later on she says she will not take any LAI's until 6 months after her discharge.  Patient lacks insight into her mental health problems.  Patient is taking the oral Zyprexa .  She denies any side effects to the medications.     1/6: Interpreter was used face-to-face during this interview.  Patient continues to minimize her mental health presentation, remains discharge focused stating her baby is waiting for her and patient wants to breast-feed the baby.  She is unable to acknowledge the events that led up to the hospitalization.  She denies SI/HI/plan and denies hallucinations.  Social work team and this provider reached out to patient's husband through interpreter and patient husband continues to express his concern that patient is still displaying paranoia and disorganized thought process and is not at her baseline.  Has been informed the team that patient was getting LAI but is unable to recall the name of the medication..  He agreed to provide contact details of patient's psychiatrist outpatient and the name of the medication so that patient can be  restarted on it.  Family is not comfortable with patient discharge at this time.   1/5: Patient is seen today with a virtual interpreter.  Patient speaks Arabic.  When asked about the reasons for her hospitalization patient is able to respond that she went to her hospital to have a baby and baby went home but patient was brought to the psychiatric hospital.  She reports living with husband and her children.  She denies any abuse or any stressors at home.  She remains discharge focused and wants to go home stating that the baby is on formula now but she wants to go breast-feed the baby.  She denies feeling depressed or sad.  She reports that she is just waiting for the time when her family visits her.  She reports fair appetite and sleep.  Per treatment team patient is responding to Zyprexa  but still has residual disorganized thought process at times.  Patient wanted to know the medication name that she is taking.  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   1/4: Patient seen in the presence of an interpreter. She reports that she is feeling normal and that she doesn't want to take medication. She reports the medication given last night made her sleep too long. When asked how long  she slept, she reports she slept from 9 PM to 3 AM. She then reports that her normal sleeping pattern is like a normal person  from around 9 PM to 8  AM. Discussed that she has the option of taking oral medication as opposed to the shot if she felt like the shot made her sleep too long (Although it was brought to her attention that sleeping 9 PM to 3 AM was six hours). She then reported to writer that the injection last night was enough and she doesn't need anymore. She would like to go home and she can come back and follow up with psychiatry if needed in the future. Discussed that that is not currently an option as she is under involuntary  commitment and she reported that doctors told her she was fine and was no longer required to stay (Of which is not accurate). She denies SI, HI, AVH. She also denies ever having any psychiatric diagnoses. She then ends the assessment and gets on the phone to call her husband. She was not observed to be upset on the phone with her husband, but was able to return to her room and self regulate.   Patient continues to show delusional thinking. Her mood is irritable and her affect is blunt. She continues to deny medications and deny a history of a psychiatric disorder. Due to ongoing symptoms of psychosis, the patient continues to meet criteria for involuntary commitment.   1/3: Patient seen in the presence of an interpreter.    Initially, she reported that she is done with the doctors. She then was OK speaking to clinical research associate. She reports she has taken all of the medication that she needs and needs no more. She reports that the doctor told her she does not have to take medication. She is unable to identify which doctor this was.     Although being told several times that patient would be staying in the hospital she continue to state that she is being discharged with her husband. At one point stating that she has already been discharged. She denies SI, HI, and AVH. Her thought process continues to contain delusions and is disorganized.    When discussing potential for forced medication, patient did not comprehend the information that was being told to her. Attempted to discuss taking oral medication and patient continue to decline. She continues to state that she does not need to see psychiatry.    MD spoke with family. Family reported that patient has been off of her medication for some time. They report she does much better when she is on medication and that this is not her baseline. They would like her to get restarted back on medication. Please review MD note discussion forced medication and family discussion.  Family confirmed to nursing that patient is not breastfeeding and that the infant is taking formula.   Past Psychiatric History: see h&P Family History:  Family History  Problem Relation Age of Onset   Hypertension Mother    Diabetes Mother    Diabetes Father    Hypertension Father    Social History:  Social History   Substance and Sexual Activity  Alcohol Use No   Alcohol/week: 0.0 standard drinks of alcohol     Social History   Substance and Sexual Activity  Drug Use No    Social History   Socioeconomic History   Marital status: Married    Spouse name: Not on file   Number of children: Not on  file   Years of education: Not on file   Highest education level: Not on file  Occupational History   Not on file  Tobacco Use   Smoking status: Never   Smokeless tobacco: Never  Vaping Use   Vaping status: Never Used  Substance and Sexual Activity   Alcohol use: No    Alcohol/week: 0.0 standard drinks of alcohol   Drug use: No   Sexual activity: Yes  Other Topics Concern   Not on file  Social History Narrative   Not on file   Social Drivers of Health   Tobacco Use: Low Risk (05/31/2024)   Patient History    Smoking Tobacco Use: Never    Smokeless Tobacco Use: Never    Passive Exposure: Not on file  Financial Resource Strain: Not on file  Food Insecurity: No Food Insecurity (05/31/2024)   Epic    Worried About Programme Researcher, Broadcasting/film/video in the Last Year: Never true    Ran Out of Food in the Last Year: Never true  Transportation Needs: No Transportation Needs (05/31/2024)   Epic    Lack of Transportation (Medical): No    Lack of Transportation (Non-Medical): No  Physical Activity: Not on file  Stress: Not on file  Social Connections: Not on file  Depression (PHQ2-9): Low Risk (01/27/2023)   Depression (PHQ2-9)    PHQ-2 Score: 0  Alcohol Screen: Low Risk (05/31/2024)   Alcohol Screen    Last Alcohol Screening Score (AUDIT): 0  Housing: Low Risk (05/31/2024)   Epic     Unable to Pay for Housing in the Last Year: No    Number of Times Moved in the Last Year: 0    Homeless in the Last Year: No  Utilities: Not At Risk (05/31/2024)   Epic    Threatened with loss of utilities: No  Health Literacy: Not on file   Past Medical History:  Past Medical History:  Diagnosis Date   Anemia    Depression    Schizophrenia (HCC)     Past Surgical History:  Procedure Laterality Date   NO PAST SURGERIES      Current Medications: Current Facility-Administered Medications  Medication Dose Route Frequency Provider Last Rate Last Admin   acetaminophen  (TYLENOL ) tablet 650 mg  650 mg Oral Q4H PRN Jacquetta Sharlot GRADE, NP       alum & mag hydroxide-simeth (MAALOX/MYLANTA) 200-200-20 MG/5ML suspension 30 mL  30 mL Oral Q4H PRN Jacquetta Sharlot GRADE, NP       ARIPiprazole  (ABILIFY ) tablet 10 mg  10 mg Oral QHS Neiva Maenza, NP   10 mg at 06/13/24 2105   Or   OLANZapine  (ZYPREXA ) injection 10 mg  10 mg Intramuscular QHS Xander Jutras, NP       benzocaine -Menthol  (DERMOPLAST) 20-0.5 % topical spray 1 Application  1 Application Topical PRN Lord, Jamison Y, NP       coconut oil  1 Application Topical PRN Jacquetta Sharlot GRADE, NP       witch hazel-glycerin  (TUCKS) pad 1 Application  1 Application Topical PRN Jacquetta Sharlot GRADE, NP       And   dibucaine (NUPERCAINAL) 1 % rectal ointment 1 Application  1 Application Rectal PRN Jacquetta Sharlot GRADE, NP       diphenhydrAMINE  (BENADRYL ) capsule 25 mg  25 mg Oral Q6H PRN Jacquetta Sharlot GRADE, NP       ferrous sulfate  tablet 325 mg  325 mg Oral Q1400 Jacquetta Sharlot GRADE, NP  ibuprofen  (ADVIL ) tablet 600 mg  600 mg Oral Q6H Jacquetta Sharlot GRADE, NP   600 mg at 06/11/24 0555   magnesium  hydroxide (MILK OF MAGNESIA) suspension 30 mL  30 mL Oral Daily PRN Jacquetta Sharlot GRADE, NP       OLANZapine  (ZYPREXA ) injection 5 mg  5 mg Intramuscular TID PRN Lord, Jamison Y, NP       prenatal multivitamin tablet 1 tablet  1 tablet Oral Q1200 Jacquetta Sharlot GRADE, NP       senna-docusate  (Senokot-S) tablet 2 tablet  2 tablet Oral Daily Jacquetta Sharlot GRADE, NP       simethicone  (MYLICON) chewable tablet 80 mg  80 mg Oral PRN Lord, Jamison Y, NP       zolpidem  (AMBIEN ) tablet 5 mg  5 mg Oral QHS PRN Lord, Jamison Y, NP   5 mg at 06/13/24 2106    Lab Results: No results found for this or any previous visit (from the past 48 hours).  Blood Alcohol level:  Lab Results  Component Value Date   [facility] <15 11/29/2023   ETH <10 10/11/2017    Metabolic Disorder Labs: Lab Results  Component Value Date   HGBA1C 5.0 05/29/2024   MPG 96.8 05/29/2024   MPG 91.06 10/18/2017   Lab Results  Component Value Date   PROLACTIN 166.7 (H) 10/18/2017   Lab Results  Component Value Date   CHOL 311 (H) 05/29/2024   TRIG 196 (H) 05/29/2024   HDL 55 05/29/2024   CHOLHDL 5.7 05/29/2024   VLDL 39 05/29/2024   LDLCALC 217 (H) 05/29/2024   LDLCALC 102 (H) 10/18/2017    Physical Findings: AIMS:  , ,  ,  ,    CIWA:    COWS:      Psychiatric Specialty Exam:  Presentation  General Appearance:  Appropriate for Environment; Casual  Eye Contact: Fair  Speech: Clear and Coherent; Normal Rate  Speech Volume: Normal    Mood and Affect  Mood: Euthymic  Affect: Blunt   Thought Process  Thought Processes: Coherent; Linear  Orientation:Full (Time, Place and Person)  Thought Content:Delusions  Hallucinations:Hallucinations: None  Ideas of Reference:Delusions  Suicidal Thoughts:Suicidal Thoughts: No  Homicidal Thoughts:Homicidal Thoughts: No   Sensorium  Memory: Immediate Fair; Recent Fair  Judgment: Poor  Insight: Lacking   Executive Functions  Concentration: Fair  Attention Span: Fair  Recall: Fair  Fund of Knowledge: Fair  Language: Fair   Psychomotor Activity  Psychomotor Activity:Psychomotor Activity: Normal  Musculoskeletal: Strength & Muscle Tone: within normal limits Gait & Station: normal Assets  Assets: Manufacturing Systems Engineer;  Housing; Social Support    Physical Exam: Physical Exam Vitals and nursing note reviewed.  Constitutional:      Appearance: Normal appearance.  Pulmonary:     Effort: Pulmonary effort is normal.  Neurological:     Mental Status: She is alert and oriented to person, place, and time.    Review of Systems  Respiratory:  Negative for shortness of breath.   Cardiovascular:  Negative for chest pain.  Gastrointestinal:  Negative for diarrhea, nausea and vomiting.  Psychiatric/Behavioral:  Negative for depression, hallucinations, substance abuse and suicidal ideas.   All other systems reviewed and are negative.  Blood pressure 108/80, pulse 84, temperature 98.6 F (37 C), temperature source Oral, resp. rate 16, height 4' 4 (1.321 m), weight 54 kg, last menstrual period 08/04/2023, SpO2 100%, unknown if currently breastfeeding. Body mass index is 30.94 kg/m.  Diagnosis: Principal Problem:   Schizophrenia,  undifferentiated (HCC)   PLAN: Safety and Monitoring:  -- Involuntary admission to inpatient psychiatric unit for safety, stabilization and treatment  -- Daily contact with patient to assess and evaluate symptoms and progress in treatment  -- Patient's case to be discussed in multi-disciplinary team meeting  -- Observation Level : q15 minute checks  -- Vital signs:  q12 hours  -- Precautions: suicide, elopement, and assault -- Encouraged patient to participate in unit milieu and in scheduled group therapies  2. Psychiatric Treatment:  Scheduled Medications:  Chart review shows previous LAI initiation of Abilify  Maintena in 2024 after D/C of Invega  due to elevated prolactin levels.   Will increase Abilify  to 10 mg (forced medication linked to IM Zyprexa  if refused) orally daily with plan to titrate to 30 mg oral and begin LAI (if patient agrees).    -- The risks/benefits/side-effects/alternatives to this medication were discussed in detail with the patient and time was given for  questions. The patient consents to medication trial.   3. Medical Issues Being Addressed:  No current medical issues expressed Patient declining vitamins at this time   4. Discharge Planning:   -- Social work and case management to assist with discharge planning and identification of hospital follow-up needs prior to discharge  -- Estimated LOS: Unable to determine at this time  Social work to coordinate family coming in for LAI discussion/injection.   Asees Manfredi, NP 06/13/2024, 10:37 PM   "

## 2024-06-14 MED ORDER — ARIPIPRAZOLE 10 MG PO TABS
30.0000 mg | ORAL_TABLET | Freq: Every day | ORAL | Status: DC
  Administered 2024-06-14: 30 mg via ORAL
  Filled 2024-06-14: qty 3

## 2024-06-14 MED ORDER — OLANZAPINE 10 MG IM SOLR: 10.0000 mg | Freq: Every day | INTRAMUSCULAR | Status: DC

## 2024-06-14 NOTE — Progress Notes (Signed)
" °   06/14/24 0825  Psych Admission Type (Psych Patients Only)  Admission Status Involuntary  Psychosocial Assessment  Patient Complaints None  Eye Contact Brief  Facial Expression Flat  Affect Appropriate to circumstance  Speech Logical/coherent  Interaction No initiation  Motor Activity Slow  Appearance/Hygiene Unremarkable  Behavior Characteristics Cooperative;Calm  Mood Pleasant  Thought Process  Coherency Unable to assess;WDL  Content Preoccupation  Delusions None reported or observed  Perception WDL  Hallucination Visual (pt states to interpreter that pt sees [place] and smoke at times)  Judgment Impaired  Confusion Mild  Danger to Self  Agreement Not to Harm Self Yes  Description of Agreement verbal  Danger to Others  Danger to Others None reported or observed   D- Patient alert and oriented x 3. Pt disoriented to situation. Pt presents with a pleasant mood and affect.  Denies SI, HI, AVH, and pain. Pt states her goal is to be discharged. Potential discharge pending for next week per progression.   A- No scheduled medications administered to patient, pt declines all daytime medications. Support and encouragement provided.  Routine safety checks conducted every 15 minutes.  Patient informed to notify staff with problems or concerns.  R- No adverse drug reactions noted. Patient contracts for safety at this time. Patient compliant with medications and treatment plan. Patient receptive, calm, and cooperative. Patient did not interact  with others on the unit except during meal times.  Patient remains safe at this time.    Zniyah Midkiff S.,RN "

## 2024-06-14 NOTE — [facility] Counselor (Signed)
 CSW touched base with husband with use of interpreter [phone]. Meeting has been arranged for 3 PM. Directions have been provided to patient with use of translator to make travel easier given language barrier.   A team member will meet husband in the lobby at 3 PM 06/15/24 to make navigation easier.   This has been communicated to team.   Alveta Kerns, MSW, [facility] 06/14/2024 4:39 PM

## 2024-06-14 NOTE — Group Note (Signed)
 Recreation Therapy Group Note   Group Topic:Goal Setting  Group Date: 06/14/2024 Start Time: 1000 End Time: 1110 Facilitators: Celestia Jeoffrey BRAVO, LRT, CTRS Location: Craft Room  Group Description: Vision Boards. Patients were given many different magazines, a glue stick, markers, and a piece of cardstock paper. LRT and pts discussed the importance of having goals in life. LRT and pts discussed the difference between short-term and long-term goals, as well as what a SMART goal is. LRT encouraged pts to create a vision board, with images they picked and then cut out with safety scissors from the magazine, for themselves, that capture their short and long-term goals. LRT encouraged pts to show and explain their vision board to the group.   Goal Area(s) Addressed:  Patient will gain knowledge of short vs. long term goals.  Patient will identify goals for themselves. Patient will practice setting SMART goals. Patient will verbalize their goals to LRT and peers.   Affect/Mood: N/A   Participation Level: Did not attend    Clinical Observations/Individualized Feedback: Patient did not attend.  Plan: Continue to engage patient in RT group sessions 2-3x/week.   Jeoffrey BRAVO Celestia, LRT, CTRS 06/14/2024 11:22 AM

## 2024-06-14 NOTE — Progress Notes (Signed)
 [facility] MD Progress Note  06/14/2024 8:29 PM Amy Mcclain  MRN:  [id number]   Subjective:  Chart reviewed, case discussed in multidisciplinary meeting, patient seen during rounds.  06/14/2024: Patient is interviewed in the presence of one-to-one interpreter.  Patient remains discharge focused and reports having appointments and the need for her to go home as her baby is on formula and she wants to breast-feed.  Per nursing patient is taking her medications.  She continues to lack insight into her mental health problems.  Treatment team has arranged for her husband to come to the unit tomorrow and encouraged her to take the LAI Abilify  Maintena.  Patient denies SI/HI/plan denies hallucinations.  She is taking care of her ADLs. 06/08/24: Patient was interviewed with face-to-face interpreter.  Patient continues to lack insight into her mental health problems.  Per nursing patient has declined to take any of her medication.  Provider explained the need for her to take psychotropic medications at least Zyprexa  to help with her mood.  Patient reports that she is not diagnosed with any mental health problems and she does not need to take any medications.  She denies SI/HI/plan and denies hallucinations but displays disorganized thought process.    06/07/24: Patient is noted to be in the room with the translator.  Unfortunately the translator was not translating but summarizing the patient's statements and asking the provider questions about her treatment rather than translation.  Patient continues to display lack of insight into her mental health problems.  She starts blaming the medication saying she is receiving medications for constipation when she does not need it and reports that the medication she is taking is giving her side effects of breast tenderness and dry nose.  Patient is taking Zyprexa  but continues to state that she does not need any medications.  Provider explained multiple times that her family including  her husband and her 38 year old daughter expressed significant concerns to the team about patient being out at baseline and patient holding a long history of refusing all treatment and stopping medications when she gets discharged to home.  Patient denies SI/HI and hallucinations.  Patient displays disorganized thought processes.   06/06/24: Patient is interviewed today in the presence of interpreter.  Patient denies SI/HI/hallucinations.  When provider tried to discuss long-acting injectable that she was taking in the past patient brushed it off saying she does not have any diagnosis of schizophrenia and that her doctor told her she does not need any shots anymore.  Later on she says she will not take any LAI's until 6 months after her discharge.  Patient lacks insight into her mental health problems.  Patient is taking the oral Zyprexa .  She denies any side effects to the medications.   1/6: Interpreter was used face-to-face during this interview.  Patient continues to minimize her mental health presentation, remains discharge focused stating her baby is waiting for her and patient wants to breast-feed the baby.  She is unable to acknowledge the events that led up to the hospitalization.  She denies SI/HI/plan and denies hallucinations.  Social work team and this provider reached out to patient's husband through interpreter and patient husband continues to express his concern that patient is still displaying paranoia and disorganized thought process and is not at her baseline.  Has been informed the team that patient was getting LAI but is unable to recall the name of the medication..  He agreed to provide contact details of patient's psychiatrist outpatient and the name  of the medication so that patient can be restarted on it.  Family is not comfortable with patient discharge at this time.  1/5: Patient is seen today with a virtual interpreter.  Patient speaks Arabic.  When asked about the reasons for her  hospitalization patient is able to respond that she went to her hospital to have a baby and baby went home but patient was brought to the psychiatric hospital.  She reports living with husband and her children.  She denies any abuse or any stressors at home.  She remains discharge focused and wants to go home stating that the baby is on formula now but she wants to go breast-feed the baby.  She denies feeling depressed or sad.  She reports that she is just waiting for the time when her family visits her.  She reports fair appetite and sleep.  Per treatment team patient is responding to Zyprexa  but still has residual disorganized thought process at times.  Patient wanted to know the medication name that she is taking.  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  1/4: Patient seen in the presence of an interpreter. She reports that she is feeling normal and that she doesn't want to take medication. She reports the medication given last night made her sleep too long. When asked how long she slept, she reports she slept from 9 PM to 3 AM. She then reports that her normal sleeping pattern is like a normal person  from around 9 PM to 8  AM. Discussed that she has the option of taking oral medication as opposed to the shot if she felt like the shot made her sleep too long (Although it was brought to her attention that sleeping 9 PM to 3 AM was six hours). She then reported to writer that the injection last night was enough and she doesn't need anymore. She would like to go home and she can come back and follow up with psychiatry if needed in the future. Discussed that that is not currently an option as she is under involuntary commitment and she reported that doctors told her she was fine and was no longer required to stay (Of which is not accurate). She denies SI, HI, AVH. She also denies ever having any psychiatric diagnoses. She  then ends the assessment and gets on the phone to call her husband. She was not observed to be upset on the phone with her husband, but was able to return to her room and self regulate.  Patient continues to show delusional thinking. Her mood is irritable and her affect is blunt. She continues to deny medications and deny a history of a psychiatric disorder. Due to ongoing symptoms of psychosis, the patient continues to meet criteria for involuntary commitment.  1/3: Patient seen in the presence of an interpreter.   Initially, she reported that she is done with the doctors. She then was OK speaking to clinical research associate. She reports she has taken all of the medication that she needs and needs no more. She reports that the doctor told her she does not have to take medication. She is unable to identify which doctor this was.    Although being told several times that patient would be staying in the hospital she continue to state that she is being discharged with her husband. At one point stating that she has already been discharged. She denies SI, HI, and AVH. Her  thought process continues to contain delusions and is disorganized.   When discussing potential for forced medication, patient did not comprehend the information that was being told to her. Attempted to discuss taking oral medication and patient continue to decline. She continues to state that she does not need to see psychiatry.   MD spoke with family. Family reported that patient has been off of her medication for some time. They report she does much better when she is on medication and that this is not her baseline. They would like her to get restarted back on medication. Please review MD note discussion forced medication and family discussion. Family confirmed to nursing that patient is not breastfeeding and that the infant is taking formula.   Past Psychiatric History: see h&P Family History:  Family History  Problem Relation Age of Onset    Hypertension Mother    Diabetes Mother    Diabetes Father    Hypertension Father    Social History:  Social History   Substance and Sexual Activity  Alcohol Use No   Alcohol/week: 0.0 standard drinks of alcohol     Social History   Substance and Sexual Activity  Drug Use No    Social History   Socioeconomic History   Marital status: Married    Spouse name: Not on file   Number of children: Not on file   Years of education: Not on file   Highest education level: Not on file  Occupational History   Not on file  Tobacco Use   Smoking status: Never   Smokeless tobacco: Never  Vaping Use   Vaping status: Never Used  Substance and Sexual Activity   Alcohol use: No    Alcohol/week: 0.0 standard drinks of alcohol   Drug use: No   Sexual activity: Yes  Other Topics Concern   Not on file  Social History Narrative   Not on file   Social Drivers of Health   Tobacco Use: Low Risk (05/31/2024)   Patient History    Smoking Tobacco Use: Never    Smokeless Tobacco Use: Never    Passive Exposure: Not on file  Financial Resource Strain: Not on file  Food Insecurity: No Food Insecurity (05/31/2024)   Epic    Worried About Programme Researcher, Broadcasting/film/video in the Last Year: Never true    Ran Out of Food in the Last Year: Never true  Transportation Needs: No Transportation Needs (05/31/2024)   Epic    Lack of Transportation (Medical): No    Lack of Transportation (Non-Medical): No  Physical Activity: Not on file  Stress: Not on file  Social Connections: Not on file  Depression (PHQ2-9): Low Risk (01/27/2023)   Depression (PHQ2-9)    PHQ-2 Score: 0  Alcohol Screen: Low Risk (05/31/2024)   Alcohol Screen    Last Alcohol Screening Score (AUDIT): 0  Housing: Low Risk (05/31/2024)   Epic    Unable to Pay for Housing in the Last Year: No    Number of Times Moved in the Last Year: 0    Homeless in the Last Year: No  Utilities: Not At Risk (05/31/2024)   Epic    Threatened with loss of utilities: No   Health Literacy: Not on file   Past Medical History:  Past Medical History:  Diagnosis Date   Anemia    Depression    Schizophrenia (HCC)     Past Surgical History:  Procedure Laterality Date   NO PAST SURGERIES  Current Medications: Current Facility-Administered Medications  Medication Dose Route Frequency Provider Last Rate Last Admin   acetaminophen  (TYLENOL ) tablet 650 mg  650 mg Oral Q4H PRN Lord, Jamison Y, NP       alum & mag hydroxide-simeth (MAALOX/MYLANTA) 200-200-20 MG/5ML suspension 30 mL  30 mL Oral Q4H PRN Lord, Jamison Y, NP       ARIPiprazole  (ABILIFY ) tablet 30 mg  30 mg Oral QHS Marce Charlesworth, MD       Or   OLANZapine  (ZYPREXA ) injection 10 mg  10 mg Intramuscular QHS Haleigh Desmith, MD       benzocaine -Menthol  (DERMOPLAST) 20-0.5 % topical spray 1 Application  1 Application Topical PRN Lord, Jamison Y, NP       coconut oil  1 Application Topical PRN Jacquetta Sharlot GRADE, NP       witch hazel-glycerin  (TUCKS) pad 1 Application  1 Application Topical PRN Jacquetta Sharlot GRADE, NP       And   dibucaine (NUPERCAINAL) 1 % rectal ointment 1 Application  1 Application Rectal PRN Jacquetta Sharlot GRADE, NP       diphenhydrAMINE  (BENADRYL ) capsule 25 mg  25 mg Oral Q6H PRN Jacquetta Sharlot GRADE, NP       ferrous sulfate  tablet 325 mg  325 mg Oral Q1400 Jacquetta Sharlot GRADE, NP       ibuprofen  (ADVIL ) tablet 600 mg  600 mg Oral Q6H Jacquetta Sharlot GRADE, NP   600 mg at 06/11/24 9444   magnesium  hydroxide (MILK OF MAGNESIA) suspension 30 mL  30 mL Oral Daily PRN Jacquetta Sharlot GRADE, NP       OLANZapine  (ZYPREXA ) injection 5 mg  5 mg Intramuscular TID PRN Lord, Jamison Y, NP       prenatal multivitamin tablet 1 tablet  1 tablet Oral Q1200 Jacquetta Sharlot GRADE, NP       senna-docusate (Senokot-S) tablet 2 tablet  2 tablet Oral Daily Jacquetta Sharlot GRADE, NP       simethicone  (MYLICON) chewable tablet 80 mg  80 mg Oral PRN Lord, Jamison Y, NP       zolpidem  (AMBIEN ) tablet 5 mg  5 mg Oral QHS PRN Lord, Jamison Y, NP    5 mg at 06/13/24 2106    Lab Results: No results found for this or any previous visit (from the past 48 hours).  Blood Alcohol level:  Lab Results  Component Value Date   [facility] <15 11/29/2023   ETH <10 10/11/2017    Metabolic Disorder Labs: Lab Results  Component Value Date   HGBA1C 5.0 05/29/2024   MPG 96.8 05/29/2024   MPG 91.06 10/18/2017   Lab Results  Component Value Date   PROLACTIN 166.7 (H) 10/18/2017   Lab Results  Component Value Date   CHOL 311 (H) 05/29/2024   TRIG 196 (H) 05/29/2024   HDL 55 05/29/2024   CHOLHDL 5.7 05/29/2024   VLDL 39 05/29/2024   LDLCALC 217 (H) 05/29/2024   LDLCALC 102 (H) 10/18/2017    Physical Findings: AIMS:  , ,  ,  ,    CIWA:    COWS:      Psychiatric Specialty Exam:  Presentation  General Appearance:  Appropriate for Environment; Casual  Eye Contact: Fair  Speech: Clear and Coherent; Normal Rate  Speech Volume: Normal    Mood and Affect  Mood: Euthymic  Affect: Blunt   Thought Process  Thought Processes: Coherent; Linear Improving Orientation:Full (Time, Place and Person)  Thought Content:Delusions Resolving Hallucinations: Denies  Ideas of Reference:Delusions  Suicidal Thoughts DeniesHomicidal Thoughts: Denies   Sensorium  Memory: Immediate Fair; Recent Fair  Judgment: Poor  Insight: Lacking   Executive Functions  Concentration: Fair  Attention Span: Fair  Recall: [employer] of Knowledge: Fair  Language: Fair   Psychomotor Activity  Psychomotor Activity: No data recorded  Musculoskeletal: Strength & Muscle Tone: within normal limits Gait & Station: normal Assets  Assets: Manufacturing Systems Engineer; Housing; Social Support    Physical Exam: Physical Exam Vitals and nursing note reviewed.  Constitutional:      Appearance: Normal appearance.  Pulmonary:     Effort: Pulmonary effort is normal.  Neurological:     Mental Status: She is alert.    Review of  Systems  Respiratory:  Negative for shortness of breath.   Cardiovascular:  Negative for chest pain.  Gastrointestinal:  Negative for diarrhea, nausea and vomiting.  Psychiatric/Behavioral:  Negative for substance abuse and suicidal ideas.   All other systems reviewed and are negative.  Blood pressure 107/86, pulse 90, temperature 98.6 F (37 C), resp. rate 16, height 4' 4 (1.321 m), weight 54 kg, last menstrual period 08/04/2023, SpO2 100%, unknown if currently breastfeeding. Body mass index is 30.94 kg/m.  Diagnosis: Principal Problem:   Schizophrenia, undifferentiated (HCC)   PLAN: Safety and Monitoring:  -- Involuntary admission to inpatient psychiatric unit for safety, stabilization and treatment  -- Daily contact with patient to assess and evaluate symptoms and progress in treatment  -- Patient's case to be discussed in multi-disciplinary team meeting  -- Observation Level : q15 minute checks  -- Vital signs:  q12 hours  -- Precautions: suicide, elopement, and assault -- Encouraged patient to participate in unit milieu and in scheduled group therapies   2. Psychiatric Treatment:  Scheduled Medications: Zyprexa  was discontinued as LAI was not available and patient was not responding to it Abilify  was initiated and dosage is increased to 30 mg with the idea of giving her LAI 400 mg tomorrow Ambien  PRN sleep    -- The risks/benefits/side-effects/alternatives to this medication were discussed in detail with the patient and time was given for questions. The patient consents to medication trial.   3. Medical Issues Being Addressed:  Post Partum - patient declining vitamins/medications No medical concerns expressed  4. Discharge Planning:   -- Social work and case management to assist with discharge planning and identification of hospital follow-up needs prior to discharge  -- Estimated LOS: 5-7 days  Allyn Foil, MD 06/14/2024, 8:29 PM

## 2024-06-14 NOTE — BHH Counselor (Addendum)
 CSW spoke with patient's husband with the use of interpreter, [phone] to discuss need for family meeting and if the father can come to the hospital on Friday, 06/16/2023 to discuss medications.  Husband reported that he is working advertising account executive and provided times that coordinated with his work schedule.   Those times have been communicated to patient's care team to coordinate.   CSW to arrange meeting once time is agreed on.

## 2024-06-14 NOTE — Group Note (Signed)
 Date:  06/14/2024 Time:  10:10 AM  Group Topic/Focus:  Building Self Esteem:   The Focus of this group is helping patients become aware of the effects of self-esteem on their lives, the things they and others do that enhance or undermine their self-esteem, seeing the relationship between their level of self-esteem and the choices they make and learning ways to enhance self-esteem.    Participation Level:  Did Not Attend    Amy Mcclain Amy Mcclain 06/14/2024, 10:10 AM

## 2024-06-14 NOTE — BHH Counselor (Addendum)
 CSW attempted to call the patient's husband wit the use of interpreter, [phone] to discuss need for family meeting and if the father can come to the hospital on Friday, 06/16/2023 to discuss medications.   Husband did not answer. CSW left HIPAA compliant voicemail requesting a phone call.   Lenn Volker, MSW, LCSWA 06/14/2024 3:23 PM

## 2024-06-14 NOTE — Group Note (Signed)
 Date:  06/14/2024 Time:  8:52 PM  Group Topic/Focus:  Emotional Education:   The focus of this group is to discuss what feelings/emotions are, and how they are experienced.    Pt did not attend group.  Pistol Kessenich L 06/14/2024, 8:52 PM

## 2024-06-14 NOTE — Plan of Care (Signed)
" °  Problem: Education: Goal: Mental status will improve Outcome: Not Progressing Goal: Verbalization of understanding the information provided will improve Outcome: Not Progressing   Problem: Activity: Goal: Interest or engagement in activities will improve Outcome: Not Progressing   Problem: Coping: Goal: Ability to verbalize frustrations and anger appropriately will improve Outcome: Not Progressing   "

## 2024-06-14 NOTE — Group Note (Signed)
 [facility] LCSW Group Therapy Note   Group Date: 06/14/2024 Start Time: 1300 End Time: 1400   Type of Therapy/Topic:  Group Therapy:  Balance in Life  Participation Level:  Did Not Attend   Description of Group:    This group will address the concept of balance and how it feels and looks when one is unbalanced. Patients will be encouraged to process areas in their lives that are out of balance, and identify reasons for remaining unbalanced. Facilitators will guide patients utilizing problem- solving interventions to address and correct the stressor making their life unbalanced. Understanding and applying boundaries will be explored and addressed for obtaining  and maintaining a balanced life. Patients will be encouraged to explore ways to assertively make their unbalanced needs known to significant others in their lives, using other group members and facilitator for support and feedback.  Therapeutic Goals: Patient will identify two or more emotions or situations they have that consume much of in their lives. Patient will identify signs/triggers that life has become out of balance:  Patient will identify two ways to set boundaries in order to achieve balance in their lives:  Patient will demonstrate ability to communicate their needs through discussion and/or role plays  Summary of Patient Progress: Patient did not attend group.   Therapeutic Modalities:   Cognitive Behavioral Therapy Solution-Focused Therapy Assertiveness Training   Nadara JONELLE Fam, LCSW

## 2024-06-14 NOTE — Group Note (Signed)
 Recreation Therapy Group Note   Group Topic:Health and Wellness  Group Date: 06/14/2024 Start Time: 1530 End Time: 1610 Facilitators: Celestia Jeoffrey BRAVO, LRT, CTRS Location: Dayroom  Group Description: Seated Exercise. LRT discussed the mental and physical benefits of exercise. LRT and group discussed how physical activity can be used as a coping skill. Pt's and LRT followed along to an exercise video on the TV screen that provided a visual representation and audio description of every exercise performed. Pt's encouraged to listen to their bodies and stop at any time if they experience feelings of discomfort or pain. Pts were encouraged to drink water  and stay hydrated.   Goal Area(s) Addressed:  Patient will learn benefits of physical activity. Patient will identify exercise as a coping skill.  Patient will follow multistep directions. Patient will try a new leisure interest.    Affect/Mood: N/A   Participation Level: Did not attend    Clinical Observations/Individualized Feedback: Patient did not attend.  Plan: Continue to engage patient in RT group sessions 2-3x/week.   Jeoffrey BRAVO Celestia, LRT, CTRS 06/14/2024 5:06 PM

## 2024-06-15 MED ORDER — ARIPIPRAZOLE ER 400 MG IM PRSY: 400.0000 mg | PREFILLED_SYRINGE | INTRAMUSCULAR | 0 refills | Status: DC

## 2024-06-15 MED ORDER — ZOLPIDEM TARTRATE 5 MG PO TABS: 5.0000 mg | ORAL_TABLET | Freq: Every evening | ORAL | 0 refills | Status: DC | PRN

## 2024-06-15 MED ORDER — FERROUS SULFATE 325 (65 FE) MG PO TABS: 325.0000 mg | ORAL_TABLET | Freq: Every day | ORAL | 0 refills | Status: AC

## 2024-06-15 MED ORDER — ARIPIPRAZOLE ER 400 MG IM SRER
400.0000 mg | INTRAMUSCULAR | Status: AC
  Administered 2024-06-15: 400 mg via INTRAMUSCULAR
  Filled 2024-06-15: qty 2

## 2024-06-15 NOTE — Plan of Care (Signed)
   Problem: Education: Goal: Knowledge of [facility] Education information/materials will improve Outcome: Progressing Goal: Emotional status will improve Outcome: Progressing Goal: Mental status will improve Outcome: Progressing Goal: Verbalization of understanding the information provided will improve Outcome: Progressing   Problem: Activity: Goal: Interest or engagement in activities will improve Outcome: Progressing Goal: Sleeping patterns will improve Outcome: Progressing   Problem: Coping: Goal: Ability to verbalize frustrations and anger appropriately will improve Outcome: Progressing Goal: Ability to demonstrate self-control will improve Outcome: Progressing   Problem: Health Behavior/Discharge Planning: Goal: Identification of resources available to assist in meeting health care needs will improve Outcome: Progressing Goal: Compliance with treatment plan for underlying cause of condition will improve Outcome: Progressing   Problem: Physical Regulation: Goal: Ability to maintain clinical measurements within normal limits will improve Outcome: Progressing   Problem: Safety: Goal: Periods of time without injury will increase Outcome: Progressing   Problem: Education: Goal: Knowledge of General Education information will improve Description: Including pain rating scale, medication(s)/side effects and non-pharmacologic comfort measures Outcome: Progressing   Problem: Health Behavior/Discharge Planning: Goal: Ability to manage health-related needs will improve Outcome: Progressing   Problem: Clinical Measurements: Goal: Ability to maintain clinical measurements within normal limits will improve Outcome: Progressing Goal: Will remain free from infection Outcome: Progressing Goal: Diagnostic test results will improve Outcome: Progressing Goal: Respiratory complications will improve Outcome: Progressing Goal: Cardiovascular complication will be  avoided Outcome: Progressing   Problem: Activity: Goal: Risk for activity intolerance will decrease Outcome: Progressing   Problem: Nutrition: Goal: Adequate nutrition will be maintained Outcome: Progressing   Problem: Coping: Goal: Level of anxiety will decrease Outcome: Progressing   Problem: Elimination: Goal: Will not experience complications related to bowel motility Outcome: Progressing Goal: Will not experience complications related to urinary retention Outcome: Progressing   Problem: Pain Managment: Goal: General experience of comfort will improve and/or be controlled Outcome: Progressing   Problem: Safety: Goal: Ability to remain free from injury will improve Outcome: Progressing   Problem: Skin Integrity: Goal: Risk for impaired skin integrity will decrease Outcome: Progressing

## 2024-06-15 NOTE — Progress Notes (Signed)
 Patient compliant with Abilify  Maintana with show of support. Education provided via interpretor. Support provided as needed. Will continue to monitor.

## 2024-06-15 NOTE — Discharge Instructions (Signed)
 Review and take meds as ordered.

## 2024-06-15 NOTE — Plan of Care (Signed)
   Problem: Education: Goal: Emotional status will improve Outcome: Progressing Goal: Mental status will improve Outcome: Progressing Goal: Verbalization of understanding the information provided will improve Outcome: Progressing

## 2024-06-15 NOTE — Progress Notes (Signed)
 Patient tolerated Abilify  injection. Dr. JINNY stated that if patient is still stable without any problems then she will discharge the patient this evening.

## 2024-06-15 NOTE — Group Note (Signed)
 Recreation Therapy Group Note   Group Topic:Leisure Education  Group Date: 06/15/2024 Start Time: 1030 End Time: 1140 Facilitators: Celestia Jeoffrey BRAVO, LRT, CTRS Location: Craft Room  Group Description: Leisure. Patients were given the option to choose from journaling, coloring, drawing, making origami, playing with playdoh, listening to music or singing karaoke. LRT and pts discussed the meaning of leisure, the importance of participating in leisure during their free time/when they're outside of the hospital, as well as how our leisure interests can also serve as coping skills.   Goal Area(s) Addressed:  Patient will identify a current leisure interest.  Patient will learn the definition of leisure. Patient will practice making a positive decision. Patient will have the opportunity to try a new leisure activity. Patient will communicate with peers and LRT.    Affect/Mood: N/A   Participation Level: Did not attend    Clinical Observations/Individualized Feedback: Patient did not attend.   Plan: Continue to engage patient in RT group sessions 2-3x/week.   Jeoffrey BRAVO Celestia, LRT, CTRS 06/15/2024 11:45 AM

## 2024-06-15 NOTE — Group Note (Signed)
 Recreation Therapy Group Note   Group Topic:Stress Management  Group Date: 06/15/2024 Start Time: 1530 End Time: 1610 Facilitators: Celestia Jeoffrey BRAVO, LRT, CTRS Location: Dayroom  Group Description: Taboo. LRT and patients played the game Taboo. The object of the game is to have peers guess the word up at the top of the card drawn that is in bold, while being sure to not use any of the descriptive words down below it on the same card. If the person attempting to explain the word in bold uses any of the descriptive words down below, they lose their turn, and no one receives that card or point. LRT and patient's took turns being the one to describe the words while the rest of the group tried to guess what they were describing.   Goal Area(s) Addressed: Patient will identify physical symptoms of stress. Patient will identify emotional symptoms of stress. Patient will identify coping skills for stress. Patient will build frustration tolerance skills.  Patient will increase communication.   Affect/Mood: N/A   Participation Level: Did not attend    Clinical Observations/Individualized Feedback: Patient did not attend.  Plan: Continue to engage patient in RT group sessions 2-3x/week.   Jeoffrey BRAVO Celestia, LRT, CTRS 06/15/2024 4:46 PM

## 2024-06-15 NOTE — BHH Counselor (Signed)
 Arabic translator request made for family meeting today, 06/15/24 at 3 PM.   Demetrice Amstutz, MSW, [facility] 06/15/2024 9:56 AM

## 2024-06-15 NOTE — Progress Notes (Signed)
" °  [facility] Adult Case Management Discharge Plan :  Will you be returning to the same living situation after discharge:  Yes,  pt to return to home At discharge, do you have transportation home?: Yes,  pt's family will provide transportaiton.  Do you have the ability to pay for your medications: Yes,   [employer] TAILORED PLAN / TRILLI[employer] TAILORED PLAN  Release of information consent forms completed and in the chart;  Patient's signature needed at discharge.  Patient to Follow up at:  Follow-up Information     [facility] Follow up.   Specialty: Urgent Care Why: Appointment is scheduled for Dr. Harl, due to being considered a new patient, you may have to wait to see Lytle Bolster again, Jan 26th at 11AM.  Appointment is face to face. Contact information: [address] [place]  [address] [phone]                Next level of care provider has access to [facility] Link:no  Safety Planning and Suicide Prevention discussed: Yes,  SPE completed with the patient's husband.      Has patient been referred to the Quitline?: Patient does not use tobacco/nicotine products  Patient has been referred for addiction treatment: No known substance use disorder.  Sherryle JINNY Margo, LCSW 06/15/2024, 3:54 PM "

## 2024-06-15 NOTE — Progress Notes (Signed)
 Patient remained stable. Discharge instructions given to patient's daughter (Amy Mcclain) in the present of patient d/t patient not speaking english. Patient did not have any personal belongs in the locker and the few things she had in the room was given to her.  Patient left the unit ambulatory escorted by her daughter Amy Mcclain, the interpretor and one of our MHTs

## 2024-06-15 NOTE — BHH Suicide Risk Assessment (Signed)
 [facility] Discharge Suicide Risk Assessment   Principal Problem: Schizophrenia, undifferentiated (HCC) Discharge Diagnoses: Principal Problem:   Schizophrenia, undifferentiated (HCC)   Total Time spent with patient: 30 minutes  Musculoskeletal: Strength & Muscle Tone: within normal limits Gait & Station: normal Patient leans: N/A  Psychiatric Specialty Exam  Presentation  General Appearance:  Appropriate for Environment  Eye Contact: Fair  Speech: Clear and Coherent  Speech Volume: Normal  Handedness: Right   Mood and Affect  Mood: Anxious  Duration of Depression Symptoms: No data recorded Affect: Full Range   Thought Process  Thought Processes: Coherent  Descriptions of Associations:Intact  Orientation:Full (Time, Place and Person)  Thought Content:Illogical; Delusions  History of Schizophrenia/Schizoaffective disorder:Yes  Duration of Psychotic Symptoms:Greater than six months  Hallucinations:Hallucinations: None  Ideas of Reference:None  Suicidal Thoughts:Suicidal Thoughts: No  Homicidal Thoughts:Homicidal Thoughts: No   Sensorium  Memory: Immediate Fair; Recent Fair  Judgment: Poor  Insight: Shallow   Executive Functions  Concentration: Fair  Attention Span: Fair  Recall: [employer] of Knowledge: Fair  Language: Fair   Psychomotor Activity  Psychomotor Activity: Psychomotor Activity: Normal   Assets  Assets: Communication Skills; Social Support   Sleep  Sleep: Sleep: Fair  Estimated Sleeping Duration (Last 24 Hours): 5.00-6.25 hours  Physical Exam: Physical Exam ROS Blood pressure 138/73, pulse (!) 104, temperature 98.4 F (36.9 C), temperature source Oral, resp. rate 14, height 4' 4 (1.321 m), weight 54 kg, last menstrual period 08/04/2023, SpO2 100%, unknown if currently breastfeeding. Body mass index is 30.94 kg/m.  Mental Status Per Nursing Assessment::   On Admission:  NA  Demographic Factors:   Low socioeconomic status  Loss Factors: Decrease in vocational status  Historical Factors: Impulsivity  Risk Reduction Factors:   Living with another person, especially a relative, Positive social support, Positive therapeutic relationship, and Positive coping skills or problem solving skills  Continued Clinical Symptoms:  Schizophrenia:   Paranoid or undifferentiated type  Cognitive Features That Contribute To Risk:  None    Suicide Risk:  Minimal: No identifiable suicidal ideation.  Patients presenting with no risk factors but with morbid ruminations; may be classified as minimal risk based on the severity of the depressive symptoms   Follow-up Information     [facility] Follow up.   Specialty: Urgent Care Why: Appointment is scheduled for Dr. Harl, due to being considered a new patient, you may have to wait to see Lytle Bolster again, Jan 26th at 11AM.  Appointment is face to face. Contact information: [address] [place]  [phone]                Plan Of Care/Follow-up recommendations:  Activity:  as tolerated  Allyn Foil, MD 06/15/2024, 4:27 PM

## 2024-06-15 NOTE — Discharge Summary (Signed)
 " Physician Discharge Summary Note  Patient:  Amy Mcclain is an 39 y.o., female MRN:  [id number] DOB:  1986-05-03 Patient phone:  [phone] (home)  Patient address:   [address] [place] [facility] [address],   Total time spent: 40 min Date of Admission:  05/31/2024 Date of Discharge: 06/15/24  Reason for Admission:  s. Amy Mcclain is a 37 year old postpartum female with a known history of schizophrenia, undifferentiated type, admitted following home delivery of an infant and subsequent psychiatric consultation due to psychotic decompensation, medication noncompliance, refusal of medical care, and impaired capacity   Principal Problem: Schizophrenia, undifferentiated (HCC) Discharge Diagnoses: Principal Problem:   Schizophrenia, undifferentiated (HCC)   Past Psychiatric History: see h&p  Family Psychiatric  History: see h&p Social History:  Social History   Substance and Sexual Activity  Alcohol Use No   Alcohol/week: 0.0 standard drinks of alcohol     Social History   Substance and Sexual Activity  Drug Use No    Social History   Socioeconomic History   Marital status: Married    Spouse name: Not on file   Number of children: Not on file   Years of education: Not on file   Highest education level: Not on file  Occupational History   Not on file  Tobacco Use   Smoking status: Never   Smokeless tobacco: Never  Vaping Use   Vaping status: Never Used  Substance and Sexual Activity   Alcohol use: No    Alcohol/week: 0.0 standard drinks of alcohol   Drug use: No   Sexual activity: Yes  Other Topics Concern   Not on file  Social History Narrative   Not on file   Social Drivers of Health   Tobacco Use: Low Risk (05/31/2024)   Patient History    Smoking Tobacco Use: Never    Smokeless Tobacco Use: Never    Passive Exposure: Not on file  Financial Resource Strain: Not on file  Food Insecurity: No Food Insecurity (05/31/2024)   Epic    Worried About Patent Examiner in the Last Year: Never true    Ran Out of Food in the Last Year: Never true  Transportation Needs: No Transportation Needs (05/31/2024)   Epic    Lack of Transportation (Medical): No    Lack of Transportation (Non-Medical): No  Physical Activity: Not on file  Stress: Not on file  Social Connections: Not on file  Depression (PHQ2-9): Low Risk (01/27/2023)   Depression (PHQ2-9)    PHQ-2 Score: 0  Alcohol Screen: Low Risk (05/31/2024)   Alcohol Screen    Last Alcohol Screening Score (AUDIT): 0  Housing: Low Risk (05/31/2024)   Epic    Unable to Pay for Housing in the Last Year: No    Number of Times Moved in the Last Year: 0    Homeless in the Last Year: No  Utilities: Not At Risk (05/31/2024)   Epic    Threatened with loss of utilities: No  Health Literacy: Not on file   Past Medical History:  Past Medical History:  Diagnosis Date   Anemia    Depression    Schizophrenia (HCC)     Past Surgical History:  Procedure Laterality Date   NO PAST SURGERIES     Family History:  Family History  Problem Relation Age of Onset   Hypertension Mother    Diabetes Mother    Diabetes Father    Hypertension Father     Hospital Course:  s. Amy Mcclain is a 39 year old postpartum female with a known history of schizophrenia, undifferentiated type, admitted following home delivery of an infant and subsequent psychiatric consultation due to psychotic decompensation, medication noncompliance, refusal of medical care, and impaired capacity Patient is admitted to  psych unit with Q15 min safety monitoring. Multidisciplinary team approach is offered. Medication management; group/milieu therapy is offered.   On admission, patient was started on Zyprexa  which the dosage was titrated up to 10 mg twice daily.  Patient had to be initiated on nonemergent forced medication approved by 2 physicians initially.  She started taking the medications consistently without requiring any IM injections.  Patient did  not respond well to Zyprexa  and remained delusional.  Patient's family that includes her husband and 71 year old daughter expressed a concern that patient is not safe to go home with a newborn baby given the delusions and her noncompliance with medications.  Zyprexa  was switched to Abilify  and she was given a 30 mg dosage followed by LAI Abilify  maintainer 400 mg on 06/15/2024 in the presence of her husband and her 56 year old daughter.  Patient tolerated the LAI with no difficulty.  She was monitored for next few hours and as patient had no side effects patient is discharged home with her family with outpatient follow-up.  Detailed risk assessment is complete based on clinical exam and individual risk factors and acute suicide risk is low and acute violence risk is low.    On the day of discharge, patient denies SI/HI/plan and denies hallucinations.  Patient remains future oriented and is willing to participate in outpatient mental health services.  Currently, all modifiable risk of harm to self/harm to others have been addressed and patient is no longer appropriate for the acute inpatient setting and is able to continue treatment for mental health needs in the community with the supports as indicated below.  Patient is educated and verbalized understanding of discharge plan of care including medications, follow-up appointments, mental health resources and further crisis services in the community.  He is instructed to call 911 or present to the nearest emergency room should he experience any decompensation in mood, disturbance of bowel or return of suicidal/homicidal ideations.  Patient verbalizes understanding of this education and agrees to this plan of care  Physical Findings: AIMS:  , ,  ,  ,    CIWA:    COWS:      Psychiatric Specialty Exam:  Presentation  General Appearance:  Appropriate for Environment  Eye Contact: Fair  Speech: Clear and Coherent  Speech Volume: Normal    Mood  and Affect  Mood: Anxious  Affect: Full Range   Thought Process  Thought Processes: Coherent  Descriptions of Associations:Intact  Orientation:Full (Time, Place and Person)  Thought Content: Chronic delusions Hallucinations:Hallucinations: None  Ideas of Reference:None  Suicidal Thoughts:Suicidal Thoughts: No  Homicidal Thoughts:Homicidal Thoughts: No   Sensorium  Memory: Immediate Fair; Recent Fair  Judgment: Poor  Insight: Shallow   Executive Functions  Concentration: Fair  Attention Span: Fair  Recall: Fair  Fund of Knowledge: Fair  Language: Fair   Psychomotor Activity  Psychomotor Activity: Psychomotor Activity: Normal  Musculoskeletal: Strength & Muscle Tone: within normal limits Gait & Station: normal Assets  Assets: Manufacturing Systems Engineer; Social Support   Sleep  Sleep: Sleep: Fair    Physical Exam: Physical Exam Vitals and nursing note reviewed.    ROS Blood pressure 138/73, pulse (!) 104, temperature 98.4 F (36.9 C), temperature source Oral, resp. rate 14, height 4'  4 (1.321 m), weight 54 kg, last menstrual period 08/04/2023, SpO2 100%, unknown if currently breastfeeding. Body mass index is 30.94 kg/m.   Tobacco Use History[1] Tobacco Cessation:  N/A, patient does not currently use tobacco products   Blood Alcohol level:  Lab Results  Component Value Date   [facility] <15 11/29/2023   ETH <10 10/11/2017    Metabolic Disorder Labs:  Lab Results  Component Value Date   HGBA1C 5.0 05/29/2024   MPG 96.8 05/29/2024   MPG 91.06 10/18/2017   Lab Results  Component Value Date   PROLACTIN 166.7 (H) 10/18/2017   Lab Results  Component Value Date   CHOL 311 (H) 05/29/2024   TRIG 196 (H) 05/29/2024   HDL 55 05/29/2024   CHOLHDL 5.7 05/29/2024   VLDL 39 05/29/2024   LDLCALC 217 (H) 05/29/2024   LDLCALC 102 (H) 10/18/2017    See Psychiatric Specialty Exam and Suicide Risk Assessment completed by Attending  Physician prior to discharge.  Discharge destination:  Home  Is patient on multiple antipsychotic therapies at discharge:  No   Has Patient had three or more failed trials of antipsychotic monotherapy by history:  No  Recommended Plan for Multiple Antipsychotic Therapies: NA   Allergies as of 06/15/2024       Reactions   Ceftin [cefuroxime Axetil] Rash   bruising        Medication List     STOP taking these medications    ibuprofen  600 MG tablet Commonly known as: ADVIL    lurasidone 20 MG Tabs tablet Commonly known as: LATUDA   OLANZapine  5 MG tablet Commonly known as: ZYPREXA    Prenatal 27-1 MG Tabs       TAKE these medications      Indication  ARIPiprazole  ER 400 MG Prsy prefilled syringe Commonly known as: ABILIFY  MAINTENA Inject 400 mg into the muscle every 28 (twenty-eight) days. Next dose 07/15/24  Indication: Manic-Depression   ferrous sulfate  325 (65 FE) MG tablet Take 1 tablet (325 mg total) by mouth daily at 2 PM. Start taking on: June 16, 2024  Indication: Anemia From Inadequate Iron in the Body   zolpidem  5 MG tablet Commonly known as: AMBIEN  Take 1 tablet (5 mg total) by mouth at bedtime as needed for sleep.  Indication: Trouble Sleeping        Follow-up Information     [facility] Follow up.   Specialty: Urgent Care Why: Appointment is scheduled for Dr. Harl, due to being considered a new patient, you may have to wait to see Lytle Bolster again, Jan 26th at 11AM.  Appointment is face to face. Contact information: [address] [place]  [phone]                Follow-up recommendations:  Activity:  as tolerated    Signed: Chelli Yerkes, MD 06/15/2024, 4:28 PM          [1]  Social History Tobacco Use  Smoking Status Never  Smokeless Tobacco Never   "

## 2024-06-15 NOTE — Group Note (Signed)
 Date:  06/15/2024 Time:  11:55 AM  Group Topic/Focus:  Goals Group:   The focus of this group is to help patients establish daily goals to achieve during treatment and discuss how the patient can incorporate goal setting into their daily lives to aide in recovery.    Participation Level:  Did Not Attend   Amy Mcclain June 06/15/2024, 11:55 AM

## 2024-06-20 ENCOUNTER — Encounter (HOSPITAL_COMMUNITY): Payer: Self-pay

## 2024-06-25 ENCOUNTER — Ambulatory Visit (INDEPENDENT_AMBULATORY_CARE_PROVIDER_SITE_OTHER): Payer: MEDICAID | Admitting: Psychiatry

## 2024-06-25 ENCOUNTER — Encounter (HOSPITAL_COMMUNITY): Payer: Self-pay | Admitting: Psychiatry

## 2024-06-25 DIAGNOSIS — F203 Undifferentiated schizophrenia: Secondary | ICD-10-CM | POA: Diagnosis not present

## 2024-06-25 MED ORDER — ARIPIPRAZOLE ER 400 MG IM PRSY: 400.0000 mg | PREFILLED_SYRINGE | INTRAMUSCULAR | 11 refills | Status: AC

## 2024-06-25 MED ORDER — ZOLPIDEM TARTRATE 5 MG PO TABS: 5.0000 mg | ORAL_TABLET | Freq: Every evening | ORAL | 3 refills | Status: AC | PRN

## 2024-06-25 NOTE — Progress Notes (Signed)
 " Psychiatric Initial Adult Assessment  Virtual Visit via Video Note  I connected with Amy Mcclain on 06/25/24 at 11:00 AM EST by a video enabled telemedicine application and verified that I am speaking with the correct person using two identifiers.  Location: Patient: Home Provider: Clinic   I discussed the limitations of evaluation and management by telemedicine and the availability of in person appointments. The patient expressed understanding and agreed to proceed.  I provided 45 minutes of non-face-to-face time during this encounter.    Patient Identification: Titiana Severa MRN:  [id number] Date of Evaluation:  06/25/2024 Referral Source: [facility] Chief Complaint:  I feel better Per daughter My dad and I want her to continue the shot Visit Diagnosis:    ICD-10-CM   1. 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 She was referred to outpatient pscyhiatru by [facility] where she presented on 05/31/2024-06/15/2024. She has a psychiatric history of Unspecified schizophrenia. She is managed on Abilify  Manitena 400 mg montly. Her last injection was on 06/16/2023. She notes that she does not wish to continue her medications.   Today she is well groomed, pleasant, cooperative and engaged in conversation. Provider utilized an Sports Coach speaking interpreter as this is her primary langage. Today patient notes that she is feeling better. She reports that she no longer wants to get her LAI. She notes that she disliked being forced to take the LAI. Patient daughter notes that her mother is not compliant with oral medications. She notes that her mother does not take her oral ambien .  Since her hospitalization patient notes that she has been staying active. She reports that she walks to help cope with stressors. She  informed clinical research associate that her mood is stable and notes that she has minimal anxiety and depression. Today provider conducted a GAD 7 and patient scored a 0. Provider also conducted a PHQ 9 and patient scored a 4. She notes that her sleep is adequate. Today she denies VAH. She does note that she does have olfactory hllucinations. Patients daughter notes that she is paranoid at times. She notes that she feels that the neighbors are out to get her. Her daughter notes that she complains of smelling blood, pee, and smoke. She also notes that at times she hits surfaces. Per daughter since reciving Abilify  her mood has improvided.  She reports that she is less irritable, is able to enjoy gifts, and socialize better with the family.  She denies alcohol, tobacco, or illegal drug use.  Patient reports that she will consider taking her LAI in February.  She notes that she wants to discuss it more with her family.  Abilify  maintainer sent to preferred pharmacy.  No other concerns at this time. Associated Signs/Symptoms: Depression Symptoms:  insomnia, fatigue, difficulty concentrating, (Hypo) Manic Symptoms:  Hallucinations, Anxiety Symptoms:  Excessive Worry, Psychotic Symptoms:  Hallucinations: Olfactory Paranoia, PTSD Symptoms: NA  Past Psychiatric History: Unspecified Schizophrenia  Previous Psychotropic Medications: Zyprexa , abilify , ambien   Substance Abuse History in the last 12 months:  No.  Consequences of Substance Abuse: NA  Past Medical History:  Past Medical History:  Diagnosis Date   Anemia    Depression    Schizophrenia (HCC)     Past Surgical History:  Procedure Laterality Date   NO PAST SURGERIES  Family Psychiatric History: Denies  Family History:  Family History  Problem Relation Age of Onset   Hypertension Mother    Diabetes Mother    Diabetes Father    Hypertension Father     Social History:   Social History   Socioeconomic History   Marital status: Married     Spouse name: Not on file   Number of children: Not on file   Years of education: Not on file   Highest education level: Not on file  Occupational History   Not on file  Tobacco Use   Smoking status: Never   Smokeless tobacco: Never  Vaping Use   Vaping status: Never Used  Substance and Sexual Activity   Alcohol use: No    Alcohol/week: 0.0 standard drinks of alcohol   Drug use: No   Sexual activity: Yes  Other Topics Concern   Not on file  Social History Narrative   Not on file   Social Drivers of Health   Tobacco Use: Low Risk (05/31/2024)   Patient History    Smoking Tobacco Use: Never    Smokeless Tobacco Use: Never    Passive Exposure: Not on file  Financial Resource Strain: Not on file  Food Insecurity: No Food Insecurity (05/31/2024)   Epic    Worried About Programme Researcher, Broadcasting/film/video in the Last Year: Never true    Ran Out of Food in the Last Year: Never true  Transportation Needs: No Transportation Needs (05/31/2024)   Epic    Lack of Transportation (Medical): No    Lack of Transportation (Non-Medical): No  Physical Activity: Not on file  Stress: Not on file  Social Connections: Not on file  Depression (PHQ2-9): Low Risk (06/25/2024)   Depression (PHQ2-9)    PHQ-2 Score: 4  Alcohol Screen: Low Risk (05/31/2024)   Alcohol Screen    Last Alcohol Screening Score (AUDIT): 0  Housing: Low Risk (05/31/2024)   Epic    Unable to Pay for Housing in the Last Year: No    Number of Times Moved in the Last Year: 0    Homeless in the Last Year: No  Utilities: Not At Risk (05/31/2024)   Epic    Threatened with loss of utilities: No  Health Literacy: Not on file    Additional Social History: Patient resides in [place] with her husband and 5 children. She is unemployed. She denies tobacco, alcohol, or illegal drug use.   Allergies:  Allergies[1]  Metabolic Disorder Labs: Lab Results  Component Value Date   HGBA1C 5.0 05/29/2024   MPG 96.8 05/29/2024   MPG 91.06 10/18/2017    Lab Results  Component Value Date   PROLACTIN 166.7 (H) 10/18/2017   Lab Results  Component Value Date   CHOL 311 (H) 05/29/2024   TRIG 196 (H) 05/29/2024   HDL 55 05/29/2024   CHOLHDL 5.7 05/29/2024   VLDL 39 05/29/2024   LDLCALC 217 (H) 05/29/2024   LDLCALC 102 (H) 10/18/2017   Lab Results  Component Value Date   TSH 1.713 10/18/2017    Therapeutic Level Labs: No results found for: LITHIUM No results found for: CBMZ No results found for: VALPROATE  Current Medications: Current Outpatient Medications  Medication Sig Dispense Refill   ARIPiprazole  ER (ABILIFY  MAINTENA) 400 MG PRSY prefilled syringe Inject 400 mg into the muscle every 28 (twenty-eight) days. Next dose 07/15/24 1 each 11   ferrous sulfate  325 (65 FE) MG tablet Take 1 tablet (325 mg total) by mouth  daily at 2 PM. 30 tablet 0   zolpidem  (AMBIEN ) 5 MG tablet Take 1 tablet (5 mg total) by mouth at bedtime as needed for sleep. 30 tablet 3   No current facility-administered medications for this visit.    Musculoskeletal: Strength & Muscle Tone: within normal limits and Telehealth visit Gait & Station: normal, Telehealth visit Patient leans: N/A  Psychiatric Specialty Exam: Review of Systems  Last menstrual period 08/04/2023, unknown if currently breastfeeding.There is no height or weight on file to calculate BMI.  General Appearance: Well Groomed  Eye Contact:  Good  Speech:  Clear and Coherent and Normal Rate  Volume:  Normal  Mood:  Euthymic  Affect:  Appropriate and Congruent  Thought Process:  Coherent, Goal Directed, and Linear  Orientation:  Full (Time, Place, and Person)  Thought Content:  WDL and Logical  Suicidal Thoughts:  No  Homicidal Thoughts:  No  Memory:  Immediate;   Good Recent;   Good Remote;   Good  Judgement:  Good  Insight:  Good  Psychomotor Activity:  Normal  Concentration:  Concentration: Good and Attention Span: Good  Recall:  Good  Fund of Knowledge:Good   Language: Good  Akathisia:  No  Handed:  Right  AIMS (if indicated):  not done  Assets:  Communication Skills Desire for Improvement Housing Intimacy Leisure Time Physical Health Social Support Transportation  ADL's:  Intact  Cognition: WNL  Sleep:  Good   Screenings: AUDIT    Flowsheet Row Admission (Discharged) from 05/31/2024 in [facility]  Alcohol Use Disorder Identification Test Final Score (AUDIT) 0 0   GAD-7    Flowsheet Row Office Visit from 06/25/2024 in [facility] Clinical Support from 01/27/2023 in [facility] Clinical Support from 11/17/2022 in [facility] Office Visit from 08/27/2022 in [facility] Office Visit from 02/17/2021 in [facility]  Total GAD-7 Score 0 0 0 5 0   PHQ2-9    Flowsheet Row Office Visit from 06/25/2024 in [facility] Clinical Support from 01/27/2023 in [facility] Clinical Support from 11/17/2022 in [facility] Office Visit from 08/27/2022 in [facility] Office Visit from 02/17/2021 in [facility]  PHQ-2 Total Score 0 0 0 1 0  PHQ-9 Total Score 4 -- -- -- --   Flowsheet Row Admission (Discharged) from 05/31/2024 in [facility] INPATIENT BEHAVIORAL MEDICINE ED from 11/29/2023 in [facility] Emergency Department at [facility] Clinical Support from 01/27/2023 in [facility]  C-SSRS RISK CATEGORY No Risk No Risk No Risk    Assessment and Plan: Today patient notes that her anxiety, depression, are well managed. She denies symptoms of psychosis. Patient notes that she she does not want to continue her LAI but notes that she will consider  it. Medication sent to her preferred pharmacy.   1. Schizophrenia, undifferentiated (HCC) (Primary)  Continue- ARIPiprazole  ER (ABILIFY  MAINTENA) 400 MG PRSY prefilled syringe; Inject 400 mg into the muscle every 28 (twenty-eight) days. Next dose 07/15/24  Dispense: 1 each; Refill: 11 Continue- zolpidem  (AMBIEN ) 5 MG tablet; Take 1 tablet (5 mg total) by mouth at bedtime as needed for sleep.  Dispense: 30 tablet; Refill: 3   Collaboration of Care: Other provider involved in patient's care AEB PCP and  shot clinic staff  Patient/Guardian was advised Release of Information must be obtained prior to any record release in order to collaborate their care with an outside provider. Patient/Guardian was advised if they have not already done so to contact the registration department to sign all necessary forms in order for us  to release information regarding their care.   Consent: Patient/Guardian gives verbal consent for treatment and assignment of benefits for services provided during this visit. Patient/Guardian expressed understanding and agreed to proceed.   Follow up in 1 month with shot clinic Follow up in 2.5 months   Zane FORBES Bach, NP 1/26/202611:58 AM      [1]  Allergies Allergen Reactions   Ceftin [Cefuroxime Axetil] Rash    bruising   "

## 2024-06-26 ENCOUNTER — Encounter (HOSPITAL_COMMUNITY): Payer: Self-pay

## 2024-07-02 ENCOUNTER — Ambulatory Visit: Payer: Self-pay | Admitting: Obstetrics & Gynecology

## 2024-07-12 ENCOUNTER — Ambulatory Visit: Payer: MEDICAID | Admitting: Family Medicine

## 2024-07-17 ENCOUNTER — Ambulatory Visit (HOSPITAL_COMMUNITY): Payer: MEDICAID

## 2024-08-29 ENCOUNTER — Telehealth (HOSPITAL_COMMUNITY): Payer: MEDICAID | Admitting: Psychiatry
